# Patient Record
Sex: Male | Born: 1965 | Hispanic: Yes | Marital: Married | Smoking: Never smoker
Health system: Southern US, Community
[De-identification: ages and names within clinical notes are randomized; demographics above are authoritative.]

## PROBLEM LIST (undated history)

## (undated) DIAGNOSIS — Z9289 Personal history of other medical treatment: Secondary | ICD-10-CM

## (undated) DIAGNOSIS — M1990 Unspecified osteoarthritis, unspecified site: Secondary | ICD-10-CM

## (undated) DIAGNOSIS — Z87442 Personal history of urinary calculi: Secondary | ICD-10-CM

## (undated) DIAGNOSIS — E119 Type 2 diabetes mellitus without complications: Secondary | ICD-10-CM

## (undated) DIAGNOSIS — F419 Anxiety disorder, unspecified: Secondary | ICD-10-CM

## (undated) DIAGNOSIS — D649 Anemia, unspecified: Secondary | ICD-10-CM

## (undated) DIAGNOSIS — Z7689 Persons encountering health services in other specified circumstances: Secondary | ICD-10-CM

## (undated) DIAGNOSIS — E785 Hyperlipidemia, unspecified: Secondary | ICD-10-CM

## (undated) HISTORY — DX: Hyperlipidemia, unspecified: E78.5

## (undated) HISTORY — DX: Anemia, unspecified: D64.9

## (undated) HISTORY — PX: COLONOSCOPY: SHX174

## (undated) HISTORY — DX: Type 2 diabetes mellitus without complications: E11.9

---
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
## 2006-10-13 HISTORY — PX: COLONOSCOPY: SHX174

## 2009-02-01 LAB — HM COLONOSCOPY: HM Colonoscopy: NORMAL

## 2010-10-13 HISTORY — PX: BACK SURGERY: SHX140

## 2014-10-13 HISTORY — PX: BACK SURGERY: SHX140

## 2015-01-02 ENCOUNTER — Ambulatory Visit: Payer: Medicare Other | Attending: Orthopaedic Surgery | Admitting: Physical Therapy

## 2015-01-02 DIAGNOSIS — M542 Cervicalgia: Secondary | ICD-10-CM | POA: Insufficient documentation

## 2015-01-02 DIAGNOSIS — R531 Weakness: Secondary | ICD-10-CM | POA: Insufficient documentation

## 2015-01-02 NOTE — Patient Instructions (Signed)
AROM: Lateral Neck Flexion   Slowly tilt head toward one shoulder, then the other. Hold each position __20__ seconds. Repeat _2-3___ times per set. Do __1__ sets per session. Do _2-3___ sessions per day.  http://orth.exer.us/296   Copyright  VHI. All rights reserved.   Levator Stretch   Grasp seat or sit on hand on side to be stretched. Turn head toward other side and look down. Use hand on head to gently stretch neck in that position. Hold _20___ seconds. Repeat on other side. Repeat __2-3__ times. Do _2-3___ sessions per day.  http://gt2.exer.us/30   Copyright  VHI. All rights reserved.   Laureen Abrahams, PT, DPT 01/02/2015 2:56 PM  [place], [place] [address]  [phone] (office) [phone] (fax)
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
## 2015-01-04 ENCOUNTER — Ambulatory Visit: Payer: Medicare Other | Admitting: Physical Therapy

## 2015-01-04 DIAGNOSIS — R531 Weakness: Secondary | ICD-10-CM

## 2015-01-04 DIAGNOSIS — M542 Cervicalgia: Secondary | ICD-10-CM

## 2015-01-04 NOTE — Therapy (Signed)
[place], [place], [address] Phone: [phone]   Fax:  [phone]  Physical Therapy Evaluation  Patient Details  Name: Richard Noble MRN: [id number] Date of Birth: 1966-08-27 Referring Provider:  Marybelle Killings, MD  Encounter Date: 01/04/2015      PT End of Session - 01/04/15 0845    Visit Number 2   Number of Visits 4   Date for PT Re-Evaluation 02/01/15   PT Start Time 0800   PT Stop Time 0901   PT Time Calculation (min) 61 min   Activity Tolerance Patient tolerated treatment well   Behavior During Therapy [facility] for tasks assessed/performed      No past medical history on file.  No past surgical history on file.  There were no vitals filed for this visit.  Visit Diagnosis:  Pain in neck  Generalized weakness      Subjective Assessment - 01/04/15 0803    Symptoms Less pain this AM; about a 3/10.     Patient Stated Goals improve muscles spasms, sleep through night   Currently in Pain? Yes   Pain Score 3    Pain Location Neck   Pain Orientation Left;Posterior                       [facility] Adult PT Treatment/Exercise - 01/04/15 0804    Neck Exercises: Machines for Strengthening   UBE (Upper Arm Bike) Level 2.5 x 8 min alt 2 min forward/2 min backward   Neck Exercises: Seated   Shoulder Rolls Backwards;15 reps   Other Seated Exercise scap retraction x 15   Neck Exercises: Supine   Neck Retraction 15 reps;3 secs   Upper Extremity Flexion with Stabilization Flexion;10 reps   UE Flexion with Stabilization Limitations yellow theraband   Modalities   Modalities Traction;Electrical Stimulation;Moist Heat   Moist Heat Therapy   Number Minutes Moist Heat 15 Minutes   Moist Heat Location Other (comment)  neck   Electrical Stimulation   Electrical Stimulation Location L neck   Electrical Stimulation Action IFC to tolerance   Electrical Stimulation Goals Pain   Traction   Type of Traction Cervical   Min (lbs) 18   Max (lbs) 18   Hold Time static   Time 15   Neck Exercises: Stretches   Upper Trapezius Stretch 2 reps;20 seconds   Levator Stretch 2 reps;20 seconds                PT Education - 01/04/15 0845    Education provided Yes   Education Details HEP   Person(s) Educated Patient   Methods Explanation;Handout;Demonstration   Comprehension Verbalized understanding;Need further instruction;Returned demonstration             PT Long Term Goals - 01/04/15 0846    PT LONG TERM GOAL #1   Title independent with HEP (01/30/15)   Status On-going   PT LONG TERM GOAL #2   Title verbalize understanding of posture/body mechanics to reduce risk of reinjury (01/30/15)   Status On-going   PT LONG TERM GOAL #3   Title improve cervical ROM by at least  degrees with limited motions for improved function (01/30/15)   Status On-going   PT LONG TERM GOAL #4   Title tolerate sitting > 30 min without increase in pain (01/30/15)   Status On-going               Plan -  01/04/15 0846    Clinical Impression Statement P tolerated increase in exercises and traction well today.  No progress yet towards goals, only 2nd visit.     PT Next Visit Plan assess response to traction, review HEP and progress as indicated   Consulted and Agree with Plan of Care Patient         Problem List There are no active problems to display for this patient.  Laureen Abrahams, PT, DPT 01/04/2015 9:02 AM  [address] [place], [place], [address] Phone: [phone]   Fax:  [phone]

## 2015-01-04 NOTE — Patient Instructions (Signed)
Roll   Inhale and bring shoulders up, back, then exhale and relax shoulders down. Repeat _15__ times. Do _2-3__ times per day.  Copyright  VHI. All rights reserved.    Scapular Retraction (Standing)   With arms at sides, pinch shoulder blades together. Repeat _15___ times per set. Do _1___ sets per session. Do _2-3___ sessions per day.  http://orth.exer.us/944   Copyright  VHI. All rights reserved.   Flexibility: Neck Retraction   Lie on back.  Pull head straight back, keeping eyes and jaw level. Repeat _15___ times per set. Do __1__ sets per session. Do _2-3___ sessions per day.  http://orth.exer.us/344   Copyright  VHI. All rights reserved.   Laureen Abrahams, PT, DPT 01/04/2015 8:30 AM  [place], [place] [address]  [phone] (office) [phone] (fax)

## 2015-01-08 ENCOUNTER — Ambulatory Visit: Payer: Self-pay

## 2015-01-09 ENCOUNTER — Encounter: Payer: Self-pay | Admitting: Physical Therapy

## 2015-01-09 ENCOUNTER — Ambulatory Visit: Payer: Medicare Other | Admitting: Physical Therapy

## 2015-01-09 DIAGNOSIS — R531 Weakness: Secondary | ICD-10-CM

## 2015-01-09 DIAGNOSIS — M542 Cervicalgia: Secondary | ICD-10-CM

## 2015-01-09 NOTE — Therapy (Signed)
[place], [place], [address] Phone: [phone]   Fax:  [phone]  Physical Therapy Treatment  Patient Details  Name: Richard Noble MRN: [id number] Date of Birth: 1966/04/19 Referring Provider:  Marybelle Killings, MD  Encounter Date: 01/09/2015      PT End of Session - 01/09/15 1558    Visit Number 3   Number of Visits 4   Date for PT Re-Evaluation 02/01/15   PT Start Time 1324   PT Stop Time 1552   PT Time Calculation (min) 67 min   Activity Tolerance Patient tolerated treatment well   Behavior During Therapy [facility] for tasks assessed/performed      History reviewed. No pertinent past medical history.  History reviewed. No pertinent past surgical history.  There were no vitals filed for this visit.  Visit Diagnosis:  Pain in neck  Generalized weakness      Subjective Assessment - 01/09/15 1450    Symptoms I'm ok. I take the Tramadol.   Currently in Pain? Yes   Pain Score 4                        [facility] Adult PT Treatment/Exercise - 01/09/15 0001    Exercises   Exercises Neck;Shoulder   Neck Exercises: Machines for Strengthening   UBE (Upper Arm Bike) 6 minutes 69fwd/3bk   Neck Exercises: Standing   Neck Retraction 5 reps   Other Standing Exercises 5# overhead press with touch  2x15   Shoulder Exercises: Seated   Row 15 reps  2 sets   Row Weight (lbs) 25   Other Seated Exercises lat pull 25# 2x15   Shoulder Exercises: Standing   Extension 10 reps  2 sets   Extension Weight (lbs) 10   Shoulder Exercises: Stretch   Corner Stretch 3 reps;10 seconds   Other Shoulder Stretches postural reset at wall   Modalities   Modalities Traction;Electrical Stimulation;Moist Heat   Moist Heat Therapy   Number Minutes Moist Heat 15 Minutes   Moist Heat Location Other (comment)  cervical   Electrical Stimulation   Electrical Stimulation Location cervical   Electrical  Stimulation Parameters IFC   Electrical Stimulation Goals Pain   Traction   Type of Traction Cervical   Min (lbs) 18   Max (lbs) 18   Hold Time static   Time 15                PT Education - 01/09/15 1519    Education provided Yes   Education Details posture, lifting, body mechanics   Person(s) Educated Patient   Methods Explanation;Demonstration   Comprehension Verbalized understanding             PT Long Term Goals - 01/09/15 1501    PT LONG TERM GOAL #1   Title independent with HEP (01/30/15)   Time 4   Period Weeks   Status Achieved   PT LONG TERM GOAL #2   Title verbalize understanding of posture/body mechanics to reduce risk of reinjury (01/30/15)   Time 4   Period Weeks   Status Achieved   PT LONG TERM GOAL #3   Title improve cervical ROM by at least  degrees with limited motions for improved function (01/30/15)   Time 4   Period Weeks   Status On-going   PT LONG TERM GOAL #4   Title tolerate sitting > 30 min without increase in pain (  01/30/15)   Time 4   Period Weeks   Status On-going               Problem List There are no active problems to display for this patient.   Jaemarie Hochberg PTA 01/09/2015, 4:01 PM  [place] [address] [place], [place], [address] Phone: [phone]   Fax:  [phone]

## 2015-01-10 ENCOUNTER — Ambulatory Visit: Payer: Medicare Other | Admitting: Physical Therapy

## 2015-01-17 ENCOUNTER — Ambulatory Visit: Payer: Medicare Other | Attending: Orthopaedic Surgery | Admitting: Physical Therapy

## 2015-01-17 ENCOUNTER — Encounter: Payer: Self-pay | Admitting: Physical Therapy

## 2015-01-17 DIAGNOSIS — R531 Weakness: Secondary | ICD-10-CM | POA: Insufficient documentation

## 2015-01-17 DIAGNOSIS — M542 Cervicalgia: Secondary | ICD-10-CM | POA: Diagnosis present

## 2015-01-18 NOTE — Therapy (Signed)
[place], [place], [address] Phone: [phone]   Fax:  [phone]  Physical Therapy Treatment  Patient Details  Name: Richard Noble MRN: [id number] Date of Birth: 09-Mar-1966 Referring Provider:  Marybelle Killings, MD  Encounter Date: 01/17/2015    History reviewed. No pertinent past medical history.  History reviewed. No pertinent past surgical history.  There were no vitals filed for this visit.  Visit Diagnosis:  Neck pain      Subjective Assessment - 01/17/15 1014    Subjective I still feel pressure in the neck and thoracic area.  Maybe some relief with the traction   Diagnostic tests will have MRI in the next week   Currently in Pain? Yes   Pain Score 4    Pain Location Neck   Pain Orientation Left   Pain Descriptors / Indicators Tightness;Pressure   Pain Frequency Intermittent   Aggravating Factors  worse at night trying to sleep   Pain Relieving Factors pain meds   Effect of Pain on Daily Activities sleeping is really disturbed                                    PT Long Term Goals - 01/09/15 1501    PT LONG TERM GOAL #1   Title independent with HEP (01/30/15)   Time 4   Period Weeks   Status Achieved   PT LONG TERM GOAL #2   Title verbalize understanding of posture/body mechanics to reduce risk of reinjury (01/30/15)   Time 4   Period Weeks   Status Achieved   PT LONG TERM GOAL #3   Title improve cervical ROM by at least  degrees with limited motions for improved function (01/30/15)   Time 4   Period Weeks   Status On-going   PT LONG TERM GOAL #4   Title tolerate sitting > 30 min without increase in pain (01/30/15)   Time 4   Period Weeks   Status On-going               Problem List There are no active problems to display for this patient.   Sumner Boast, PT 01/18/2015, 2:06 PM  [address] [address] [place], [place], [address] Phone: [phone]   Fax:  [phone]

## 2015-01-23 ENCOUNTER — Other Ambulatory Visit: Payer: Self-pay | Admitting: Orthopaedic Surgery

## 2015-01-23 DIAGNOSIS — M542 Cervicalgia: Secondary | ICD-10-CM

## 2015-01-24 ENCOUNTER — Ambulatory Visit: Payer: Medicare Other | Admitting: Physical Therapy

## 2015-01-24 DIAGNOSIS — R531 Weakness: Secondary | ICD-10-CM

## 2015-01-24 DIAGNOSIS — M542 Cervicalgia: Secondary | ICD-10-CM

## 2015-01-24 NOTE — Therapy (Signed)
[place], [place], [address] Phone: [phone]   Fax:  [phone]  Physical Therapy Treatment  Patient Details  Name: Richard Noble MRN: [id number] Date of Birth: 01/15/1966 Referring Provider:  Marybelle Killings, MD  Encounter Date: 01/24/2015      PT End of Session - 01/24/15 1225    Visit Number 4   Number of Visits 5   Date for PT Re-Evaluation 02/01/15   PT Start Time 1055   PT Stop Time 1142   PT Time Calculation (min) 47 min   Activity Tolerance Patient tolerated treatment well   Behavior During Therapy [facility] for tasks assessed/performed      No past medical history on file.  No past surgical history on file.  There were no vitals filed for this visit.  Visit Diagnosis:  Neck pain  Pain in neck  Generalized weakness      Subjective Assessment - 01/24/15 1050    Subjective Ultrasound/estim combo was wonderful.  Provided "temporary relief."   Patient Stated Goals improve muscles spasms, sleep through night   Currently in Pain? Yes   Pain Score 4    Pain Location Neck   Pain Orientation Left   Pain Descriptors / Indicators Tightness;Pressure   Pain Type Chronic pain   Pain Onset More than a month ago   Pain Frequency Intermittent                       [facility] Adult PT Treatment/Exercise - 01/24/15 1056    Neck Exercises: Machines for Strengthening   UBE (Upper Arm Bike) 6 minutes 19fwd/3bk level 3.0   Cybex Row 25# 3x10   Other Machines for Strengthening lat pull downs 25# 3x10   Shoulder Exercises: Standing   Protraction Strengthening;Both;20 reps;Theraband   Theraband Level (Shoulder Protraction) Level 2 (Red)   Extension Strengthening;Both;20 reps;Theraband   Theraband Level (Shoulder Extension) Level 2 (Red)   Retraction Strengthening;Left;20 reps;Theraband   Theraband Level (Shoulder Retraction) Level 2 (Red)   Modalities   Modalities Ultrasound   Electrical Stimulation   Electrical Stimulation Location cervical   Electrical Stimulation Action estim/US combo   Electrical Stimulation Parameters to tolerance   Electrical Stimulation Goals Pain   Ultrasound   Ultrasound Location L UT   Ultrasound Parameters 100% DC, 1 mHz, 1.2 w/cm2 x 9 min with estim combo   Ultrasound Goals Pain   Traction   Time attempted cervical traction however unable to get traction unit to maintain pull                PT Education - 01/24/15 1229    Education provided Yes   Education Details goals of care and indications for continuing PT v/s d/c-pt with limited progress so far and educated on indications for continued PT   Person(s) Educated Patient   Methods Explanation   Comprehension Verbalized understanding             PT Long Term Goals - 01/09/15 1501    PT LONG TERM GOAL #1   Title independent with HEP (01/30/15)   Time 4   Period Weeks   Status Achieved   PT LONG TERM GOAL #2   Title verbalize understanding of posture/body mechanics to reduce risk of reinjury (01/30/15)   Time 4   Period Weeks   Status Achieved   PT LONG TERM GOAL #3   Title improve cervical ROM by at  least  degrees with limited motions for improved function (01/30/15)   Time 4   Period Weeks   Status On-going   PT LONG TERM GOAL #4   Title tolerate sitting > 30 min without increase in pain (01/30/15)   Time 4   Period Weeks   Status On-going               Plan - 01/24/15 1226    Clinical Impression Statement Pt reports significant relief in pain following estim/US combo however relief is only temporary.  Pt with MRI next week and will determine whether to continue PT v/c d/c depending on results.  Pt also reports no significant change in pain following 4 PT sessions therefore may consider d/c next visit if pt has made no improvement.   PT Next Visit Plan assess response to traction, review HEP and progress as indicated; d/c PT next visit if no  improvement        Problem List There are no active problems to display for this patient.  Laureen Abrahams, PT, DPT 01/24/2015 12:32 PM  [place] [address] [place] [address] [place], [place], [address] Phone: [phone]   Fax:  [phone]

## 2015-01-30 ENCOUNTER — Ambulatory Visit
Admission: RE | Admit: 2015-01-30 | Discharge: 2015-01-30 | Disposition: A | Discharge status: Home / Self Care | Payer: Medicare Other | Source: Ambulatory Visit | Attending: Orthopaedic Surgery | Admitting: Orthopaedic Surgery

## 2015-01-30 DIAGNOSIS — M542 Cervicalgia: Secondary | ICD-10-CM

## 2015-02-01 LAB — HEPATIC FUNCTION PANEL
ALT: 23 U/L (ref 10–40)
AST: 19 U/L (ref 14–40)
Alkaline Phosphatase: 30 U/L (ref 25–125)
Bilirubin, Total: 1.4 mg/dL

## 2015-02-01 LAB — BASIC METABOLIC PANEL
BUN: 19 mg/dL (ref 4–21)
Creatinine: 0.7 mg/dL (ref <=1.3)
Glucose: 84 mg/dL
Potassium: 4 mmol/L (ref 3.4–5.3)
Sodium: 139 mmol/L (ref 137–147)

## 2015-02-01 LAB — CBC AND DIFFERENTIAL
HCT: 41 % (ref 41–53)
Hemoglobin: 13.5 g/dL (ref 13.5–17.5)
Platelets: 249 10*3/uL (ref 150–399)
WBC: 5.5 10^3/mL

## 2015-02-01 LAB — HEMOGLOBIN A1C: Hemoglobin A1C: 6

## 2015-02-01 LAB — LIPID PANEL
Cholesterol: 138 mg/dL (ref 0–200)
HDL: 28 mg/dL — AB (ref 35–70)
LDL Cholesterol: 93 mg/dL
Triglycerides: 85 mg/dL (ref 40–160)

## 2015-02-02 LAB — BASIC METABOLIC PANEL
BUN: 19 mg/dL (ref 4–21)
Creatinine: 0.7 mg/dL (ref 0.6–1.3)
Glucose: 84 mg/dL
Potassium: 4 mmol/L (ref 3.4–5.3)
Sodium: 139 mmol/L (ref 137–147)

## 2015-02-02 LAB — LIPID PANEL
Cholesterol: 138 mg/dL (ref 0–200)
HDL: 28 mg/dL — AB (ref 35–70)
LDL Cholesterol: 93 mg/dL
Triglycerides: 85 mg/dL (ref 40–160)

## 2015-02-02 LAB — CBC AND DIFFERENTIAL
HCT: 41 % (ref 41–53)
Hemoglobin: 13.5 g/dL (ref 13.5–17.5)
Platelets: 249 10*3/uL (ref 150–399)
WBC: 5.5 10^3/mL

## 2015-02-02 LAB — HEPATIC FUNCTION PANEL
ALT: 23 U/L (ref 10–40)
AST: 19 U/L (ref 14–40)
Alkaline Phosphatase: 30 U/L (ref 25–125)
Bilirubin, Total: 1.4 mg/dL

## 2015-02-02 LAB — HEMOGLOBIN A1C: Hgb A1c MFr Bld: 6 % (ref 4.0–6.0)

## 2015-02-22 ENCOUNTER — Other Ambulatory Visit (HOSPITAL_COMMUNITY): Payer: Self-pay | Admitting: Orthopaedic Surgery

## 2015-03-14 ENCOUNTER — Encounter (HOSPITAL_COMMUNITY): Admission: RE | Payer: Self-pay | Source: Ambulatory Visit

## 2015-03-14 ENCOUNTER — Inpatient Hospital Stay (HOSPITAL_COMMUNITY): Admission: RE | Admit: 2015-03-14 | Payer: Medicare Other | Source: Ambulatory Visit | Admitting: Orthopaedic Surgery

## 2015-03-14 SURGERY — ANTERIOR CERVICAL DECOMPRESSION/DISCECTOMY FUSION 2 LEVELS
Anesthesia: General

## 2015-07-20 ENCOUNTER — Ambulatory Visit: Payer: Medicare Other | Attending: Nurse Practitioner | Admitting: Rehabilitative and Restorative Service Providers"

## 2015-07-20 DIAGNOSIS — R293 Abnormal posture: Secondary | ICD-10-CM | POA: Diagnosis present

## 2015-07-20 DIAGNOSIS — R531 Weakness: Secondary | ICD-10-CM | POA: Diagnosis present

## 2015-07-20 DIAGNOSIS — M542 Cervicalgia: Secondary | ICD-10-CM

## 2015-07-20 NOTE — Patient Instructions (Signed)
Thoracic Self-Mobilization (Supine)    With rolled towel placed lengthwise at lower ribs level, lie back on towel with arms outstretched. Hold __2 minutes. Relax. Repeat _1___ times per set. Do ___2_ sessions per day.  http://orth.exer.us/1000   Copyright  VHI. All rights reserved.  Pendulum Circular    Bend forward at waist, leaning on table for support. Rock body in a circular pattern to move arm clockwise __10__ times then counterclockwise __10__ times. Do __2__ sessions per day.  Copyright  VHI. All rights reserved.    Active Neck Rotation    With head in a comfortable position and chin gently tucked in, rotate head to the right. Hold __5__ seconds. Repeat to the left. Repeat __10__ times. Do _2___ sessions per day.  http://gt2.exer.us/10   Copyright  VHI. All rights reserved.  Healthy Back - Shoulder Roll     In front of a mirror, do not drop your head. Stand straight with arms relaxed at sides. Roll shoulders backward continuously. Do __10__ times.   Copyright  VHI. All rights reserved.
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
## 2015-07-24 ENCOUNTER — Ambulatory Visit: Payer: Medicare Other | Admitting: Physical Therapy

## 2015-07-25 ENCOUNTER — Encounter: Payer: Self-pay | Admitting: Physical Therapy

## 2015-07-25 ENCOUNTER — Ambulatory Visit: Payer: Medicare Other | Admitting: Physical Therapy

## 2015-07-25 DIAGNOSIS — R531 Weakness: Secondary | ICD-10-CM

## 2015-07-25 DIAGNOSIS — M542 Cervicalgia: Secondary | ICD-10-CM | POA: Diagnosis not present

## 2015-07-25 DIAGNOSIS — R293 Abnormal posture: Secondary | ICD-10-CM

## 2015-07-25 NOTE — Therapy (Signed)
[facility] [address] [place], [place], [address] Phone: [phone]   Fax:  [phone]  Physical Therapy Treatment  Patient Details  Name: Richard Noble MRN: [id number] Date of Birth: [birth year] Referring Provider:  Mardi Mainland,*  Encounter Date: 07/25/2015      PT End of Session - 07/25/15 0817    Visit Number 2   Number of Visits 16   Date for PT Re-Evaluation 09/17/15   Authorization Type G code every 10th visit   PT Start Time 0812   PT Stop Time 0852   PT Time Calculation (min) 40 min   Activity Tolerance Patient limited by pain   Behavior During Therapy Restless  patient changes positions during eval due to pain      History reviewed. No pertinent past medical history.  History reviewed. No pertinent past surgical history.  There were no vitals filed for this visit.  Visit Diagnosis:  Neck pain  Posture abnormality  Pain in neck  Generalized weakness      Subjective Assessment - 07/25/15 0813    Subjective Reports his neck feels worse today, out of pain medication. Doing stretches up to 3x's a day, however can't tell if helping or not due to not having his usual pain medication.   Currently in Pain? Yes   Pain Score 9    Pain Location Neck   Pain Orientation Left   Pain Descriptors / Indicators Aching;Stabbing   Pain Type Chronic pain   Pain Onset More than a month ago   Pain Frequency Constant   Aggravating Factors  activity, household activities.   Pain Relieving Factors pain medicine- Tramadol;heat,stretching     Treatment:  Manual therapy: to bil cervical paraspinals, left upper trap/rhomboid/subscapular area - soft tissue mobs - gentle distraction - trigger point release - positional release Pt able to tolerate progressively increased pressure to release trigger points along left scapula.   Moist hot pack concurrent with estim below to left  shoulder/cervical/scapular area.  IFC estim to left scapular area,x 10 min, intensity to tolerance.         PT Short Term Goals - 07/20/15 1458    PT SHORT TERM GOAL #1   Title The patient will be indep with HEP for scapular mobilization, postural stretching, and gentle stabilization.   Baseline Target date 08/19/2015   Time 4   Period Weeks   PT SHORT TERM GOAL #2   Title The patient will report pain baseline < or equal to 6/10 without medication mgmt.   Baseline Target date 08/19/2015   Time 4   Period Weeks   PT SHORT TERM GOAL #3   Title The patient will improve cervical A/ROM rotation to > or equal to 60 deg R and 75 deg L   Baseline Target date 08/19/2015   Time 4   Period Weeks   PT SHORT TERM GOAL #4   Title The patient will improve bilateral cervical sidebending to > or equal to 25 degrees.   Baseline Target date 08/19/2015   Time 4   Period Weeks           PT Long Term Goals - 07/20/15 1502    PT LONG TERM GOAL #1   Title The patient will verbalize understanding of sleeping positions for improved spinal support/comfort.   Baseline Target date 09/17/2015   Time 8   Period Weeks   PT LONG TERM GOAL #2   Title The patient will verbalize  community program for post d/c progression of strengthening.   Baseline Target date 09/17/2015   Time 8   Period Weeks   PT LONG TERM GOAL #3   Title The patient will improve cervical flexion to > or equal to 25 degrees.   Baseline Target date 09/17/2015   Time 8   Period Weeks   PT LONG TERM GOAL #4   Title The patient will improve L UE strength to 5/5.   Baseline Target date 09/17/2015   Time 8   Period Weeks   PT LONG TERM GOAL #5   Title The patient will report reduced pain to < or equal to 4/10 at rest.   Baseline Target date 09/17/2015   Time 8   Period Weeks           Plan - 07/25/15 4680    Clinical Impression Statement Focused on pain relief today and decreased muscle tightness today. Reviewed sleeping  postions for proper alignment, pt already utilizing great techniques. Pt making steady progress toward goals.   Pt will benefit from skilled therapeutic intervention in order to improve on the following deficits Decreased strength;Decreased mobility;Postural dysfunction;Impaired flexibility;Decreased activity tolerance;Decreased range of motion;Increased muscle spasms   Rehab Potential Good   Clinical Impairments Affecting Rehab Potential prior PT with only short term improvements/ no carryover in past (pre-laser surgery).   PT Frequency 2x / week   PT Duration 8 weeks   PT Treatment/Interventions ADLs/Self Care Home Management;Neuromuscular re-education;Electrical Stimulation;Moist Heat;Therapeutic activities;Therapeutic exercise;Manual techniques;Functional mobility training;Patient/family education;Ultrasound;Cryotherapy;Taping   PT Next Visit Plan progress parascapular mobility, postural stabilization (begin gentle due to pain), pain mgmt, advance HEP with gentle strengthening exercises/mobility exercises   Consulted and Agree with Plan of Care Patient        Problem List There are no active problems to display for this patient.   [name] 07/25/2015, 9:28 AM  [name], PTA, [facility] [name], [place], [place] [address] [phone] 07/25/2015, 9:29 AM

## 2015-07-27 ENCOUNTER — Ambulatory Visit: Payer: Medicare Other | Admitting: Physical Therapy

## 2015-07-27 ENCOUNTER — Encounter: Payer: Self-pay | Admitting: Physical Therapy

## 2015-07-27 DIAGNOSIS — R531 Weakness: Secondary | ICD-10-CM

## 2015-07-27 DIAGNOSIS — M542 Cervicalgia: Secondary | ICD-10-CM | POA: Diagnosis not present

## 2015-07-27 DIAGNOSIS — R293 Abnormal posture: Secondary | ICD-10-CM

## 2015-07-27 NOTE — Therapy (Signed)
[facility] [address] [place], [place], [address] Phone: [phone]   Fax:  [phone]  Physical Therapy Treatment  Patient Details  Name: Richard Noble MRN: [id number] Date of Birth: 1966-04-24 No Data Recorded  Encounter Date: 07/27/2015    07/27/15 0807  PT Visits / Re-Eval  Visit Number 3  Number of Visits 16  Date for PT Re-Evaluation 09/17/15  Authorization  Authorization Type G code every 10th visit  PT Time Calculation  PT Start Time 0802  PT Stop Time 0845  PT Time Calculation (min) 43 min  PT - End of Session  Activity Tolerance Patient limited by pain  Behavior During Therapy [facility] for tasks assessed/performed    History reviewed. No pertinent past medical history.  History reviewed. No pertinent past surgical history.  There were no vitals filed for this visit.  Visit Diagnosis:   Neck pain    Posture abnormality    Pain in neck     Generalized weakness         Subjective Assessment - 07/27/15 0806    Subjective Better today, back on his tramadol. See's surgeon this pm to also have neck looked at. No falls.   Currently in Pain? Yes   Pain Score 3    Pain Location Neck   Pain Orientation Left   Pain Descriptors / Indicators Aching;Sore;Stabbing   Pain Type Chronic pain   Pain Onset More than a month ago   Pain Frequency Constant   Aggravating Factors  increased activity, house hold activities   Pain Relieving Factors tramadol, heat, stretching     Treatment: Red pball at wall - rolling up/down with flexion stretch x 10 reps - circles both ways x 10 reps - pushups with emphasis on scapular stability x 10 reps   Red theraband - shoulder horizontal abduction  X 10 reps - alternating diagonals x 10 reps each way  IFC electrical stimulation concurrent with moist hot pack to left scapula area  X 15 minutes, intensity to tolerance at end of session for decreased pain and  decreased muscle tightness.        PT Short Term Goals - 07/20/15 1458    PT SHORT TERM GOAL #1   Title The patient will be indep with HEP for scapular mobilization, postural stretching, and gentle stabilization.   Baseline Target date 08/19/2015   Time 4   Period Weeks   PT SHORT TERM GOAL #2   Title The patient will report pain baseline < or equal to 6/10 without medication mgmt.   Baseline Target date 08/19/2015   Time 4   Period Weeks   PT SHORT TERM GOAL #3   Title The patient will improve cervical A/ROM rotation to > or equal to 60 deg R and 75 deg L   Baseline Target date 08/19/2015   Time 4   Period Weeks   PT SHORT TERM GOAL #4   Title The patient will improve bilateral cervical sidebending to > or equal to 25 degrees.   Baseline Target date 08/19/2015   Time 4   Period Weeks           PT Long Term Goals - 07/20/15 1502    PT LONG TERM GOAL #1   Title The patient will verbalize understanding of sleeping positions for improved spinal support/comfort.   Baseline Target date 09/17/2015   Time 8   Period Weeks   PT LONG TERM GOAL #2   Title  The patient will verbalize community program for post d/c progression of strengthening.   Baseline Target date 09/17/2015   Time 8   Period Weeks   PT LONG TERM GOAL #3   Title The patient will improve cervical flexion to > or equal to 25 degrees.   Baseline Target date 09/17/2015   Time 8   Period Weeks   PT LONG TERM GOAL #4   Title The patient will improve L UE strength to 5/5.   Baseline Target date 09/17/2015   Time 8   Period Weeks   PT LONG TERM GOAL #5   Title The patient will report reduced pain to < or equal to 4/10 at rest.   Baseline Target date 09/17/2015   Time 8   Period Weeks        07/27/15 0808  Plan  Clinical Impression Statement Pt with less pain today, added strengthening and postural execises with no issues reported. Pt still with increased muscle tightness and guarding noted with exercises so ended  session with estim/moist heat. Pt making steady progress toaward goals.  Pt will benefit from skilled therapeutic intervention in order to improve on the following deficits Decreased strength;Decreased mobility;Postural dysfunction;Impaired flexibility;Decreased activity tolerance;Decreased range of motion;Increased muscle spasms  Rehab Potential Good  Clinical Impairments Affecting Rehab Potential prior PT with only short term improvements/ no carryover in past (pre-laser surgery).  PT Frequency 2x / week  PT Duration 8 weeks  PT Treatment/Interventions ADLs/Self Care Home Management;Neuromuscular re-education;Electrical Stimulation;Moist Heat;Therapeutic activities;Therapeutic exercise;Manual techniques;Functional mobility training;Patient/family education;Ultrasound;Cryotherapy;Taping  PT Next Visit Plan progress parascapular mobility, postural stabilization (begin gentle due to pain), pain mgmt, advance HEP with gentle strengthening exercises/mobility exercises  Consulted and Agree with Plan of Care Patient     Problem List There are no active problems to display for this patient.   [name] 07/27/2015, 8:08 AM  [name], PTA, [facility] [address], [place], [place] [address] [phone] 07/27/2015, 8:09 AM  Name: Richard Noble MRN: [id number] Date of Birth: 1966/04/15

## 2015-07-30 ENCOUNTER — Ambulatory Visit: Payer: Medicare Other | Admitting: Physical Therapy

## 2015-07-30 DIAGNOSIS — M542 Cervicalgia: Secondary | ICD-10-CM

## 2015-07-30 DIAGNOSIS — R293 Abnormal posture: Secondary | ICD-10-CM

## 2015-07-30 DIAGNOSIS — R531 Weakness: Secondary | ICD-10-CM

## 2015-07-30 NOTE — Therapy (Signed)
[facility] [address] [place], [place], [address] Phone: [phone]   Fax:  [phone]  Physical Therapy Treatment  Patient Details  Name: Richard Noble MRN: [id number] Date of Birth: May 08, 1966 No Data Recorded  Encounter Date: 07/30/2015      PT End of Session - 07/30/15 0808    Visit Number 4   Number of Visits 16   Date for PT Re-Evaluation 09/17/15   Authorization Type G code every 10th visit   PT Start Time 0804   PT Stop Time 0845   PT Time Calculation (min) 41 min   Activity Tolerance Patient limited by pain   Behavior During Therapy [facility] for tasks assessed/performed      No past medical history on file.  No past surgical history on file.  There were no vitals filed for this visit.  Visit Diagnosis:  Neck pain  Posture abnormality  Pain in neck  Generalized weakness      Subjective Assessment - 07/30/15 0807    Subjective No new complaints. No falls. Saw Dr Lorin [facility] Friday who has requessted he not come back to him since having the laser surgery, for either the neck or back pain. He is now looking for a new Md to follow him for his neck and back pain. Has contacted the MD in [place] who has agreed to manage his care until he finds a local doctor.                    Currently in Pain? Yes   Pain Score 2    Pain Location Neck   Pain Descriptors / Indicators Aching;Sore   Pain Type Chronic pain   Pain Onset More than a month ago   Pain Frequency Constant   Aggravating Factors  Increased activity, house hold activities   Pain Relieving Factors tramadol, heat, stretching     Treatment: Exercises: UBE level 2.0 x 2 minutes each fwd/bwd  Red pball at wall: cues on posture and ex form Rolling up/down with flexion stretch at top x 10 reps Circles x 10 both ways with emphasis on scapular stability Pushups x 10 reps  With red theraband: cues on posture and ex form - horizontal abduction x 10  reps with 5 sec holds - alternating diagonal's x 10 each way  Prone on mat: 10 reps each bil sides simultaneously with cues on form and neutral cervical position. - superman - aquaman - birdman  IFC estim concurrent with moist hot pack to left scapula area x 15 minutes, intensity to tolerance, for pain relief and decreased muscle tightness after exercises.         PT Short Term Goals - 07/20/15 1458    PT SHORT TERM GOAL #1   Title The patient will be indep with HEP for scapular mobilization, postural stretching, and gentle stabilization.   Baseline Target date 08/19/2015   Time 4   Period Weeks   PT SHORT TERM GOAL #2   Title The patient will report pain baseline < or equal to 6/10 without medication mgmt.   Baseline Target date 08/19/2015   Time 4   Period Weeks   PT SHORT TERM GOAL #3   Title The patient will improve cervical A/ROM rotation to > or equal to 60 deg R and 75 deg L   Baseline Target date 08/19/2015   Time 4   Period Weeks   PT SHORT TERM GOAL #4   Title The  patient will improve bilateral cervical sidebending to > or equal to 25 degrees.   Baseline Target date 08/19/2015   Time 4   Period Weeks           PT Long Term Goals - 07/20/15 1502    PT LONG TERM GOAL #1   Title The patient will verbalize understanding of sleeping positions for improved spinal support/comfort.   Baseline Target date 09/17/2015   Time 8   Period Weeks   PT LONG TERM GOAL #2   Title The patient will verbalize community program for post d/c progression of strengthening.   Baseline Target date 09/17/2015   Time 8   Period Weeks   PT LONG TERM GOAL #3   Title The patient will improve cervical flexion to > or equal to 25 degrees.   Baseline Target date 09/17/2015   Time 8   Period Weeks   PT LONG TERM GOAL #4   Title The patient will improve L UE strength to 5/5.   Baseline Target date 09/17/2015   Time 8   Period Weeks   PT LONG TERM GOAL #5   Title The patient will report  reduced pain to < or equal to 4/10 at rest.   Baseline Target date 09/17/2015   Time 8   Period Weeks           Plan - 07/30/15 7948    Clinical Impression Statement Continues to tolerate gentle strengtheing exercises without increased pain, only reports increased tightness afterwards. Pt now has home TENS unit, plans to bring it in next session for instructions on use. Progressing well towards goals.   Pt will benefit from skilled therapeutic intervention in order to improve on the following deficits Decreased strength;Decreased mobility;Postural dysfunction;Impaired flexibility;Decreased activity tolerance;Decreased range of motion;Increased muscle spasms   Rehab Potential Good   Clinical Impairments Affecting Rehab Potential prior PT with only short term improvements/ no carryover in past (pre-laser surgery).   PT Frequency 2x / week   PT Duration 8 weeks   PT Treatment/Interventions ADLs/Self Care Home Management;Neuromuscular re-education;Electrical Stimulation;Moist Heat;Therapeutic activities;Therapeutic exercise;Manual techniques;Functional mobility training;Patient/family education;Ultrasound;Cryotherapy;Taping   PT Next Visit Plan progress parascapular mobility, postural stabilization (begin gentle due to pain), pain mgmt, advance HEP with gentle strengthening exercises/mobility exercises as needed   Consulted and Agree with Plan of Care Patient        Problem List There are no active problems to display for this patient.   [name] 07/30/2015, 12:59 PM  [name], PTA, [facility] [address], [place], [place] [address] [phone] 07/30/2015, 12:59 PM   Name: Richard Noble MRN: [id number] Date of Birth: 04/17/1966

## 2015-08-01 ENCOUNTER — Ambulatory Visit: Payer: Medicare Other | Admitting: Physical Therapy

## 2015-08-01 ENCOUNTER — Encounter: Payer: Self-pay | Admitting: Physical Therapy

## 2015-08-01 DIAGNOSIS — R531 Weakness: Secondary | ICD-10-CM

## 2015-08-01 DIAGNOSIS — M542 Cervicalgia: Secondary | ICD-10-CM

## 2015-08-01 DIAGNOSIS — R293 Abnormal posture: Secondary | ICD-10-CM

## 2015-08-01 NOTE — Therapy (Signed)
[facility] [address] [place], [place], [address] Phone: [phone]   Fax:  [phone]  Physical Therapy Treatment  Patient Details  Name: Richard Noble MRN: [id number] Date of Birth: [birth year] No Data Recorded  Encounter Date: 08/01/2015      PT End of Session - 08/01/15 8841    Visit Number 5   Number of Visits 16   Date for PT Re-Evaluation 09/17/15   Authorization Type G code every 10th visit   PT Start Time 0805   PT Stop Time 0853   PT Time Calculation (min) 48 min   Activity Tolerance Patient limited by pain   Behavior During Therapy [facility] for tasks assessed/performed      History reviewed. No pertinent past medical history.  History reviewed. No pertinent past surgical history.  There were no vitals filed for this visit.  Visit Diagnosis:  Neck pain  Posture abnormality  Pain in neck  Generalized weakness      Subjective Assessment - 08/01/15 0810    Subjective No new complaints. Was really sore night after last session. Home TENS unit broke so he has returned it to [facility] and is waiting on the replacement unit. See's his primary md tomorrow as he is having increased pain with spreading out the Tramadol to only 2 a day vs the 3 a day he is used to taking to get relief.                          Pain Score 6    Pain Location Neck   Pain Orientation Left   Pain Descriptors / Indicators Aching;Sharp;Sore   Pain Type Chronic pain   Pain Onset More than a month ago   Pain Frequency Constant   Aggravating Factors  increased activity, house hold activities   Pain Relieving Factors tramadol, heat, stretching     Treatment: Exercises: UBE level 2.0 x 2 minutes each fwd/bwd  Red pball at wall: cues on posture and ex form Rolling up/down with flexion stretch at top x 12 reps Circles x 12 both ways with emphasis on scapular stability Pushups x 12 reps  Back to ball at wall:  - using 2#  hand weights Alternating UE raises x 10 each side "V" x 10 reps "W" x 10 reps  - With red theraband: cues on posture and ex form horizontal abduction x 10 reps with 5 sec holds alternating diagonal's x 10 each way  Prone on mat: 10 reps each bil sides simultaneously with cues on form and neutral cervical position. - superman - aquaman - birdman  IFC estim concurrent with moist hot pack to left scapula area x 15 minutes, intensity to tolerance, for pain relief and decreased muscle tightness after exercises        PT Short Term Goals - 07/20/15 1458    PT SHORT TERM GOAL #1   Title The patient will be indep with HEP for scapular mobilization, postural stretching, and gentle stabilization.   Baseline Target date 08/19/2015   Time 4   Period Weeks   PT SHORT TERM GOAL #2   Title The patient will report pain baseline < or equal to 6/10 without medication mgmt.   Baseline Target date 08/19/2015   Time 4   Period Weeks   PT SHORT TERM GOAL #3   Title The patient will improve cervical A/ROM rotation to > or equal to 60 deg R and  75 deg L   Baseline Target date 08/19/2015   Time 4   Period Weeks   PT SHORT TERM GOAL #4   Title The patient will improve bilateral cervical sidebending to > or equal to 25 degrees.   Baseline Target date 08/19/2015   Time 4   Period Weeks           PT Long Term Goals - 07/20/15 1502    PT LONG TERM GOAL #1   Title The patient will verbalize understanding of sleeping positions for improved spinal support/comfort.   Baseline Target date 09/17/2015   Time 8   Period Weeks   PT LONG TERM GOAL #2   Title The patient will verbalize community program for post d/c progression of strengthening.   Baseline Target date 09/17/2015   Time 8   Period Weeks   PT LONG TERM GOAL #3   Title The patient will improve cervical flexion to > or equal to 25 degrees.   Baseline Target date 09/17/2015   Time 8   Period Weeks   PT LONG TERM GOAL #4   Title The  patient will improve L UE strength to 5/5.   Baseline Target date 09/17/2015   Time 8   Period Weeks   PT LONG TERM GOAL #5   Title The patient will report reduced pain to < or equal to 4/10 at rest.   Baseline Target date 09/17/2015   Time 8   Period Weeks           Plan - 08/01/15 8563    Clinical Impression Statement Continue to focus on gentle strengthening and pain reduction. Pt progressing slowly towards goals.   Pt will benefit from skilled therapeutic intervention in order to improve on the following deficits Decreased strength;Decreased mobility;Postural dysfunction;Impaired flexibility;Decreased activity tolerance;Decreased range of motion;Increased muscle spasms   Rehab Potential Good   Clinical Impairments Affecting Rehab Potential prior PT with only short term improvements/ no carryover in past (pre-laser surgery).   PT Frequency 2x / week   PT Duration 8 weeks   PT Treatment/Interventions ADLs/Self Care Home Management;Neuromuscular re-education;Electrical Stimulation;Moist Heat;Therapeutic activities;Therapeutic exercise;Manual techniques;Functional mobility training;Patient/family education;Ultrasound;Cryotherapy;Taping   PT Next Visit Plan progress parascapular mobility, postural stabilization (begin gentle due to pain), pain mgmt, advance HEP with gentle strengthening exercises/mobility exercises as needed   Consulted and Agree with Plan of Care Patient        Problem List There are no active problems to display for this patient.   [name] 08/01/2015, 2:44 PM  [name], PTA, [facility] [address], [place], [place] [address] [phone] 08/01/2015, 2:44 PM   Name: Richard Noble MRN: [id number] Date of Birth: 03-11-1966

## 2015-08-06 ENCOUNTER — Other Ambulatory Visit: Payer: Self-pay | Admitting: Orthopedic Surgery

## 2015-08-06 ENCOUNTER — Encounter: Payer: Self-pay | Admitting: Physical Therapy

## 2015-08-06 ENCOUNTER — Ambulatory Visit: Payer: Medicare Other | Admitting: Physical Therapy

## 2015-08-06 ENCOUNTER — Other Ambulatory Visit: Payer: Self-pay | Admitting: *Deleted

## 2015-08-06 DIAGNOSIS — R531 Weakness: Secondary | ICD-10-CM

## 2015-08-06 DIAGNOSIS — M542 Cervicalgia: Secondary | ICD-10-CM

## 2015-08-06 DIAGNOSIS — R293 Abnormal posture: Secondary | ICD-10-CM

## 2015-08-06 NOTE — Therapy (Signed)
[facility] [address] [place], [place], [address] Phone: [phone]   Fax:  [phone]  Physical Therapy Treatment  Patient Details  Name: Richard Noble MRN: [id number] Date of Birth: 05-Feb-1966 No Data Recorded  Encounter Date: 08/06/2015      PT End of Session - 08/06/15 8341    Visit Number 6   Number of Visits 16   Date for PT Re-Evaluation 09/17/15   Authorization Type G code every 10th visit   PT Start Time 0804   PT Stop Time 0855   PT Time Calculation (min) 51 min   Activity Tolerance Patient limited by pain   Behavior During Therapy [facility] for tasks assessed/performed      History reviewed. No pertinent past medical history.  History reviewed. No pertinent past surgical history.  There were no vitals filed for this visit.  Visit Diagnosis:  Neck pain  Posture abnormality  Pain in neck  Generalized weakness      Subjective Assessment - 08/06/15 0808    Subjective No new complaints. Feeling better overall. The replacement TENS unit was also broken, so he has ordered a completely different one. Saw his primary MD who did not increase his Tramadol dosage, instead she has him on a 7 day steroid (unsure of dosage).                       Currently in Pain? Yes   Pain Score 6    Pain Location Neck   Pain Orientation Left   Pain Descriptors / Indicators Aching;Sore   Pain Radiating Towards left side   Pain Onset More than a month ago   Pain Frequency Constant   Aggravating Factors  increased activity, house hold activities   Pain Relieving Factors tramadol, heat, stretching      Treatment: Exercises: UBE level 2.5 x 2 minutes each fwd/bwd  Red pball at wall: cues on posture and ex form Rolling up/down with flexion stretch at top x 12 reps Circles x 12 both ways with emphasis on scapular stability Pushups x 12 reps  Back to ball at wall:  using 2# hand weights - Alternating UE raises  x 10 each side - "V" x 12 reps - "W" x 12 reps  With red theraband: cues on posture and ex form - horizontal abduction x 12 reps  - alternating diagonal's x 10 each way  Prone on mat: 12 reps each bil sides simultaneously with cues on form and neutral cervical position. - superman - aquaman - birdman  IFC estim concurrent with moist hot pack to left scapula area x 15 minutes, intensity to tolerance, for pain relief and decreased muscle tightness after exercises        PT Short Term Goals - 07/20/15 1458    PT SHORT TERM GOAL #1   Title The patient will be indep with HEP for scapular mobilization, postural stretching, and gentle stabilization.   Baseline Target date 08/19/2015   Time 4   Period Weeks   PT SHORT TERM GOAL #2   Title The patient will report pain baseline < or equal to 6/10 without medication mgmt.   Baseline Target date 08/19/2015   Time 4   Period Weeks   PT SHORT TERM GOAL #3   Title The patient will improve cervical A/ROM rotation to > or equal to 60 deg R and 75 deg L   Baseline Target date 08/19/2015   Time 4  Period Weeks   PT SHORT TERM GOAL #4   Title The patient will improve bilateral cervical sidebending to > or equal to 25 degrees.   Baseline Target date 08/19/2015   Time 4   Period Weeks           PT Long Term Goals - 07/20/15 1502    PT LONG TERM GOAL #1   Title The patient will verbalize understanding of sleeping positions for improved spinal support/comfort.   Baseline Target date 09/17/2015   Time 8   Period Weeks   PT LONG TERM GOAL #2   Title The patient will verbalize community program for post d/c progression of strengthening.   Baseline Target date 09/17/2015   Time 8   Period Weeks   PT LONG TERM GOAL #3   Title The patient will improve cervical flexion to > or equal to 25 degrees.   Baseline Target date 09/17/2015   Time 8   Period Weeks   PT LONG TERM GOAL #4   Title The patient will improve L UE strength to 5/5.    Baseline Target date 09/17/2015   Time 8   Period Weeks   PT LONG TERM GOAL #5   Title The patient will report reduced pain to < or equal to 4/10 at rest.   Baseline Target date 09/17/2015   Time 8   Period Weeks           Plan - 08/06/15 5449    Clinical Impression Statement Advance reps today with exercises without any issues reported. Still with tightness and limited scapular mobility/stability. Pt making steady progress toward goals.   Pt will benefit from skilled therapeutic intervention in order to improve on the following deficits Decreased strength;Decreased mobility;Postural dysfunction;Impaired flexibility;Decreased activity tolerance;Decreased range of motion;Increased muscle spasms   Rehab Potential Good   Clinical Impairments Affecting Rehab Potential prior PT with only short term improvements/ no carryover in past (pre-laser surgery).   PT Frequency 2x / week   PT Duration 8 weeks   PT Treatment/Interventions ADLs/Self Care Home Management;Neuromuscular re-education;Electrical Stimulation;Moist Heat;Therapeutic activities;Therapeutic exercise;Manual techniques;Functional mobility training;Patient/family education;Ultrasound;Cryotherapy;Taping   PT Next Visit Plan continue to work on strengthening, pain managment and advance HEP as needed.   Consulted and Agree with Plan of Care Patient        Problem List There are no active problems to display for this patient.   [name] 08/06/2015, 2:40 PM   [name], PTA, [facility] [address] [place], [place] [address] [phone] 08/06/2015, 2:40 PM  Name: Richard Noble MRN: [id number] Date of Birth: 06/13/1966

## 2015-08-07 ENCOUNTER — Ambulatory Visit: Payer: Medicare Other | Admitting: Rehabilitative and Restorative Service Providers"

## 2015-08-07 ENCOUNTER — Other Ambulatory Visit: Payer: Self-pay | Admitting: Orthopedic Surgery

## 2015-08-07 ENCOUNTER — Ambulatory Visit
Admission: RE | Admit: 2015-08-07 | Discharge: 2015-08-07 | Disposition: A | Discharge status: Home / Self Care | Payer: Medicare Other | Source: Ambulatory Visit | Attending: Orthopedic Surgery | Admitting: Orthopedic Surgery

## 2015-08-07 DIAGNOSIS — M542 Cervicalgia: Secondary | ICD-10-CM | POA: Insufficient documentation

## 2015-08-09 ENCOUNTER — Encounter: Payer: Self-pay | Admitting: Physical Therapy

## 2015-08-09 ENCOUNTER — Ambulatory Visit: Payer: Medicare Other | Admitting: Physical Therapy

## 2015-08-09 DIAGNOSIS — R531 Weakness: Secondary | ICD-10-CM

## 2015-08-09 DIAGNOSIS — M542 Cervicalgia: Secondary | ICD-10-CM

## 2015-08-09 DIAGNOSIS — R293 Abnormal posture: Secondary | ICD-10-CM

## 2015-08-09 NOTE — Therapy (Signed)
[facility] [address] [place], [place], [address] Phone: [phone]   Fax:  [phone]  Physical Therapy Treatment  Patient Details  Name: Richard Noble MRN: [id number] Date of Birth: Oct 19, 1965 No Data Recorded  Encounter Date: 08/09/2015      PT End of Session - 08/09/15 0925    Visit Number 7   Number of Visits 17   Date for PT Re-Evaluation 09/17/15   Authorization Type G code every 10th visit   PT Start Time 0805   PT Stop Time 0900   PT Time Calculation (min) 55 min   Activity Tolerance Patient tolerated treatment well   Behavior During Therapy [facility] for tasks assessed/performed      History reviewed. No pertinent past medical history.  History reviewed. No pertinent past surgical history.  There were no vitals filed for this visit.  Visit Diagnosis:  Neck pain  Posture abnormality  Pain in neck  Generalized weakness      Subjective Assessment - 08/09/15 0912    Subjective Pt. stated his pain to be 2/10 upon arrival. He said his pain decreased throughout the treatment. He said he switched to a different dr. to oversee his meds and the doctor increased his [facility] prescription.  "I have not filled the new prescription as of yet. Brought in his new TENS unit for therapy to look at. "I feel great today."   Pertinent History Patient has not been working out at the gym. He was told by therapist that he could return to gym to do light work to increase strength and endurance.   Limitations Sitting   Patient Stated Goals I want to get back in the gym and find a new job I can do with my impairments.    Currently in Pain? Yes   Pain Score 2    Pain Location Scapula   Pain Orientation Left   Pain Descriptors / Indicators Aching;Sore   Pain Type Chronic pain   Pain Radiating Towards left side   Pain Onset More than a month ago   Pain Frequency Constant   Aggravating Factors  sitting   Pain Relieving  Factors tramadol, tens unit, light activity and exercise.            PT Education - 08/09/15 0920    Education provided Yes   Education Details cardio and light resistance workouts at gym, How to use new tens unit.   Person(s) Educated Patient   Methods Explanation   Comprehension Verbalized understanding;Returned demonstration     Ther ex.: Verbal cues for scapular retraction and correct posture.  UBE 18mn. Each direction.  Pball on wall. Rolling Up and down: 235m. Rolling in circles. 4m57m Back to ball front shoulder raises, 3 # dumbell, 1 set 20 reps, then V raises same amount  On mat: Forearm planks 3 sets, 30sec., 40sec., 60sec. Mat pushups: 3 sets, 12reps, 8reps, 8reps. Lawnmower shoulder retractions with 5# dumbell, 2 sets of 12 reps each arm  Standing at wall: Ball overhead toss to [id number] with squat, with green med ball, 2 sets of 15 reps.  Therapeutic modalities: For pain reduction Interferential E-stim with pt.'s TENS unit with hot pack in supine position for fifteen minutes. Pt.'s skin WNL before and after e-stim. Treatment. Intensity increased to pt.'s preference.        PT Short Term Goals - 08/09/15 0936    PT SHORT TERM GOAL #1   Title The patient will be indep  with HEP for scapular mobilization, postural stretching, and gentle stabilization.   Baseline Target date 08/19/2015   Time 4   Period Weeks   Status Partially Met   PT SHORT TERM GOAL #2   Title The patient will report pain baseline < or equal to 6/10 without medication mgmt.   Baseline Target date 08/19/2015   Time 4   Status On-going   PT SHORT TERM GOAL #3   Title The patient will improve cervical A/ROM rotation to > or equal to 60 deg R and 75 deg L   Baseline Target date 08/19/2015   Time 4   Period Weeks   PT SHORT TERM GOAL #4   Title The patient will improve bilateral cervical sidebending to > or equal to 25 degrees.   Baseline Target date 08/19/2015   Time 4   Period Weeks    Status On-going           PT Long Term Goals - 07/20/15 1502    PT LONG TERM GOAL #1   Title The patient will verbalize understanding of sleeping positions for improved spinal support/comfort.   Baseline Target date 09/17/2015   Time 8   Period Weeks   PT LONG TERM GOAL #2   Title The patient will verbalize community program for post d/c progression of strengthening.   Baseline Target date 09/17/2015   Time 8   Period Weeks   PT LONG TERM GOAL #3   Title The patient will improve cervical flexion to > or equal to 25 degrees.   Baseline Target date 09/17/2015   Time 8   Period Weeks   PT LONG TERM GOAL #4   Title The patient will improve L UE strength to 5/5.   Baseline Target date 09/17/2015   Time 8   Period Weeks   PT LONG TERM GOAL #5   Title The patient will report reduced pain to < or equal to 4/10 at rest.   Baseline Target date 09/17/2015   Time 8   Period Weeks           Plan - 08/09/15 0933    Clinical Impression Statement Advanced to new higher level exercises without increased pain. Pt. is progressing to all goals. Pt. exhibits good understanding of proper posture, core and scapular stabilization.   Pt will benefit from skilled therapeutic intervention in order to improve on the following deficits Decreased strength;Decreased mobility;Postural dysfunction;Impaired flexibility;Decreased activity tolerance;Decreased range of motion;Increased muscle spasms   Rehab Potential Good   PT Frequency 2x / week   PT Duration 8 weeks   PT Treatment/Interventions ADLs/Self Care Home Management;Neuromuscular re-education;Electrical Stimulation;Moist Heat;Therapeutic activities;Therapeutic exercise;Manual techniques;Functional mobility training;Patient/family education;Ultrasound;Cryotherapy;Taping   PT Next Visit Plan Continue with POC. Continue to advance exercises to increase strength of shoulder girdle.   Consulted and Agree with Plan of Care Patient        Problem  List There are no active problems to display for this patient.   Laney Potash 08/09/2015, 10:37 AM  Laney Potash, SPTA  Name: Richard Noble MRN: [id number] Date of Birth: March 20, 1966  This note has been reviewed and edited by supervision CI.  [name], PTA, [facility] [address] [place], [place] [address] [phone] 08/10/2015, 12:08 PM

## 2015-08-15 ENCOUNTER — Ambulatory Visit: Payer: Medicare Other | Attending: Nurse Practitioner | Admitting: Rehabilitative and Restorative Service Providers"

## 2015-08-15 DIAGNOSIS — R531 Weakness: Secondary | ICD-10-CM | POA: Diagnosis present

## 2015-08-15 DIAGNOSIS — R293 Abnormal posture: Secondary | ICD-10-CM | POA: Diagnosis present

## 2015-08-15 DIAGNOSIS — M542 Cervicalgia: Secondary | ICD-10-CM | POA: Diagnosis present

## 2015-08-15 NOTE — Patient Instructions (Signed)
Neurovascular: Median Nerve Glide With Cervical Bias    Stand with right arm out to side, palm flat against wall, thumb up, elbow straight. Slowly move opposite side ear toward shoulder as far as possible without pain.  Hold 10-20 seconds. Repeat _3___ times per set. Do ___1_ sets per session.  Copyright  VHI. All rights reserved.  Ice Massage and Cold Packs Home Program  Ice and cold packs are used so that we can return the muscle to it's natural resting state without causing more pain, which can lead to more spasm, etc.  Icing and cold packs are also used to reduce swelling, which can lead to pain and stiffness.  COLD PACKS Cold packs should be placed circumferentially around the swollen area (ie., the wrist, etc.).  A paper towel can be placed over the area before the cold pack is applied.  A towel may be wrapped around the outside of the cold pack to keep the cold in.  The swollen area should be elevated with the cold pack, if possible.  ICE MASSAGE Fill a 4 ounce paper cup three-quarters full and put it in a freezer until it is frozen.  When ready to use, tear off about 1 inch of the cup so that some of the ice is showing while the bottom of the cup can be used to hold onto. Massage the entire muscle area as instructed by your therapist.  You may use circular or up and down strokes, but do not hold the ice in one spot.  Four phases to the ice massage and cold pack application: 1. Cold: which you feel when you first apply the ice. 2. Ache: after a few minutes 3. Burning: after approximately five minutes, it will feel like your skin is burning.  At this point, remove the ice for a minute or so. 4. Numbness: THIS IS THE CRUCIAL PHASE!!! Return the ice or cold pack and massage until all the burning disappears.  This signals the end of cryotherapy.  The entire procedure should take ten to fifteen minutes while using the cold pack.  Do Not perform the ice massage for more than seven minutes  on a small area or more than ten minutes on a large area.  Cryotherapy should be performed after exercises, when edema occurs, or when an area is painful.

## 2015-08-15 NOTE — Therapy (Signed)
[facility] [address] [place], [place], [address] Phone: [phone]   Fax:  [phone]  Physical Therapy Treatment  Patient Details  Name: TAKUYA LARICCIA MRN: [id number] Date of Birth: 1966/06/05 No Data Recorded  Encounter Date: 08/15/2015      PT End of Session - 08/15/15 1234    Visit Number 8   Number of Visits 17   Date for PT Re-Evaluation 09/17/15   Authorization Type G code every 10th visit   PT Start Time 0855   PT Stop Time 0935   PT Time Calculation (min) 40 min   Activity Tolerance Patient tolerated treatment well   Behavior During Therapy [facility] for tasks assessed/performed      No past medical history on file.  No past surgical history on file.  There were no vitals filed for this visit.  Visit Diagnosis:  Neck pain      Subjective Assessment - 08/15/15 1221    Subjective The patient continues with 2/10 symptoms at rest reporting some days he only uses 1/2 pill of tramadol 3x/day vs 1 full pill.  He reports he feels a significant improvement with PT, however does not feels he is ready for d/c because he wants to be able to return to gym routine.   Patient Stated Goals I want to get back in the gym and find a new job I can do with my impairments.    Currently in Pain? Yes   Pain Score 2    Pain Location Scapula   Pain Orientation Left   Pain Descriptors / Indicators Aching;Sore   Pain Type Chronic pain   Pain Onset More than a month ago   Pain Frequency Constant   Aggravating Factors  sitting   Pain Relieving Factors tramadol, light activity, stretching          [facility] Adult PT Treatment/Exercise - 08/15/15 1223    Exercises   Exercises Neck;Shoulder   Neck Exercises: Machines for Strengthening   UBE (Upper Arm Bike) 2 min forward, 2 min backwards * needs cues for scapular depression with moving anteriorly   Neck Exercises: Prone   Plank with alternating LE hip extension x 5 reps  each    Other Prone Exercise mat push ups x 10 reps without evidence of scapular winging   Shoulder Exercises: Prone   Horizontal ABduction 1 AROM;Strengthening;Right;Left;10 reps  tried weights and improved technique without resistance   Other Prone Exercises quadriped UE/LE reaching activities with shoulder co-contraction x 10 reps each side emphasizing neck retraction against gravity in this position.   Shoulder Exercises: Standing   Protraction AROM;Strengthening;Theraband;10 reps;Right;Left   Theraband Level (Shoulder Protraction) Level 3 (Green)  with R scapular winging noted   Horizontal ABduction Strengthening;12 reps;Theraband  with cues scap depression + retraction   Theraband Level (Shoulder Horizontal ABduction) Level 3 (Green)   Other Standing Exercises neural gliding L and R sides with cervical sidebending.   Shoulder Exercises: Stretch   Other Shoulder Stretches physioball stretching for anterior chest musculature with patient c/o tightness in L anterior/pectoralis region   Manual Therapy   Manual Therapy Soft tissue mobilization   Manual therapy comments L trigger point release upper trapezius   Soft tissue mobilization soft tissue mobilization in parascapular region, subscapularis, scalenes and upper trap           PT Short Term Goals - 08/15/15 0900    PT SHORT TERM GOAL #1   Title The patient will be  indep with HEP for scapular mobilization, postural stretching, and gentle stabilization.   Baseline Target date 08/19/2015   Time 4   Period Weeks   Status On-going   PT SHORT TERM GOAL #2   Title The patient will report pain baseline < or equal to 6/10 without medication mgmt.   Baseline Target date 08/19/2015   Time 4   Status On-going   PT SHORT TERM GOAL #3   Title The patient will improve cervical A/ROM rotation to > or equal to 60 deg R and 75 deg L   Baseline Target date 08/19/2015   Time 4   Period Weeks   PT SHORT TERM GOAL #4   Title The patient will  improve bilateral cervical sidebending to > or equal to 25 degrees.   Baseline Target date 08/19/2015   Time 4   Period Weeks   Status On-going           PT Long Term Goals - 07/20/15 1502    PT LONG TERM GOAL #1   Title The patient will verbalize understanding of sleeping positions for improved spinal support/comfort.   Baseline Target date 09/17/2015   Time 8   Period Weeks   PT LONG TERM GOAL #2   Title The patient will verbalize community program for post d/c progression of strengthening.   Baseline Target date 09/17/2015   Time 8   Period Weeks   PT LONG TERM GOAL #3   Title The patient will improve cervical flexion to > or equal to 25 degrees.   Baseline Target date 09/17/2015   Time 8   Period Weeks   PT LONG TERM GOAL #4   Title The patient will improve L UE strength to 5/5.   Baseline Target date 09/17/2015   Time 8   Period Weeks   PT LONG TERM GOAL #5   Title The patient will report reduced pain to < or equal to 4/10 at rest.   Baseline Target date 09/17/2015   Time 8   Period Weeks               Plan - 08/15/15 1235    Clinical Impression Statement The patient is continuing to progress tolerance to activity and decrease pain level.  He continues to feel tightness in left pectoralis region and trigger point noted in L upper trapezius.  PT to check STGs and begin transitioning to community strengthening program for discharge.   PT Next Visit Plan check STGs, begin teaching in gym exercises to progress activities and start moving from clinical to community wellness focus to paitent tolerance.   Consulted and Agree with Plan of Care Patient        Problem List There are no active problems to display for this patient.   Olin, Albin 08/15/2015, 12:37 PM  [facility] [address] [place], [place], [address] Phone: [phone]   Fax:  [phone]  Name: ELLARD NAN MRN:  [id number] Date of Birth: 05/14/1966

## 2015-08-17 ENCOUNTER — Ambulatory Visit: Payer: Medicare Other | Admitting: Physical Therapy

## 2015-08-17 ENCOUNTER — Encounter: Payer: Self-pay | Admitting: Physical Therapy

## 2015-08-17 DIAGNOSIS — R531 Weakness: Secondary | ICD-10-CM

## 2015-08-17 DIAGNOSIS — M542 Cervicalgia: Secondary | ICD-10-CM | POA: Diagnosis not present

## 2015-08-17 DIAGNOSIS — R293 Abnormal posture: Secondary | ICD-10-CM

## 2015-08-17 NOTE — Therapy (Signed)
[facility] [address] [place], [place], [address] Phone: [phone]   Fax:  [phone]  Physical Therapy Treatment  Patient Details  Name: Richard Noble MRN: [id number] Date of Birth: 29-Jul-1966 No Data Recorded  Encounter Date: 08/17/2015      PT End of Session - 08/17/15 0944    Visit Number 9   Number of Visits 17   Date for PT Re-Evaluation 09/17/15   Authorization Type G code every 10th visit   PT Start Time 0845   PT Stop Time 0930   PT Time Calculation (min) 45 min   Activity Tolerance Patient tolerated treatment well   Behavior During Therapy [facility] for tasks assessed/performed      History reviewed. No pertinent past medical history.  History reviewed. No pertinent past surgical history.  There were no vitals filed for this visit.  Visit Diagnosis:  Neck pain  Posture abnormality  Pain in neck  Generalized weakness      Subjective Assessment - 08/17/15 0846    Subjective Pt. rates pain 4/10 with pain meds at rest. Pt. stated that his Dr. was might do surgery if PT is not enough for sufficient pain reduction. Pain did increase to 6/10 with activity.   Limitations Sitting   Patient Stated Goals I want to get back in the gym and find a new job I can do with my impairments.    Currently in Pain? Yes   Pain Score 6    Pain Location Scapula   Pain Orientation Left   Pain Descriptors / Indicators Aching;Sore   Pain Type Chronic pain   Pain Radiating Towards left side   Pain Frequency Constant   Aggravating Factors  sitting    Pain Relieving Factors tramadol, light activity, stretching   Effect of Pain on Daily Activities --     Therapeutic Exercise: To increase cervical ROM, and increase strength and stability of the shoulder girdle for decreased pain with ADL's. Pt. Supervision with verbal and tactile cues for scapular stabilization (down and back on R) during exercises.   3 pound ball  upward toss to 75f. Target on wall with squat. 2 sets X12reps. Verbal and tactile Cues for shoulder stabilization and hand placement. Plank on hands on mat. 3 sets, 30 sec, 45 sec, 60sec. Unilateral scapular retraction kneeling on mat. 1 set each UE. X12 reps.  Standing green Theraband horizontal adduction and abduction. 1 set each X 20reps. Standing green Theraband rows. 1 set X20 reps. Mat pushups with hands on edge of mat with feet on floor. 1 set X12 reps. Pt. Advised not to push through final difficult reps to avoid therapy set back. Blue Physioball wall pushups to facilitate scapular stabilization. 1 set X12 reps.   Stretches on blue physioball. 2, 45 sec stretch on each side, pt on back with arms stretched out for stretch of horizontal adductors to promote scapular retraction. Pt. Mod. I assist. Independent passive stretch to UE ext. Rotators and cervical muscles. One hand on stair rail with body and neck leaning away from stairs  to promote downward motion of the scapula. R shoulder presents higher than left. Pt. Required verbal cue for hand placement and technique.          [facility] - 08/17/15 0001    AROM   Cervical - Right Side Bend 30   Cervical - Left Side Bend 28   Cervical - Right Rotation 63   Cervical - Left Rotation  86           PT Education - 08/17/15 0940    Education provided Yes   Education Details Pt. educated on exercise intensity (not to push through an increase in >2 of pain and to not push through difficult final reps that could increase pain, causing a set back in treatment). Pt. has not yet initiated gym plan.   Person(s) Educated Patient   Methods Explanation   Comprehension Verbalized understanding         PT Short Term Goals - 08/17/15 0945    PT SHORT TERM GOAL #1   Title The patient will be indep with HEP for scapular mobilization, postural stretching, and gentle stabilization.   Baseline Achieved 08/17/15   Time 4   Period Weeks    Status Achieved   PT SHORT TERM GOAL #2   Title The patient will report pain baseline < or equal to 6/10 without medication mgmt.   Baseline PT has defered this goal due to pt. receiving new RX of Tramadol.    Time 4   Status Deferred   PT SHORT TERM GOAL #3   Title The patient will improve cervical A/ROM rotation to > or equal to 60 deg R and 75 deg L   Baseline Target date 08/19/2015   Time 4   Period Weeks   Status Not Met   PT SHORT TERM GOAL #4   Title The patient will improve bilateral cervical sidebending to > or equal to 25 degrees.   Baseline Achieved 08/17/15   Time 4   Period Weeks   Status Achieved            PT Long Term Goals - 08/17/15 0945    PT LONG TERM GOAL #1   Title The patient will verbalize understanding of sleeping positions for improved spinal support/comfort.   Baseline Target date 09/17/2015   Time 8   Period Weeks   PT LONG TERM GOAL #2   Title The patient will verbalize community program for post d/c progression of strengthening.   Baseline Target date 09/17/2015   Time 8   Period Weeks   PT LONG TERM GOAL #3   Title The patient will improve cervical flexion to > or equal to 25 degrees.   Baseline Target date 09/17/2015   Time 8   Period Weeks   PT LONG TERM GOAL #4   Title The patient will improve L UE strength to 5/5.   Baseline Target date 09/17/2015   Time 8   Period Weeks   PT LONG TERM GOAL #5   Title The patient will report reduced pain to < or equal to 4/10 at rest.   Baseline Target date 09/17/2015   Time 8   Period Weeks               Plan - 08/17/15 0944    Clinical Impression Statement Pt. has achieved STG's 1 and 4. STG 2 has been deferred (STG's). Pt. understands scapular stabilization during therapeutic exercise. He is eager to start a gym program. He states that PT sessions are helping to reduce his pain in the following days. Pt. is still dependent on Tramadol for pain management .   Pt will benefit from skilled  therapeutic intervention in order to improve on the following deficits Decreased strength;Decreased mobility;Postural dysfunction;Impaired flexibility;Decreased activity tolerance;Decreased range of motion;Increased muscle spasms   PT Frequency 2x / week   PT Duration 8 weeks   PT Treatment/Interventions  ADLs/Self Care Home Management;Neuromuscular re-education;Electrical Stimulation;Moist Heat;Therapeutic activities;Therapeutic exercise;Manual techniques;Functional mobility training;Patient/family education;Ultrasound;Cryotherapy;Taping   PT Next Visit Plan Attempt stability taping for scapular stability during exercises. Continue with shoulder stabilization exercises that promote proper posture and pain relief. Also continue with stretches that promote shoulder depression and retraction. Continue educating on exercises for him to incorporate into gym plan.   Consulted and Agree with Plan of Care Patient        Problem List There are no active problems to display for this patient.   Laney Potash 08/17/2015, 12:06 PM  [facility] Potash, [place] Name: CLAUDIUS MICH MRN: [id number] Date of Birth: Nov 12, 1965  This entire session was performed under direct supervision and direction of a licensed Chiropractor . I have personally read, edited and approve of the note as written.  [name], PTA, [facility] [address], [place], [place] [address] [phone] 08/17/2015, 1:01 PM

## 2015-08-21 ENCOUNTER — Encounter: Payer: Self-pay | Admitting: Physical Therapy

## 2015-08-21 ENCOUNTER — Ambulatory Visit: Payer: Medicare Other | Admitting: Physical Therapy

## 2015-08-21 DIAGNOSIS — R531 Weakness: Secondary | ICD-10-CM

## 2015-08-21 DIAGNOSIS — M542 Cervicalgia: Secondary | ICD-10-CM

## 2015-08-21 DIAGNOSIS — R293 Abnormal posture: Secondary | ICD-10-CM

## 2015-08-21 NOTE — Therapy (Addendum)
[facility] [address] [place], [place], [address] Phone: [phone]   Fax:  [phone]  Physical Therapy Treatment  Patient Details  Name: Richard Noble MRN: [id number] Date of Birth: [birth year] No Data Recorded  Encounter Date: 08/21/2015      PT End of Session - 08/21/15 0944    Visit Number 10   Number of Visits 17   Date for PT Re-Evaluation 09/17/15   Authorization Type G code every 10th visit   PT Start Time 0848   PT Stop Time 0930   PT Time Calculation (min) 42 min   Activity Tolerance Patient tolerated treatment well   Behavior During Therapy [facility] for tasks assessed/performed      History reviewed. No pertinent past medical history.  History reviewed. No pertinent past surgical history.  There were no vitals filed for this visit.  Visit Diagnosis:  Neck pain  Posture abnormality  Pain in neck  Generalized weakness      Subjective Assessment - 08/21/15 0941    Subjective Pt. rates upper back/posterior neck pain as a 5/10 pain at rest initially.  Pt. reported a reduction of pain during therapeutic exercise.        Therapeutic Exercise: To increase cervical ROM, and increase strength and stability of the shoulder girdle for decreased pain with ADL's. Pt. Supervision with verbal and tactile cues for scapular stabilization (down and back on R) during exercises.  Prone on mat: -Supermans x 10 reps  -Birdmans x 10 reps  Pt. Was supervision for technique with all of the following therex requiring occasional verbal and tactile cues on proper form: -Sitting row backs 3 x 10 reps with green TB -Upright row 2 x 10 reps with green TB -Lat pull down on cable machine 45# 5 x 10  -Row backs on cable machine 45# 4 x 10 reps -Standing reverse flys with red TB in a lunge position; pt. Required verbal cues for technique -Standing isometric reverse flys with red TB in lunge position 2 x 10 reps -Side  bending sustained posterior fly in half kneeling x 10 reps both sides.    Stretching (to increase cervical and scapulothoracic flexibility): -Bilateral wall stretch for pactoralis major 3 x 20 sec   Oswestry Neck Disability Index: 29/50         PT Short Term Goals - 08/17/15 0945    PT SHORT TERM GOAL #1   Title The patient will be indep with HEP for scapular mobilization, postural stretching, and gentle stabilization.   Baseline Achieved 08/17/15   Time 4   Period Weeks   Status Achieved   PT SHORT TERM GOAL #2   Title The patient will report pain baseline < or equal to 6/10 without medication mgmt.   Baseline PT has defered this goal due to pt. receiving new RX of Tramadol.    Time 4   Status Deferred   PT SHORT TERM GOAL #3   Title The patient will improve cervical A/ROM rotation to > or equal to 60 deg R and 75 deg L   Baseline Target date 08/19/2015   Time 4   Period Weeks   Status Not Met   PT SHORT TERM GOAL #4   Title The patient will improve bilateral cervical sidebending to > or equal to 25 degrees.   Baseline Achieved 08/17/15   Time 4   Period Weeks   Status Achieved  PT Long Term Goals - 08/17/15 0945    PT LONG TERM GOAL #1   Title The patient will verbalize understanding of sleeping positions for improved spinal support/comfort.   Baseline Target date 09/17/2015   Time 8   Period Weeks   PT LONG TERM GOAL #2   Title The patient will verbalize community program for post d/c progression of strengthening.   Baseline Target date 09/17/2015   Time 8   Period Weeks   PT LONG TERM GOAL #3   Title The patient will improve cervical flexion to > or equal to 25 degrees.   Baseline Target date 09/17/2015   Time 8   Period Weeks   PT LONG TERM GOAL #4   Title The patient will improve L UE strength to 5/5.   Baseline Target date 09/17/2015   Time 8   Period Weeks   PT LONG TERM GOAL #5   Title The patient will report reduced pain to < or equal to  4/10 at rest.   Baseline Target date 09/17/2015   Time 8   Period Weeks            Plan - Sep 13, 2015 0945    Clinical Impression Statement Pt. tolerated high repetition resisted theraband and resisted cable activity well reporting a decrease in pain end of session.  Pt. restricted by PTA to high rep/low weight (< 45#) cable machine activities, with focus on form to increase activity tolerance and posture.  Pt. reports attending gym regularly.     Pt will benefit from skilled therapeutic intervention in order to improve on the following deficits Decreased strength;Decreased mobility;Postural dysfunction;Impaired flexibility;Decreased activity tolerance;Decreased range of motion;Increased muscle spasms   PT Frequency 2x / week   PT Duration 8 weeks   PT Treatment/Interventions ADLs/Self Care Home Management;Neuromuscular re-education;Electrical Stimulation;Moist Heat;Therapeutic activities;Therapeutic exercise;Manual techniques;Functional mobility training;Patient/family education;Ultrasound;Cryotherapy;Taping   PT Next Visit Plan Continue with shoulder stabilization exercises that promote proper posture and pain relief.  Continue posture education sitting/standing.       Consulted and Agree with Plan of Care Patient       Sep 13, 2015 0817  PT G-Codes  Functional Assessment Tool Used pain 5/10; neck disabliity index 29/50 (58%)  Functional Limitation Mobility: Walking and moving around  Mobility: Walking and Moving Around Current Status [phone]) CJ  Mobility: Walking and Moving Around Goal Status ([id number]) CJ    Physical Therapy Progress Note  Dates of Reporting Period: 07/20/2015 to 2015/09/13   Objective Reports of Subjective Statement: Patient continues wit 58% limitation on neck disability index  Objective Measurements: patient improved cervical sidebending, pain decreased from 8/10 to 5/10  Goal Update: see above goal status  Plan: continue to progress to community program and work  towards [employer].  Reason Skilled Services are Required: continue progressing to gym routine for long term pain management.   WEAVER,CHRISTINA, PT    Problem List There are no active problems to display for this patient.   Richard Noble 2015-09-13, 1:14 PM  Richard Noble, [place]  Name: JERAMINE DELIS MRN: [id number] Date of Birth: Apr 24, 1966  This note has been reviewed and edited by supervising CI.  [name], PTA, [facility] [address], [place], [place] [address] [phone] 08/23/2015, 8:16 AM

## 2015-08-21 NOTE — Patient Instructions (Addendum)
Pt. Instructed on importance of HEP for stretching bilateral chest for improved posture.  Pt. Instructed on rule of 90's for desk height, and occupational sitting posture.

## 2015-08-23 ENCOUNTER — Ambulatory Visit: Payer: Medicare Other | Admitting: Physical Therapy

## 2015-08-23 DIAGNOSIS — R531 Weakness: Secondary | ICD-10-CM

## 2015-08-23 DIAGNOSIS — M542 Cervicalgia: Secondary | ICD-10-CM

## 2015-08-23 DIAGNOSIS — R293 Abnormal posture: Secondary | ICD-10-CM

## 2015-08-23 DIAGNOSIS — R200 Anesthesia of skin: Secondary | ICD-10-CM | POA: Insufficient documentation

## 2015-08-23 NOTE — Therapy (Signed)
[facility] [address] [place], [place], [address] Phone: [phone]   Fax:  [phone]  Physical Therapy Treatment  Patient Details  Name: Richard Noble MRN: [id number] Date of Birth: 04-14-1966 No Data Recorded  Encounter Date: 08/23/2015      PT End of Session - 08/23/15 0944    Visit Number 11   Number of Visits 17   Date for PT Re-Evaluation 09/17/15   Authorization Type G code every 10th visit   PT Start Time 0845   PT Stop Time 0930   PT Time Calculation (min) 45 min   Activity Tolerance Patient tolerated treatment well   Behavior During Therapy [facility] for tasks assessed/performed      No past medical history on file.  No past surgical history on file.  There were no vitals filed for this visit.  Visit Diagnosis:  Neck pain  Posture abnormality  Pain in neck  Generalized weakness      Subjective Assessment - 08/23/15 0936    Subjective Pt reports aching pain for the past 3 weeks in bilateral shoulders. "I clinch my hands tight and I think that is making my muscles sore". Pt. reported 3/10 pain before treatment and 2/10 after. Pt reported that he is only walking at the gym right now.   Limitations Sitting   Patient Stated Goals I want to get back in the gym and find a new job I can do with my impairments.    Currently in Pain? Yes   Pain Score 3    Pain Location Scapula   Pain Orientation Left   Pain Descriptors / Indicators Aching;Sore   Pain Type Chronic pain   Pain Onset More than a month ago   Pain Frequency Constant   Aggravating Factors  sitting   Pain Relieving Factors tramadol, light activity, stretching   Multiple Pain Sites Yes   Pain Score 3   Pain Location Arm   Pain Orientation Right;Left   Pain Descriptors / Indicators Aching   Pain Type Chronic pain   Pain Onset 1 to 4 weeks ago   Pain Frequency Intermittent   Aggravating Factors  habit of clinching hands  subconsciously   Pain Relieving Factors relaxing and stretching.     Therapeutic exercise: To strengthen and stabilize core muscles and shoulder girdle to promote good resting posture and for pain management. Pt supervision with verbal and tactile cues for proper form. Scapular winging was minimal during session. 1. Planks with 2 sets, 30 sec and 45 sec. 2. Lat pull downs with 25 lbs with focus on scapular stabilization. 2 sets X12 reps. 3. Bilateral rows on cable machine in squat position. 1 set x12 reps. 4. Gait belt stretch to lats in lunge position arm reaching over head. 30 sec each arm. 5. Gait belt stretch to medial rotators in lunge position with arm reaching back behind. 30 sec each arm. 6. Bilateral shoulder presses on red physio ball with 5lb dumbells. 2 sets X12 reps.   NMR: To retrain scapular stabilizers to increase muscle integrity and stability of shoulders and neck. Verbal and tactile cues for core and shoulder stabilization. 1. Quick ball toss on wall with 3 lb yellow ball. Passes chest height, and head height for 3 min. 2. Push ups on red physio ball with prone upper body hand off of mat with forward and lateral rolling as well. 2 sets, 45mn each.  3. Plank lateral UE walks on edge of mat 24  in up. 1 set X4 laps.        PT Education - 08/23/15 0941    Education provided Yes   Education Details Pt. educated on exercises to do at the gym. Pt advised on strengthening every other day and doing core and cardio exercises every other day to reduce risk of injury.   Person(s) Educated Patient   Methods Explanation   Comprehension Verbalized understanding;Returned demonstration          PT Short Term Goals - 08/23/15 0950    PT SHORT TERM GOAL #1   Title The patient will be indep with HEP for scapular mobilization, postural stretching, and gentle stabilization.   Baseline Achieved 08/17/15   Time 4   Period Weeks   Status Achieved   PT SHORT TERM GOAL #2   Title The  patient will report pain baseline < or equal to 6/10 without medication mgmt.   Baseline PT has defered this goal due to pt. receiving new RX of Tramadol.    Time 4   Status Deferred   PT SHORT TERM GOAL #3   Title The patient will improve cervical A/ROM rotation to > or equal to 60 deg R and 75 deg L   Baseline Target date 08/19/2015   Time 4   Period Weeks   Status Not Met   PT SHORT TERM GOAL #4   Title The patient will improve bilateral cervical sidebending to > or equal to 25 degrees.   Baseline Achieved 08/17/15   Time 4   Period Weeks   Status Achieved           PT Long Term Goals - 08/23/15 0950    PT LONG TERM GOAL #1   Title The patient will verbalize understanding of sleeping positions for improved spinal support/comfort.   Baseline Target date 09/17/2015   Time 8   Period Weeks   PT LONG TERM GOAL #2   Title The patient will verbalize community program for post d/c progression of strengthening.   Baseline Target date 09/17/2015   Time 8   Period Weeks   PT LONG TERM GOAL #3   Title The patient will improve cervical flexion to > or equal to 25 degrees.   Baseline Target date 09/17/2015   Time 8   Period Weeks   PT LONG TERM GOAL #4   Title The patient will improve L UE strength to 5/5.   Baseline Target date 09/17/2015   Time 8   Period Weeks   PT LONG TERM GOAL #5   Title The patient will report reduced pain to < or equal to 4/10 at rest.   Baseline Target date 09/17/2015   Time 8   Period Weeks           Plan - 08/23/15 0944    Clinical Impression Statement Pt instructed on various exercises with a focus on scapular stabilization with distal mobility. Pt is progressing towards goals through strengthening and stabilization of core and shoulder girdle muscles. Pt advised to start working on shoulder exercises on his own in gym.    Pt will benefit from skilled therapeutic intervention in order to improve on the following deficits Decreased strength;Decreased  mobility;Postural dysfunction;Impaired flexibility;Decreased activity tolerance;Decreased range of motion;Increased muscle spasms   Rehab Potential Good   PT Frequency 2x / week   PT Duration 8 weeks   PT Treatment/Interventions ADLs/Self Care Home Management;Neuromuscular re-education;Electrical Stimulation;Moist Heat;Therapeutic activities;Therapeutic exercise;Manual techniques;Functional mobility training;Patient/family education;Ultrasound;Cryotherapy;Taping   PT  Next Visit Plan Update HEP for gym program.   Consulted and Agree with Plan of Care Patient        Problem List There are no active problems to display for this patient.   Laney Potash 08/23/2015, 9:52 AM  Laney Potash, SPTA Name: XAVI TOMASIK MRN: [id number]   This note has been reviewed and edited by supervising CI.  [name], PTA, [facility] [address] [place], [place] [address] [phone] 08/24/2015, 10:14 AM

## 2015-08-28 ENCOUNTER — Ambulatory Visit: Payer: Medicare Other | Admitting: Physical Therapy

## 2015-08-28 ENCOUNTER — Encounter: Payer: Self-pay | Admitting: Physical Therapy

## 2015-08-28 DIAGNOSIS — M542 Cervicalgia: Secondary | ICD-10-CM

## 2015-08-28 DIAGNOSIS — R293 Abnormal posture: Secondary | ICD-10-CM

## 2015-08-28 DIAGNOSIS — R531 Weakness: Secondary | ICD-10-CM

## 2015-08-28 NOTE — Therapy (Signed)
[facility] [address] [place], [place], [address] Phone: [phone]   Fax:  [phone]  Physical Therapy Treatment  Patient Details  Name: Richard Noble MRN: [id number] Date of Birth: 07/12/1966 No Data Recorded  Encounter Date: 08/28/2015      PT End of Session - 08/28/15 0902    Visit Number 12   Number of Visits 17   Date for PT Re-Evaluation 09/17/15   Authorization Type G code every 10th visit   PT Start Time 0800   PT Stop Time 0845   PT Time Calculation (min) 45 min   Activity Tolerance Patient tolerated treatment well   Behavior During Therapy [facility] for tasks assessed/performed      History reviewed. No pertinent past medical history.  History reviewed. No pertinent past surgical history.  There were no vitals filed for this visit.  Visit Diagnosis:  Neck pain  Posture abnormality  Pain in neck  Generalized weakness      Subjective Assessment - 08/28/15 0814    Subjective Pt reports 1/10 pain with pain meds. He reports that exercising at the gym is helping him feel much better. "I went to the gym yesterday. I am sore but it is good sore!"   Limitations Sitting   Patient Stated Goals I want to get back in the gym and find a new job I can do with my impairments.    Currently in Pain? Yes   Pain Score 1    Pain Location Scapula   Pain Orientation Left   Pain Descriptors / Indicators Aching;Sore   Pain Type Chronic pain   Pain Onset More than a month ago   Aggravating Factors  sitting   Pain Relieving Factors tramadol, exercise, and stretching   Pain Onset 1 to 4 weeks ago     Therapeutic exercise: To increase strength and stabilization of core muscles in order to enable proper body mechanics of the extremities. Pt supervision with verbal and tactile cues for core and shoulder stabilization and proper form.  1. Planks on hands: 3 planks, 60 sec each. First plank without BOSU ball and  remaining 2 with BOSU ball. 2. Walking lunges with green physioball over head. Cues for knee over ankle. 2 sets, X15 reps. 3. Push ups on rubber bubbles. 2 sets, X10 reps. 4. Calf raises with 8 lb dumbells: 2 sets, X12 reps. 5. Shoulder press with corkscrew seated on blue physioball 8lb dumbell, 2 sets, X12 reps. 6. Parallel bar body lift shoulder stabilization with straight arms. 2, 30 sec holds.        PT Short Term Goals - 08/28/15 0906    PT SHORT TERM GOAL #1   Title The patient will be indep with HEP for scapular mobilization, postural stretching, and gentle stabilization.   Baseline Achieved 08/17/15   Time 4   Period Weeks   Status Achieved   PT SHORT TERM GOAL #2   Title The patient will report pain baseline < or equal to 6/10 without medication mgmt.   Baseline PT has defered this goal due to pt. receiving new RX of Tramadol.    Time 4   Status Deferred   PT SHORT TERM GOAL #3   Title The patient will improve cervical A/ROM rotation to > or equal to 60 deg R and 75 deg L   Baseline Target date 08/19/2015   Time 4   Period Weeks   Status Not Met   PT SHORT TERM  GOAL #4   Title The patient will improve bilateral cervical sidebending to > or equal to 25 degrees.   Baseline Achieved 08/17/15   Time 4   Period Weeks   Status Achieved           PT Long Term Goals - 08/28/15 0906    PT LONG TERM GOAL #1   Title The patient will verbalize understanding of sleeping positions for improved spinal support/comfort.   Baseline Target date 09/17/2015   Time 8   Period Weeks   PT LONG TERM GOAL #2   Title The patient will verbalize community program for post d/c progression of strengthening.   Baseline Target date 09/17/2015   Time 8   Period Weeks   PT LONG TERM GOAL #3   Title The patient will improve cervical flexion to > or equal to 25 degrees.   Baseline Target date 09/17/2015   Time 8   Period Weeks   PT LONG TERM GOAL #4   Title The patient will improve L UE  strength to 5/5.   Baseline Target date 09/17/2015   Time 8   Period Weeks   PT LONG TERM GOAL #5   Title The patient will report reduced pain to < or equal to 4/10 at rest.   Baseline Target date 09/17/2015   Time 8   Period Weeks               Plan - 08/28/15 7510    Clinical Impression Statement Pt is progressing towards long term goals. Pt's pain continues to decrease with exercise. Pt reports visiting gym most days of the week. Pt's shoulder girdle presents stable during dynamic upper and lower body exercises.   Pt will benefit from skilled therapeutic intervention in order to improve on the following deficits Decreased strength;Decreased mobility;Postural dysfunction;Impaired flexibility;Decreased activity tolerance;Decreased range of motion;Increased muscle spasms   Rehab Potential Good   PT Frequency 2x / week   PT Duration 8 weeks   PT Treatment/Interventions ADLs/Self Care Home Management;Neuromuscular re-education;Electrical Stimulation;Moist Heat;Therapeutic activities;Therapeutic exercise;Manual techniques;Functional mobility training;Patient/family education;Ultrasound;Cryotherapy;Taping   PT Next Visit Plan Assess LTG's next session and plan for discharge.   Consulted and Agree with Plan of Care Patient        Problem List There are no active problems to display for this patient.   Laney Potash 08/28/2015, 9:07 AM  Laney Potash, SPTA  Name: Richard Noble MRN: [id number] Date of Birth: [birth year]  This note has been reviewed and edited by supervising CI.  [name], PTA, [facility] [address] [place], [place] [address] [phone] 08/29/2015, 9:59 AM

## 2015-08-31 ENCOUNTER — Ambulatory Visit: Payer: Medicare Other | Admitting: Physical Therapy

## 2015-08-31 ENCOUNTER — Encounter: Payer: Self-pay | Admitting: Physical Therapy

## 2015-08-31 DIAGNOSIS — M542 Cervicalgia: Secondary | ICD-10-CM | POA: Diagnosis not present

## 2015-08-31 DIAGNOSIS — R293 Abnormal posture: Secondary | ICD-10-CM

## 2015-08-31 DIAGNOSIS — R531 Weakness: Secondary | ICD-10-CM

## 2015-08-31 NOTE — Therapy (Signed)
[facility] [address] [place], [place], [address] Phone: [phone]   Fax:  [phone]  Physical Therapy Treatment  Patient Details  Name: Richard Noble MRN: [id number] Date of Birth: September 09, 1966 No Data Recorded  Encounter Date: 08/31/2015      PT End of Session - 08/31/15 0936    Visit Number 13   Number of Visits 17   Date for PT Re-Evaluation 09/17/15   Authorization Type G code every 10th visit   PT Start Time 0845   PT Stop Time 0930   PT Time Calculation (min) 45 min   Activity Tolerance Patient tolerated treatment well   Behavior During Therapy [facility] for tasks assessed/performed      History reviewed. No pertinent past medical history.  History reviewed. No pertinent past surgical history.  There were no vitals filed for this visit.  Visit Diagnosis:  Neck pain  Posture abnormality  Pain in neck  Generalized weakness      Subjective Assessment - 08/31/15 0903    Subjective Pt reports 3/10 pain today. No change in status. Pt reports going to the gym 5 days per week.   Limitations Sitting   Patient Stated Goals I want to get back in the gym and find a new job I can do with my impairments.    Currently in Pain? Yes   Pain Score 3    Pain Location Scapula   Pain Orientation Left   Pain Descriptors / Indicators Aching;Sore   Pain Type Chronic pain   Pain Onset More than a month ago   Pain Frequency Constant   Multiple Pain Sites No   Pain Onset 1 to 4 weeks ago     Therapeutic Exercise: To increase strength of core, shoulder girdle, and extremities. Pt supervision with exercises. Verbal cues for proper form.  1. Push-ups on foam bubbles: 2 sets X10 reps. 2. Walking lunges with arms over head holding green physio ball: 2 sets X10 reps each leg. 3. Lat pull down on cable machine: 2 sets X15 reps, 40#'s. 4. Yellow med ball throw to 8 ft target with squat: 2 sets, 1st set X12 reps, 2nd set  X18 reps. 5. Deadlifting crate of med balls. Total of 25#'s: 2 sets, 1st set X10 reps, 2nd set X5 reps. 6. Shoulder stabilization hold with parallel bars for UE support.        [facility] PT Assessment - 08/31/15 0001    AROM   Overall AROM  Within functional limits for tasks performed   Cervical Flexion 27   Strength   Overall Strength Comments Pt grossly 5/5 in L UE.          PT Education - 08/31/15 0935    Education provided Yes   Education Details Pt educated on lifting technique for function at home and to use as exercise at gym. Pt also educated on  hamstring stretching technique.   Person(s) Educated Patient   Methods Explanation;Demonstration   Comprehension Verbalized understanding;Returned demonstration          PT Short Term Goals - 08/31/15 0941    PT SHORT TERM GOAL #1   Title The patient will be indep with HEP for scapular mobilization, postural stretching, and gentle stabilization.   Baseline Achieved 08/17/15   Time 4   Period Weeks   Status Achieved   PT SHORT TERM GOAL #2   Title The patient will report pain baseline < or equal to 6/10 without medication  mgmt.   Baseline PT has defered this goal due to pt. receiving new RX of Tramadol.    Time 4   Status Deferred   PT SHORT TERM GOAL #3   Title The patient will improve cervical A/ROM rotation to > or equal to 60 deg R and 75 deg L   Baseline Target date 08/19/2015   Time 4   Period Weeks   Status Not Met   PT SHORT TERM GOAL #4   Title The patient will improve bilateral cervical sidebending to > or equal to 25 degrees.   Baseline Achieved 08/17/15   Time 4   Period Weeks   Status Achieved           PT Long Term Goals - 08/31/15 0941    PT LONG TERM GOAL #1   Title The patient will verbalize understanding of sleeping positions for improved spinal support/comfort.   Baseline achieved: 08/31/15   Time 8   Period Weeks   Status Achieved   PT LONG TERM GOAL #2   Title The patient will verbalize  community program for post d/c progression of strengthening.   Baseline achieved: 08/31/15   Time 8   Period Weeks   Status Achieved   PT LONG TERM GOAL #3   Title The patient will improve cervical flexion to > or equal to 25 degrees.   Baseline achieved: 08/31/15   Time 8   Period Weeks   Status Achieved   PT LONG TERM GOAL #4   Title The pt will improve L UE strength to 5/5.   Baseline achieved: 08/31/15   Time 8   Period Weeks   Status Achieved   PT LONG TERM GOAL #5   Title The patient will report reduced pain to < or equal to 4/10 at rest.   Baseline achieved: 08/31/15   Time 8   Period Weeks   Status Achieved               Plan - 08/31/15 6389    Clinical Impression Statement Pt has met all LTG's. Pt has progressed in therapeutic exercise and demonstrates increased core and shoulder girdle strength. Pt able to hold plank on hands for 90 sec. Pt appears capable of self-management of impairments.   Pt will benefit from skilled therapeutic intervention in order to improve on the following deficits Decreased strength;Decreased mobility;Postural dysfunction;Impaired flexibility;Decreased activity tolerance;Decreased range of motion;Increased muscle spasms   Rehab Potential Good   PT Frequency 2x / week   PT Duration 8 weeks   PT Treatment/Interventions ADLs/Self Care Home Management;Neuromuscular re-education;Electrical Stimulation;Moist Heat;Therapeutic activities;Therapeutic exercise;Manual techniques;Functional mobility training;Patient/family education;Ultrasound;Cryotherapy;Taping   PT Next Visit Plan D/C today: 08/31/15   Consulted and Agree with Plan of Care Patient        Problem List There are no active problems to display for this patient.   Laney Potash 08/31/2015, 3:56 PM  Laney Potash, [place]  Name: Richard Noble MRN: [id number] Date of Birth: 04/17/1966  This note has been reviewed and edited by supervising CI.  [name], PTA,  [facility] [address], [place], [place] [address] [phone] 09/02/2015, 9:55 PM

## 2015-09-03 ENCOUNTER — Ambulatory Visit: Payer: Medicare Other | Admitting: Physical Therapy

## 2015-09-05 ENCOUNTER — Ambulatory Visit: Payer: Medicare Other | Admitting: Physical Therapy

## 2015-09-05 DIAGNOSIS — G894 Chronic pain syndrome: Secondary | ICD-10-CM | POA: Insufficient documentation

## 2015-09-05 DIAGNOSIS — M4802 Spinal stenosis, cervical region: Secondary | ICD-10-CM | POA: Insufficient documentation

## 2015-09-05 DIAGNOSIS — M5030 Other cervical disc degeneration, unspecified cervical region: Secondary | ICD-10-CM | POA: Insufficient documentation

## 2015-09-10 ENCOUNTER — Encounter: Payer: Medicare Other | Admitting: Rehabilitative and Restorative Service Providers"

## 2015-09-10 NOTE — Therapy (Signed)
[facility] [address] [place], [place], [address] Phone: [phone]   Fax:  [phone]  Patient Details  Name: Richard Noble MRN: [id number] Date of Birth: Jul 22, 1966 Referring Provider:  No ref. provider found  Encounter Date: last encounter 08/31/2015  PHYSICAL THERAPY DISCHARGE SUMMARY  Visits from Start of Care: 13  Current functional level related to goals / functional outcomes:     PT Short Term Goals - 08/31/15 0941    PT SHORT TERM GOAL #1   Title The patient will be indep with HEP for scapular mobilization, postural stretching, and gentle stabilization.   Baseline Achieved 08/17/15   Time 4   Period Weeks   Status Achieved   PT SHORT TERM GOAL #2   Title The patient will report pain baseline < or equal to 6/10 without medication mgmt.   Baseline PT has defered this goal due to pt. receiving new RX of Tramadol.    Time 4   Status Deferred   PT SHORT TERM GOAL #3   Title The patient will improve cervical A/ROM rotation to > or equal to 60 deg R and 75 deg L   Baseline Target date 08/19/2015   Time 4   Period Weeks   Status Not Met   PT SHORT TERM GOAL #4   Title The patient will improve bilateral cervical sidebending to > or equal to 25 degrees.   Baseline Achieved 08/17/15   Time 4   Period Weeks   Status Achieved         PT Long Term Goals - 08/31/15 0941    PT LONG TERM GOAL #1   Title The patient will verbalize understanding of sleeping positions for improved spinal support/comfort.   Baseline achieved: 08/31/15   Time 8   Period Weeks   Status Achieved   PT LONG TERM GOAL #2   Title The patient will verbalize community program for post d/c progression of strengthening.   Baseline achieved: 08/31/15   Time 8   Period Weeks   Status Achieved   PT LONG TERM GOAL #3   Title The patient will improve cervical flexion to > or equal to 25 degrees.   Baseline achieved: 08/31/15   Time 8    Period Weeks   Status Achieved   PT LONG TERM GOAL #4   Title The pt will improve L UE strength to 5/5.   Baseline achieved: 08/31/15   Time 8   Period Weeks   Status Achieved   PT LONG TERM GOAL #5   Title The patient will report reduced pain to < or equal to 4/10 at rest.   Baseline achieved: 08/31/15   Time 8   Period Weeks   Status Achieved        Remaining deficits: Pain managed by medication and exercise   Education / Equipment: HEP, gym routine, routine to community wellness.  Plan: Patient agrees to discharge.  Patient goals were met. Patient is being discharged due to meeting the stated rehab goals.  ?????        Thank you for the referral of this patient. Rudell Cobb, MPT   Kenlie Seki 09/10/2015, 7:53 AM  [facility] [address] [place], [place], [address] Phone: [phone]   Fax:  [phone]

## 2015-09-19 ENCOUNTER — Ambulatory Visit (INDEPENDENT_AMBULATORY_CARE_PROVIDER_SITE_OTHER): Payer: Medicare Other | Admitting: Podiatry

## 2015-09-19 ENCOUNTER — Encounter: Payer: Self-pay | Admitting: Podiatry

## 2015-09-19 VITALS — BP 112/65 | HR 63 | Resp 12

## 2015-09-19 DIAGNOSIS — B351 Tinea unguium: Secondary | ICD-10-CM | POA: Diagnosis not present

## 2015-09-19 NOTE — Progress Notes (Signed)
Subjective:     Patient ID: Richard Noble, male   DOB: October 20, 1965, 49 y.o.   MRN: [id number]  HPI this patient presents to the office with chief complaint of a painful big toenail, left foot. He states that he's had fungus in the toenail on the left big toe  which she has treated with topical medication. He states the nail has started to grow into the corner on the inside border the big toe.the left foot. He states he has pain walking wearing his shoes. He states he is usually able to remove the offending nail, but he has been unable to at this time. He presents to the office for an evaluation and treatment of this condition   Review of Systems     Objective:   Physical Exam GENERAL APPEARANCE: Alert, conversant. Appropriately groomed. No acute distress.  VASCULAR: Pedal pulses palpable at  [facility] and PT bilateral.  Capillary refill time is immediate to all digits,  Normal temperature gradient.  Digital hair growth is present bilateral  NEUROLOGIC: sensation is normal to 5.07 monofilament at 5/5 sites bilateral.  Light touch is intact bilateral, Muscle strength normal.  MUSCULOSKELETAL: acceptable muscle strength, tone and stability bilateral.  Intrinsic muscluature intact bilateral.  Rectus appearance of foot and digits noted bilateral.   DERMATOLOGIC: skin color, texture, and turgor are within normal limits.  No preulcerative lesions or ulcers  are seen, no interdigital maceration noted.  No open lesions present.  . No drainage noted.NAILS  Thickincurvated nail along the medial left border left great toe.      Assessment:     Onychomycosis Left hallux     Plan:     IE  To schedule him for nail surgery in January since he is going out of town to [place].  Gardiner Barefoot DPM

## 2015-09-26 ENCOUNTER — Encounter: Payer: Self-pay | Admitting: Family Medicine

## 2015-09-27 DIAGNOSIS — F419 Anxiety disorder, unspecified: Secondary | ICD-10-CM | POA: Insufficient documentation

## 2015-09-27 DIAGNOSIS — M961 Postlaminectomy syndrome, not elsewhere classified: Secondary | ICD-10-CM | POA: Insufficient documentation

## 2015-09-28 ENCOUNTER — Ambulatory Visit (INDEPENDENT_AMBULATORY_CARE_PROVIDER_SITE_OTHER): Payer: Medicare Other | Admitting: Family Medicine

## 2015-09-28 ENCOUNTER — Encounter: Payer: Self-pay | Admitting: Family Medicine

## 2015-09-28 VITALS — BP 119/62 | HR 69 | Temp 97.8°F | Wt 140.0 lb

## 2015-09-28 DIAGNOSIS — M5030 Other cervical disc degeneration, unspecified cervical region: Secondary | ICD-10-CM

## 2015-09-28 DIAGNOSIS — E119 Type 2 diabetes mellitus without complications: Secondary | ICD-10-CM

## 2015-09-28 DIAGNOSIS — M5136 Other intervertebral disc degeneration, lumbar region: Secondary | ICD-10-CM | POA: Insufficient documentation

## 2015-09-28 DIAGNOSIS — G4700 Insomnia, unspecified: Secondary | ICD-10-CM | POA: Diagnosis not present

## 2015-09-28 DIAGNOSIS — Z23 Encounter for immunization: Secondary | ICD-10-CM

## 2015-09-28 NOTE — Assessment & Plan Note (Signed)
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 Overview updated to reflect previous imaging. Currently managed by [facility] (Dr. Marlaine Hind) and [facility] (Dr. Wess Botts).  -Tramadol prescribed by Dr. Wess Botts -Patient informed that I would be unable to refill benzodiazepines/muscle relaxers on first visit.  -Return visit planned.
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
## 2015-09-28 NOTE — Assessment & Plan Note (Signed)
History of Lumbar DDD s/p L5/S1 fusion in 2012. Overview updated to reflect previous imaging. Currently managed by [facility] (Dr. Marlaine Hind) and [facility] (Dr. Wess Botts).  -Tramadol prescribed by Dr. Wess Botts -Patient informed that I would be unable to refill benzodiazepines/muscle relaxers on first visit.  -Return visit planned.

## 2015-09-28 NOTE — Patient Instructions (Signed)
It was nice to meet you today.   Please make a follow up appointment to meet your new PCP and to get refills of your medications.   Dr. Ree Kida will review your records and call you if she has questions.

## 2015-09-28 NOTE — Assessment & Plan Note (Signed)
Insomnia due to MSK pain -patient notified that I would be unable to refill this medication on his initial visit -follow up visit planned

## 2015-09-28 NOTE — Assessment & Plan Note (Signed)
Controlled based on A1C of 6.0% on 02/02/15. -continue lifestyle modifications -check A1C at follow up visits

## 2015-10-16 DIAGNOSIS — M961 Postlaminectomy syndrome, not elsewhere classified: Secondary | ICD-10-CM | POA: Diagnosis not present

## 2015-10-16 DIAGNOSIS — M791 Myalgia: Secondary | ICD-10-CM | POA: Diagnosis not present

## 2015-10-22 ENCOUNTER — Ambulatory Visit: Payer: Medicare Other | Admitting: Family Medicine

## 2015-10-25 ENCOUNTER — Telehealth: Payer: Self-pay | Admitting: Family Medicine

## 2015-10-25 NOTE — Telephone Encounter (Signed)
Needs refill on ambien and diazapan.  Had an appt on 1-9. Now has appt on jan 31. [facility] [address]

## 2015-10-26 ENCOUNTER — Ambulatory Visit (INDEPENDENT_AMBULATORY_CARE_PROVIDER_SITE_OTHER): Payer: Medicare Other | Admitting: Podiatry

## 2015-10-26 ENCOUNTER — Ambulatory Visit: Payer: Medicare Other | Admitting: Podiatry

## 2015-10-26 ENCOUNTER — Encounter: Payer: Self-pay | Admitting: Podiatry

## 2015-10-26 DIAGNOSIS — L600 Ingrowing nail: Secondary | ICD-10-CM | POA: Diagnosis not present

## 2015-10-26 NOTE — Patient Instructions (Signed)

## 2015-10-28 NOTE — Telephone Encounter (Addendum)
Unfortunately I have not met this patient yet and will be unable to prescribe Ambien or benzodiazepines. Will be happy to see patient on January 31 to discuss medication prescriptions. Please inform him that if he is in extreme pain to go to urgent care or emergency department. Thank you.   Smitty Cords, MD [place], PGY-1

## 2015-10-28 NOTE — Progress Notes (Signed)
Subjective:     Patient ID: Richard Noble, male   DOB: 11/24/1965, 50 y.o.   MRN: [id number]  HPI patient presents with a painful ingrown toenail left big toe stating that she's tried to trim it herself and soak it   Review of Systems     Objective:   Physical Exam Neurovascular status intact muscle strength adequate with ingrown toenail deformity left hallux lateral border that's painful when pressed    Assessment:     Ingrown toenail deformity left hallux with pain    Plan:     H&P condition reviewed and recommended long-term treatment. I explained risk and at this time I infiltrated the left big toe 60 mg I can Marcaine mixture remove the border exposed matrix and applied phenol 3 applications 30 seconds followed by alcohol lavage and sterile dressing. Gave instructions on soaks and reappoint

## 2015-10-31 ENCOUNTER — Ambulatory Visit: Payer: Medicare Other | Admitting: Podiatry

## 2015-11-07 ENCOUNTER — Encounter: Payer: Self-pay | Admitting: Family Medicine
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
## 2015-11-12 NOTE — Progress Notes (Signed)
Noted.  ~Jeannette Richardson, BSN, RN-BC  

## 2015-11-13 ENCOUNTER — Ambulatory Visit: Payer: Medicare Other | Admitting: Family Medicine

## 2015-11-29 LAB — BASIC METABOLIC PANEL
BUN: 14 mg/dL (ref 4–21)
Creatinine: 0.8 mg/dL (ref <=1.3)
Glucose: 109 mg/dL
Potassium: 4.5 mmol/L (ref 3.4–5.3)
Sodium: 139 mmol/L (ref 137–147)

## 2015-11-29 LAB — LIPID PANEL
Cholesterol: 205 mg/dL — AB (ref 0–200)
HDL: 30 mg/dL — AB (ref 35–70)
LDL Cholesterol: 55 mg/dL
Triglycerides: 273 mg/dL — AB (ref 40–160)

## 2015-11-29 LAB — HEPATIC FUNCTION PANEL
ALT: 47 U/L — AB (ref 10–40)
AST: 24 U/L (ref 14–40)
Alkaline Phosphatase: 38 U/L (ref 25–125)
Bilirubin, Total: 1 mg/dL

## 2015-11-29 LAB — HEMOGLOBIN A1C: Hemoglobin A1C: 6.1

## 2016-03-18 DIAGNOSIS — F1120 Opioid dependence, uncomplicated: Secondary | ICD-10-CM | POA: Insufficient documentation

## 2016-03-27 LAB — TESTOSTERONE: Testosterone: 351

## 2016-03-27 LAB — BASIC METABOLIC PANEL
BUN: 19 mg/dL (ref 4–21)
Creatinine: 0.8 mg/dL (ref <=1.3)
Glucose: 98 mg/dL
Potassium: 4.2 mmol/L (ref 3.4–5.3)
Sodium: 139 mmol/L (ref 137–147)

## 2016-03-27 LAB — HEPATIC FUNCTION PANEL
ALT: 23 U/L (ref 10–40)
AST: 19 U/L (ref 14–40)
Alkaline Phosphatase: 38 U/L (ref 25–125)
Bilirubin, Total: 1 mg/dL

## 2016-03-27 LAB — LIPID PANEL
Cholesterol: 163 mg/dL (ref 0–200)
HDL: 32 mg/dL — AB (ref 35–70)
LDL Cholesterol: 111 mg/dL
Triglycerides: 100 mg/dL (ref 40–160)

## 2016-08-13 ENCOUNTER — Other Ambulatory Visit: Payer: Self-pay | Admitting: Neurological Surgery

## 2016-08-27 ENCOUNTER — Encounter (HOSPITAL_COMMUNITY)
Admission: RE | Admit: 2016-08-27 | Discharge: 2016-08-27 | Disposition: A | Discharge status: Home / Self Care | Payer: Medicare Other | Source: Ambulatory Visit | Attending: Neurological Surgery | Admitting: Neurological Surgery

## 2016-08-27 ENCOUNTER — Encounter (HOSPITAL_COMMUNITY): Payer: Self-pay

## 2016-08-27 DIAGNOSIS — Z01812 Encounter for preprocedural laboratory examination: Secondary | ICD-10-CM | POA: Insufficient documentation

## 2016-08-27 DIAGNOSIS — M4802 Spinal stenosis, cervical region: Secondary | ICD-10-CM | POA: Diagnosis not present

## 2016-08-27 HISTORY — DX: Personal history of urinary calculi: Z87.442

## 2016-08-27 HISTORY — DX: Persons encountering health services in other specified circumstances: Z76.89

## 2016-08-27 HISTORY — DX: Unspecified osteoarthritis, unspecified site: M19.90

## 2016-08-27 HISTORY — DX: Anxiety disorder, unspecified: F41.9

## 2016-08-27 HISTORY — DX: Personal history of other medical treatment: Z92.89

## 2016-08-27 LAB — BASIC METABOLIC PANEL
Anion gap: 7 (ref 5–15)
BUN: 17 mg/dL (ref 6–20)
CO2: 28 mmol/L (ref 22–32)
Calcium: 9.6 mg/dL (ref 8.9–10.3)
Chloride: 104 mmol/L (ref 101–111)
Creatinine, Ser: 0.87 mg/dL (ref 0.61–1.24)
GFR calc Af Amer: >60 mL/min (ref >60)
GFR calc non Af Amer: >60 mL/min (ref >60)
Glucose, Bld: 136 mg/dL — ABNORMAL HIGH (ref 65–99)
Potassium: 4.1 mmol/L (ref 3.5–5.1)
Sodium: 139 mmol/L (ref 135–145)

## 2016-08-27 LAB — CBC
HCT: 40 % (ref 39.0–52.0)
Hemoglobin: 13.8 g/dL (ref 13.0–17.0)
MCH: 28.8 pg (ref 26.0–34.0)
MCHC: 34.5 g/dL (ref 30.0–36.0)
MCV: 83.5 fL (ref 78.0–100.0)
Platelets: 212 10*3/uL (ref 150–400)
RBC: 4.79 MIL/uL (ref 4.22–5.81)
RDW: 12.5 % (ref 11.5–15.5)
WBC: 6.5 10*3/uL (ref 4.0–10.5)

## 2016-08-27 LAB — SURGICAL PCR SCREEN
MRSA, PCR: NEGATIVE
Staphylococcus aureus: NEGATIVE

## 2016-08-27 NOTE — Pre-Procedure Instructions (Signed)
Richard Noble  08/27/2016      [id number] [place], [place] [address] Phone: [phone] Fax: [phone]    Your procedure is scheduled on 09/01/2016  Report to [facility] Admitting at 08:30 A.M.  Call this number if you have problems the morning of surgery:  [phone]   Remember:  Do not eat food or drink liquids after midnight.  On Sunday NIGHT  Take these medicines the morning of surgery with A SIP OF WATER : if needed can take Valium, can take Tramadol and or tylenol- also if needed    Do not wear jewelry   Do not wear lotions, powders, or perfumes, or deoderant.               Men may shave face and neck.   Do not bring valuables to the hospital.   [facility] is not responsible for any belongings or valuables.  Contacts, dentures or bridgework may not be worn into surgery.  Leave your suitcase in the car.  After surgery it may be brought to your room.  For patients admitted to the hospital, discharge time will be determined by your treatment team.  Patients discharged the day of surgery will not be allowed to drive home.   Name and phone number of your driver:   Spouse   Special instructions:  Special Instructions: [facility] - Preparing for Surgery  Before surgery, you can play an important role.  Because skin is not sterile, your skin needs to be as free of germs as possible.  You can reduce the number of germs on you skin by washing with CHG (chlorahexidine gluconate) soap before surgery.  CHG is an antiseptic cleaner which kills germs and bonds with the skin to continue killing germs even after washing.  Please DO NOT use if you have an allergy to CHG or antibacterial soaps.  If your skin becomes reddened/irritated stop using the CHG and inform your nurse when you arrive at Short Stay.  Do not shave (including legs and underarms) for at least 48 hours prior to the first CHG  shower.  You may shave your face.  Please follow these instructions carefully:   1.  Shower with CHG Soap the night before surgery and the  morning of Surgery.  2.  If you choose to wash your hair, wash your hair first as usual with your  normal shampoo.  3.  After you shampoo, rinse your hair and body thoroughly to remove the  Shampoo.  4.  Use CHG as you would any other liquid soap.  You can apply chg directly to the skin and wash gently with scrungie or a clean washcloth.  5.  Apply the CHG Soap to your body ONLY FROM THE NECK DOWN.    Do not use on open wounds or open sores.  Avoid contact with your eyes, ears, mouth and genitals (private parts).  Wash genitals (private parts)   with your normal soap.  6.  Wash thoroughly, paying special attention to the area where your surgery will be performed.  7.  Thoroughly rinse your body with warm water from the neck down.  8.  DO NOT shower/wash with your normal soap after using and rinsing off   the CHG Soap.  9.  Pat yourself dry with a clean towel.            10 .  Wear clean  pajamas.            11.  Place clean sheets on your bed the night of your first shower and do not sleep with pets.  Day of Surgery  Do not apply any lotions/deodorants the morning of surgery.  Please wear clean clothes to the hospital/surgery center.  Please read over the following fact sheets that you were given. Pain Booklet, Coughing and Deep Breathing, MRSA Information and Surgical Site Infection Prevention

## 2016-08-27 NOTE — Progress Notes (Signed)
Pt. Reports having a stress 20 + yrs. Ago, & then reports that he was followed for a while with cardiologist but told that he was cleared & didn't need any further attention regarding his heart.  Pt. Lived in [place] state at the time.  Pt. Reports that his chest is unchanged, no breathing concerns. Followed by Dr. Owens Shark with [facility] here in [place]. Diabetes is diet controlled, on Metformin at first but stopped per MD guidance in 2010.

## 2016-08-28 LAB — HEMOGLOBIN A1C
Hgb A1c MFr Bld: 5.9 % — ABNORMAL HIGH (ref 4.8–5.6)
Mean Plasma Glucose: 123 mg/dL

## 2016-08-31 MED ORDER — CHLORHEXIDINE GLUCONATE CLOTH 2 % EX PADS: 6.0000 | MEDICATED_PAD | Freq: Once | CUTANEOUS | Status: DC

## 2016-08-31 MED ORDER — CEFAZOLIN SODIUM-DEXTROSE 2-4 GM/100ML-% IV SOLN
2.0000 g | INTRAVENOUS | Status: AC
  Administered 2016-09-01: 2 g via INTRAVENOUS
  Filled 2016-08-31: qty 100

## 2016-09-01 ENCOUNTER — Ambulatory Visit (HOSPITAL_COMMUNITY): Payer: Medicare Other | Admitting: Anesthesiology

## 2016-09-01 ENCOUNTER — Encounter (HOSPITAL_COMMUNITY)
Admission: RE | Disposition: A | Discharge status: Home / Self Care | Payer: Self-pay | Source: Ambulatory Visit | Attending: Neurological Surgery

## 2016-09-01 ENCOUNTER — Ambulatory Visit (HOSPITAL_COMMUNITY)
Admission: RE | Admit: 2016-09-01 | Discharge: 2016-09-02 | Disposition: A | Discharge status: Home / Self Care | Payer: Medicare Other | Source: Ambulatory Visit | Attending: Neurological Surgery | Admitting: Neurological Surgery

## 2016-09-01 ENCOUNTER — Ambulatory Visit (HOSPITAL_COMMUNITY): Payer: Medicare Other

## 2016-09-01 ENCOUNTER — Encounter (HOSPITAL_COMMUNITY): Payer: Self-pay | Admitting: Anesthesiology

## 2016-09-01 DIAGNOSIS — E119 Type 2 diabetes mellitus without complications: Secondary | ICD-10-CM | POA: Diagnosis not present

## 2016-09-01 DIAGNOSIS — F419 Anxiety disorder, unspecified: Secondary | ICD-10-CM | POA: Diagnosis not present

## 2016-09-01 DIAGNOSIS — Z885 Allergy status to narcotic agent status: Secondary | ICD-10-CM | POA: Insufficient documentation

## 2016-09-01 DIAGNOSIS — Z419 Encounter for procedure for purposes other than remedying health state, unspecified: Secondary | ICD-10-CM

## 2016-09-01 DIAGNOSIS — M4722 Other spondylosis with radiculopathy, cervical region: Secondary | ICD-10-CM | POA: Diagnosis not present

## 2016-09-01 DIAGNOSIS — M4802 Spinal stenosis, cervical region: Secondary | ICD-10-CM | POA: Diagnosis not present

## 2016-09-01 DIAGNOSIS — M1990 Unspecified osteoarthritis, unspecified site: Secondary | ICD-10-CM | POA: Diagnosis not present

## 2016-09-01 DIAGNOSIS — Z87442 Personal history of urinary calculi: Secondary | ICD-10-CM | POA: Insufficient documentation

## 2016-09-01 HISTORY — PX: ANTERIOR CERVICAL DECOMP/DISCECTOMY FUSION: SHX1161

## 2016-09-01 LAB — GLUCOSE, CAPILLARY
Glucose-Capillary: 117 mg/dL — ABNORMAL HIGH (ref 65–99)
Glucose-Capillary: 131 mg/dL — ABNORMAL HIGH (ref 65–99)
Glucose-Capillary: 199 mg/dL — ABNORMAL HIGH (ref 65–99)

## 2016-09-01 SURGERY — ANTERIOR CERVICAL DECOMPRESSION/DISCECTOMY FUSION 2 LEVELS
Anesthesia: General | Site: Neck

## 2016-09-01 MED ORDER — PROPOFOL 10 MG/ML IV BOLUS
INTRAVENOUS | Status: AC
  Filled 2016-09-01: qty 20

## 2016-09-01 MED ORDER — BUPIVACAINE HCL (PF) 0.5 % IJ SOLN
INTRAMUSCULAR | Status: AC
  Filled 2016-09-01: qty 30

## 2016-09-01 MED ORDER — ACETAMINOPHEN 500 MG PO TABS
1000.0000 mg | ORAL_TABLET | Freq: Four times a day (QID) | ORAL | Status: DC
  Administered 2016-09-01 – 2016-09-02 (×3): 1000 mg via ORAL
  Filled 2016-09-01 (×3): qty 2

## 2016-09-01 MED ORDER — ZOLPIDEM TARTRATE 5 MG PO TABS
10.0000 mg | ORAL_TABLET | Freq: Every day | ORAL | Status: DC
  Filled 2016-09-01: qty 2

## 2016-09-01 MED ORDER — LACTATED RINGERS IV SOLN
INTRAVENOUS | Status: DC
  Administered 2016-09-01: 10:00:00 via INTRAVENOUS

## 2016-09-01 MED ORDER — MIDAZOLAM HCL 2 MG/2ML IJ SOLN
INTRAMUSCULAR | Status: AC
  Filled 2016-09-01: qty 2

## 2016-09-01 MED ORDER — SUGAMMADEX SODIUM 200 MG/2ML IV SOLN
INTRAVENOUS | Status: DC | PRN
  Administered 2016-09-01: 200 mg via INTRAVENOUS

## 2016-09-01 MED ORDER — LIDOCAINE-EPINEPHRINE (PF) 2 %-1:200000 IJ SOLN
INTRAMUSCULAR | Status: DC | PRN
  Administered 2016-09-01: 5 mL

## 2016-09-01 MED ORDER — ROCURONIUM BROMIDE 100 MG/10ML IV SOLN
INTRAVENOUS | Status: DC | PRN
  Administered 2016-09-01: 10 mg via INTRAVENOUS
  Administered 2016-09-01: 50 mg via INTRAVENOUS

## 2016-09-01 MED ORDER — PHENYLEPHRINE HCL 10 MG/ML IJ SOLN
INTRAVENOUS | Status: DC | PRN
  Administered 2016-09-01: 20 ug/min via INTRAVENOUS

## 2016-09-01 MED ORDER — THROMBIN 5000 UNITS EX SOLR
CUTANEOUS | Status: AC
  Filled 2016-09-01: qty 15000

## 2016-09-01 MED ORDER — OXYCODONE HCL 5 MG PO TABS
ORAL_TABLET | ORAL | Status: AC
  Filled 2016-09-01: qty 2

## 2016-09-01 MED ORDER — LIDOCAINE HCL (CARDIAC) 20 MG/ML IV SOLN
INTRAVENOUS | Status: DC | PRN
  Administered 2016-09-01: 60 mg via INTRAVENOUS

## 2016-09-01 MED ORDER — SODIUM CHLORIDE 0.9% FLUSH: 3.0000 mL | Freq: Two times a day (BID) | INTRAVENOUS | Status: DC

## 2016-09-01 MED ORDER — SENNA 8.6 MG PO TABS
1.0000 | ORAL_TABLET | Freq: Two times a day (BID) | ORAL | Status: DC
  Administered 2016-09-01 – 2016-09-02 (×2): 8.6 mg via ORAL
  Filled 2016-09-01 (×2): qty 1

## 2016-09-01 MED ORDER — METHOCARBAMOL 750 MG PO TABS
750.0000 mg | ORAL_TABLET | Freq: Four times a day (QID) | ORAL | Status: DC
  Administered 2016-09-01 – 2016-09-02 (×3): 750 mg via ORAL
  Filled 2016-09-01 (×4): qty 1

## 2016-09-01 MED ORDER — PROPOFOL 10 MG/ML IV BOLUS
INTRAVENOUS | Status: DC | PRN
  Administered 2016-09-01: 150 mg via INTRAVENOUS

## 2016-09-01 MED ORDER — FENTANYL CITRATE (PF) 100 MCG/2ML IJ SOLN
INTRAMUSCULAR | Status: DC | PRN
  Administered 2016-09-01 (×4): 50 ug via INTRAVENOUS

## 2016-09-01 MED ORDER — CELECOXIB 200 MG PO CAPS
200.0000 mg | ORAL_CAPSULE | Freq: Two times a day (BID) | ORAL | Status: DC
  Administered 2016-09-02: 200 mg via ORAL
  Filled 2016-09-01 (×2): qty 1

## 2016-09-01 MED ORDER — ONDANSETRON HCL 4 MG/2ML IJ SOLN
INTRAMUSCULAR | Status: DC | PRN
  Administered 2016-09-01: 4 mg via INTRAVENOUS

## 2016-09-01 MED ORDER — FENTANYL CITRATE (PF) 100 MCG/2ML IJ SOLN
INTRAMUSCULAR | Status: AC
  Filled 2016-09-01: qty 4

## 2016-09-01 MED ORDER — OXYCODONE HCL 5 MG PO TABS
5.0000 mg | ORAL_TABLET | ORAL | Status: DC | PRN
  Administered 2016-09-01 – 2016-09-02 (×5): 10 mg via ORAL
  Filled 2016-09-01 (×5): qty 2

## 2016-09-01 MED ORDER — BUPIVACAINE HCL (PF) 0.5 % IJ SOLN
INTRAMUSCULAR | Status: DC | PRN
  Administered 2016-09-01: 7 mL
  Administered 2016-09-01: 5 mL

## 2016-09-01 MED ORDER — PROMETHAZINE HCL 25 MG/ML IJ SOLN: 6.2500 mg | INTRAMUSCULAR | Status: DC | PRN

## 2016-09-01 MED ORDER — DIAZEPAM 2 MG PO TABS
2.0000 mg | ORAL_TABLET | Freq: Four times a day (QID) | ORAL | Status: DC | PRN
  Administered 2016-09-01 – 2016-09-02 (×2): 2 mg via ORAL
  Filled 2016-09-01 (×2): qty 1

## 2016-09-01 MED ORDER — SODIUM CHLORIDE 0.9 % IV SOLN: INTRAVENOUS | Status: DC

## 2016-09-01 MED ORDER — FENTANYL CITRATE (PF) 100 MCG/2ML IJ SOLN
INTRAMUSCULAR | Status: AC
  Filled 2016-09-01: qty 2

## 2016-09-01 MED ORDER — THROMBIN 5000 UNITS EX SOLR
CUTANEOUS | Status: AC
  Filled 2016-09-01: qty 5000

## 2016-09-01 MED ORDER — ACETAMINOPHEN 10 MG/ML IV SOLN: 1000.0000 mg | Freq: Four times a day (QID) | INTRAVENOUS | Status: DC

## 2016-09-01 MED ORDER — FLEET ENEMA 7-19 GM/118ML RE ENEM: 1.0000 | ENEMA | Freq: Once | RECTAL | Status: DC | PRN

## 2016-09-01 MED ORDER — ACETAMINOPHEN 10 MG/ML IV SOLN
INTRAVENOUS | Status: AC
  Administered 2016-09-01: 1000 mg
  Filled 2016-09-01: qty 100

## 2016-09-01 MED ORDER — DEXAMETHASONE SODIUM PHOSPHATE 10 MG/ML IJ SOLN
INTRAMUSCULAR | Status: DC | PRN
  Administered 2016-09-01: 10 mg via INTRAVENOUS

## 2016-09-01 MED ORDER — FENTANYL CITRATE (PF) 100 MCG/2ML IJ SOLN
INTRAMUSCULAR | Status: AC
  Administered 2016-09-01: 50 ug
  Filled 2016-09-01: qty 2

## 2016-09-01 MED ORDER — ZOLPIDEM TARTRATE 5 MG PO TABS
5.0000 mg | ORAL_TABLET | Freq: Every evening | ORAL | Status: DC | PRN
  Administered 2016-09-01: 5 mg via ORAL
  Filled 2016-09-01: qty 1

## 2016-09-01 MED ORDER — SODIUM CHLORIDE 0.9 % IR SOLN
Status: DC | PRN
  Administered 2016-09-01: 13:00:00

## 2016-09-01 MED ORDER — LIDOCAINE-EPINEPHRINE (PF) 2 %-1:200000 IJ SOLN
INTRAMUSCULAR | Status: AC
  Filled 2016-09-01: qty 20

## 2016-09-01 MED ORDER — SODIUM CHLORIDE 0.9 % IV SOLN: 250.0000 mL | INTRAVENOUS | Status: DC

## 2016-09-01 MED ORDER — DICLOFENAC EPOLAMINE 1.3 % TD PTCH
1.0000 | MEDICATED_PATCH | Freq: Two times a day (BID) | TRANSDERMAL | Status: DC
  Administered 2016-09-01: 1 via TRANSDERMAL
  Filled 2016-09-01 (×2): qty 1

## 2016-09-01 MED ORDER — DIAZEPAM 5 MG/ML IJ SOLN
INTRAMUSCULAR | Status: AC
  Filled 2016-09-01: qty 2

## 2016-09-01 MED ORDER — OXYCODONE HCL ER 20 MG PO T12A
20.0000 mg | EXTENDED_RELEASE_TABLET | Freq: Two times a day (BID) | ORAL | Status: DC
  Administered 2016-09-01 – 2016-09-02 (×2): 20 mg via ORAL
  Filled 2016-09-01 (×2): qty 1

## 2016-09-01 MED ORDER — ONDANSETRON HCL 4 MG/2ML IJ SOLN
4.0000 mg | INTRAMUSCULAR | Status: DC | PRN
Start: 2016-09-01 — End: 2016-09-02

## 2016-09-01 MED ORDER — DOCUSATE SODIUM 100 MG PO CAPS
100.0000 mg | ORAL_CAPSULE | Freq: Two times a day (BID) | ORAL | Status: DC
  Administered 2016-09-01 – 2016-09-02 (×2): 100 mg via ORAL
  Filled 2016-09-01 (×2): qty 1

## 2016-09-01 MED ORDER — HEMOSTATIC AGENTS (NO CHARGE) OPTIME
TOPICAL | Status: DC | PRN
  Administered 2016-09-01: 1 via TOPICAL

## 2016-09-01 MED ORDER — PANTOPRAZOLE SODIUM 40 MG IV SOLR
40.0000 mg | Freq: Every day | INTRAVENOUS | Status: DC
  Administered 2016-09-01: 40 mg via INTRAVENOUS
  Filled 2016-09-01: qty 40

## 2016-09-01 MED ORDER — GABAPENTIN 300 MG PO CAPS
300.0000 mg | ORAL_CAPSULE | Freq: Three times a day (TID) | ORAL | Status: DC
  Administered 2016-09-01 – 2016-09-02 (×3): 300 mg via ORAL
  Filled 2016-09-01 (×3): qty 1

## 2016-09-01 MED ORDER — TURMERIC POWD: Freq: Two times a day (BID) | Status: DC

## 2016-09-01 MED ORDER — SODIUM CHLORIDE 0.9% FLUSH: 3.0000 mL | INTRAVENOUS | Status: DC | PRN

## 2016-09-01 MED ORDER — THROMBIN 5000 UNITS EX SOLR
OROMUCOSAL | Status: DC | PRN
  Administered 2016-09-01 (×2): via TOPICAL

## 2016-09-01 MED ORDER — MENTHOL 3 MG MT LOZG: 1.0000 | LOZENGE | OROMUCOSAL | Status: DC | PRN

## 2016-09-01 MED ORDER — BISACODYL 10 MG RE SUPP: 10.0000 mg | Freq: Every day | RECTAL | Status: DC | PRN

## 2016-09-01 MED ORDER — DIAZEPAM 5 MG/ML IJ SOLN
2.0000 mg | Freq: Once | INTRAMUSCULAR | Status: AC
  Administered 2016-09-01: 2 mg via INTRAVENOUS

## 2016-09-01 MED ORDER — PHENOL 1.4 % MT LIQD: 1.0000 | OROMUCOSAL | Status: DC | PRN

## 2016-09-01 MED ORDER — DEXAMETHASONE SODIUM PHOSPHATE 4 MG/ML IJ SOLN
2.0000 mg | Freq: Three times a day (TID) | INTRAMUSCULAR | Status: AC
  Administered 2016-09-01 (×2): 2 mg via INTRAVENOUS
  Filled 2016-09-01 (×2): qty 1

## 2016-09-01 MED ORDER — LACTATED RINGERS IV SOLN
INTRAVENOUS | Status: DC | PRN
  Administered 2016-09-01 (×2): via INTRAVENOUS

## 2016-09-01 MED ORDER — 0.9 % SODIUM CHLORIDE (POUR BTL) OPTIME
TOPICAL | Status: DC | PRN
  Administered 2016-09-01: 1000 mL

## 2016-09-01 MED ORDER — THROMBIN 5000 UNITS EX SOLR
CUTANEOUS | Status: DC | PRN
  Administered 2016-09-01 (×2): 5000 [IU] via TOPICAL

## 2016-09-01 MED ORDER — CEFAZOLIN IN D5W 1 GM/50ML IV SOLN
1.0000 g | Freq: Three times a day (TID) | INTRAVENOUS | Status: AC
  Administered 2016-09-01 – 2016-09-02 (×2): 1 g via INTRAVENOUS
  Filled 2016-09-01 (×2): qty 50

## 2016-09-01 MED ORDER — FENTANYL CITRATE (PF) 100 MCG/2ML IJ SOLN
25.0000 ug | INTRAMUSCULAR | Status: DC | PRN
  Administered 2016-09-01 (×3): 50 ug via INTRAVENOUS

## 2016-09-01 MED ORDER — FENTANYL CITRATE (PF) 100 MCG/2ML IJ SOLN
25.0000 ug | INTRAMUSCULAR | Status: DC | PRN
  Administered 2016-09-01 (×2): 50 ug via INTRAVENOUS

## 2016-09-01 MED ORDER — MIDAZOLAM HCL 5 MG/5ML IJ SOLN
INTRAMUSCULAR | Status: DC | PRN
  Administered 2016-09-01: 2 mg via INTRAVENOUS

## 2016-09-01 SURGICAL SUPPLY — 76 items
APPLIER CLIP 9.375 MED OPEN (MISCELLANEOUS) ×3
BAG DECANTER FOR FLEXI CONT (MISCELLANEOUS) ×3 IMPLANT
BIT DRILL NEURO 2X3.1 SFT TUCH (MISCELLANEOUS) ×1 IMPLANT
BIT DRILL POWER (BIT) ×1 IMPLANT
BLADE SURG 11 STRL SS (BLADE) ×3 IMPLANT
BLADE ULTRA TIP 2M (BLADE) IMPLANT
BUR MATCHSTICK NEURO 3.0 LAGG (BURR) ×3 IMPLANT
CAGE COROENT SM ACR 8X19X16-10 (Cage) ×3 IMPLANT
CAGE COROENT SM ACR 9X19X16-10 (Cage) ×3 IMPLANT
CANISTER SUCT 3000ML PPV (MISCELLANEOUS) ×3 IMPLANT
CARTRIDGE OIL MAESTRO DRILL (MISCELLANEOUS) ×1 IMPLANT
CHLORAPREP W/TINT 26ML (MISCELLANEOUS) ×3 IMPLANT
CLIP APPLIE 9.375 MED OPEN (MISCELLANEOUS) ×1 IMPLANT
DECANTER SPIKE VIAL GLASS SM (MISCELLANEOUS) ×6 IMPLANT
DERMABOND ADVANCED (GAUZE/BANDAGES/DRESSINGS) ×2
DERMABOND ADVANCED .7 DNX12 (GAUZE/BANDAGES/DRESSINGS) ×1 IMPLANT
DIFFUSER DRILL AIR PNEUMATIC (MISCELLANEOUS) ×3 IMPLANT
DRAPE C-ARM 42X72 X-RAY (DRAPES) ×6 IMPLANT
DRAPE HALF SHEET 40X57 (DRAPES) ×3 IMPLANT
DRAPE LAPAROTOMY 100X72 PEDS (DRAPES) ×3 IMPLANT
DRAPE MICROSCOPE LEICA (MISCELLANEOUS) ×3 IMPLANT
DRAPE POUCH INSTRU U-SHP 10X18 (DRAPES) ×3 IMPLANT
DRAPE SHEET LG 3/4 BI-LAMINATE (DRAPES) ×3 IMPLANT
DRILL BIT POWER (BIT) ×2
DRILL NEURO 2X3.1 SOFT TOUCH (MISCELLANEOUS) ×3
DRSG OPSITE POSTOP 4X6 (GAUZE/BANDAGES/DRESSINGS) ×3 IMPLANT
ELECT COATED BLADE 2.86 ST (ELECTRODE) ×3 IMPLANT
ELECT REM PT RETURN 9FT ADLT (ELECTROSURGICAL) ×3
ELECTRODE REM PT RTRN 9FT ADLT (ELECTROSURGICAL) ×1 IMPLANT
EVACUATOR 1/8 PVC DRAIN (DRAIN) ×3 IMPLANT
GAUZE SPONGE 4X4 12PLY STRL (GAUZE/BANDAGES/DRESSINGS) IMPLANT
GAUZE SPONGE 4X4 16PLY XRAY LF (GAUZE/BANDAGES/DRESSINGS) IMPLANT
GLOVE BIOGEL PI IND STRL 6.5 (GLOVE) ×1 IMPLANT
GLOVE BIOGEL PI IND STRL 7.0 (GLOVE) ×1 IMPLANT
GLOVE BIOGEL PI IND STRL 7.5 (GLOVE) ×1 IMPLANT
GLOVE BIOGEL PI INDICATOR 6.5 (GLOVE) ×2
GLOVE BIOGEL PI INDICATOR 7.0 (GLOVE) ×2
GLOVE BIOGEL PI INDICATOR 7.5 (GLOVE) ×2
GLOVE ECLIPSE 9.0 STRL (GLOVE) ×3 IMPLANT
GLOVE SS BIOGEL STRL SZ 7.5 (GLOVE) ×4 IMPLANT
GLOVE SUPERSENSE BIOGEL SZ 7.5 (GLOVE) ×8
GLOVE SURG SS PI 6.5 STRL IVOR (GLOVE) ×9 IMPLANT
GOWN STRL REUS W/ TWL LRG LVL3 (GOWN DISPOSABLE) ×2 IMPLANT
GOWN STRL REUS W/ TWL XL LVL3 (GOWN DISPOSABLE) ×1 IMPLANT
GOWN STRL REUS W/TWL LRG LVL3 (GOWN DISPOSABLE) ×4
GOWN STRL REUS W/TWL XL LVL3 (GOWN DISPOSABLE) ×2
HEMOSTAT POWDER KIT SURGIFOAM (HEMOSTASIS) ×6 IMPLANT
KIT BASIN OR (CUSTOM PROCEDURE TRAY) ×3 IMPLANT
KIT ROOM TURNOVER OR (KITS) ×3 IMPLANT
NEEDLE HYPO 21X1.5 SAFETY (NEEDLE) ×6 IMPLANT
NEEDLE SPNL 22GX3.5 QUINCKE BK (NEEDLE) ×3 IMPLANT
NS IRRIG 1000ML POUR BTL (IV SOLUTION) ×3 IMPLANT
OIL CARTRIDGE MAESTRO DRILL (MISCELLANEOUS) ×3
PACK LAMINECTOMY NEURO (CUSTOM PROCEDURE TRAY) ×3 IMPLANT
PAD ARMBOARD 7.5X6 YLW CONV (MISCELLANEOUS) ×6 IMPLANT
PATTIES SURGICAL .5X1.5 (GAUZE/BANDAGES/DRESSINGS) ×3 IMPLANT
PLATE ARCHON 2-LEVEL 44MM (Plate) ×3 IMPLANT
PUTTY BONE ATTRAX 1CC CYLINDER (Putty) ×6 IMPLANT
RUBBERBAND STERILE (MISCELLANEOUS) ×6 IMPLANT
SCREW ARCHON ST FIX 4.0X15MM (Screw) ×6 IMPLANT
SCREW ARCHON ST VAR 4.0X15MM (Screw) ×8 IMPLANT
SCREW BN 15X4XST VA NS SPN (Screw) ×4 IMPLANT
SPONGE INTESTINAL PEANUT (DISPOSABLE) ×3 IMPLANT
SPONGE SURGIFOAM ABS GEL SZ50 (HEMOSTASIS) ×3 IMPLANT
STAPLER VISISTAT 35W (STAPLE) ×3 IMPLANT
STOCKINETTE 6  STRL (DRAPES) ×2
STOCKINETTE 6 STRL (DRAPES) ×1 IMPLANT
SUT STRATAFIX MNCRL+ 3-0 PS-2 (SUTURE) ×1
SUT STRATAFIX MONOCRYL 3-0 (SUTURE) ×2
SUT VIC AB 3-0 SH 8-18 (SUTURE) ×3 IMPLANT
SUTURE STRATFX MNCRL+ 3-0 PS-2 (SUTURE) ×1 IMPLANT
TOWEL OR 17X24 6PK STRL BLUE (TOWEL DISPOSABLE) ×6 IMPLANT
TOWEL OR 17X26 10 PK STRL BLUE (TOWEL DISPOSABLE) ×3 IMPLANT
TUBE CONNECTING 12'X1/4 (SUCTIONS)
TUBE CONNECTING 12X1/4 (SUCTIONS) IMPLANT
WATER STERILE IRR 1000ML POUR (IV SOLUTION) ×3 IMPLANT

## 2016-09-01 NOTE — Anesthesia Preprocedure Evaluation (Addendum)
Anesthesia Evaluation  Patient identified by MRN, date of birth, ID band Patient awake    Reviewed: Allergy & Precautions, NPO status , Patient's Chart, lab work & pertinent test results  Airway Mallampati: III  TM Distance: >3 FB Neck ROM: Limited    Dental  (+) Teeth Intact, Dental Advisory Given   Pulmonary neg pulmonary ROS,    Pulmonary exam normal breath sounds clear to auscultation       Cardiovascular Exercise Tolerance: Good negative cardio ROS Normal cardiovascular exam Rhythm:Regular Rate:Normal     Neuro/Psych PSYCHIATRIC DISORDERS Anxiety Cervical stenosis L>R     GI/Hepatic negative GI ROS, Neg liver ROS,   Endo/Other  diabetes (diet controlled), Well Controlled, Type 2  Renal/GU negative Renal ROS     Musculoskeletal  (+) Arthritis , Osteoarthritis,    Abdominal   Peds  Hematology negative hematology ROS (+)   Anesthesia Other Findings Day of surgery medications reviewed with the patient.  Reproductive/Obstetrics                            Anesthesia Physical Anesthesia Plan  ASA: II  Anesthesia Plan: General   Post-op Pain Management:    Induction: Intravenous  Airway Management Planned: Oral ETT and Video Laryngoscope Planned  Additional Equipment:   Intra-op Plan:   Post-operative Plan: Extubation in OR  Informed Consent: I have reviewed the patients History and Physical, chart, labs and discussed the procedure including the risks, benefits and alternatives for the proposed anesthesia with the patient or authorized representative who has indicated his/her understanding and acceptance.   Dental advisory given  Plan Discussed with: CRNA  Anesthesia Plan Comments: (Risks/benefits of general anesthesia discussed with patient including risk of damage to teeth, lips, gum, and tongue, nausea/vomiting, allergic reactions to medications, and the possibility of  heart attack, stroke and death.  All patient questions answered.  Patient wishes to proceed.)       Anesthesia Quick Evaluation

## 2016-09-01 NOTE — Op Note (Signed)
09/01/2016  2:27 PM  PATIENT:  Richard Noble  50 y.o. male  PRE-OPERATIVE DIAGNOSIS:  Cervical spondylosis with radiculopathy  POST-OPERATIVE DIAGNOSIS:  Same  PROCEDURE:  C5-7 anterior discectomy with fusion and plate fixation  SURGEON:  Aldean Ast, MD  ASSISTANTS: Earnie Larsson, MD  ANESTHESIA:   General  DRAINS: Medium hemovac   SPECIMEN:  None  INDICATION FOR PROCEDURE: 50 year old male with cervical radiculopathy and two level foraminal stenosis.  Patient understood the risks, benefits, and alternatives and potential outcomes and wished to proceed.  PROCEDURE DETAILS: Patient was brought to the operating room placed under general endotracheal anesthesia. Patient was placed in the supine position on the operating room table. The neck was prepped with betadine and chloraprep and draped in a sterile fashion.   A transverse incision was made on the right side of the neck. Dissection was carried down thru the subcutaneous tissue and the platysma was elevated, opened vertically, and undermined with Metzenbaum scissors. Dissection was then carried out thru an avascular plane leaving the sternocleidomastoid, carotid artery, and jugular vein laterally and the trachea and esophagus medially. The ventral aspect of the vertebral column was identified and a localizing x-ray was taken. The C5-6 level was identified. The longus colli muscles were then elevated and the retractor was placed. The annulus was incised and the disc space entered. Discectomy was performed with micro-curettes and pituitary and kerrison rongeurs. I then used the high-speed drill to drill the endplates down to the level of the posterior longitudinal ligament. The operating microscope was draped and brought into the field provided additional magnification, illumination and visualization. Utilizing microsurgical technique, discectomy was continued posteriorly thru the disc space. Posterior longitudinal ligament was  opened with a nerve hook, and then removed along with disc herniation and osteophytes, decompressing the spinal canal and thecal sac. We then continued to remove osteophytic overgrowth and disc material decompressing the neural foramina and exiting nerve roots bilaterally. So by both visualization and palpation we felt we had an adequate decompression of the neural elements. We then measured the height of the intravertebral disc space and selected a Peek interbody cage packed with beta tricalcium phosphate. It was then gently positioned in the intravertebral disc space and countersunk.   The superior distraction pin was then removed and bone bleeding was stopped with bone wax. The distraction pin was inserted at the inferior level. The annulus was incised and the disc space entered. Discectomy was performed with micro-curettes and pituitary and kerrison rongeurs. I then used the high-speed drill to drill the endplates down to the level of the posterior longitudinal ligament. The operating microscope was returned to the field.  The discectomy was continued posteriorly thru the disc space. Posterior longitudinal ligament was opened with a nerve hook, and then removed along with disc herniation and osteophytes, decompressing the spinal canal and thecal sac. We then continued to remove osteophytic overgrowth and disc material decompressing the neural foramina and exiting nerve roots bilaterally. So by both visualization and palpation we felt we had an adequate decompression of the neural elements. We then measured the height of the intravertebral disc space and selected a second Peek interbody cage which was packed with beta tricalcium phosphate. It was then gently positioned in the intravertebral disc space and countersunk.   I then inserted a titanium plate and placed four variable angle screws into the two superior vertebral bodies and two fixed angle screws at the inferior level and locked them into position.  The wound was irrigated with bacitracin solution, checked for hemostasis which was established and confirmed. Once meticulous hemostasis was achieved a medium hemovac drain was placed. The platysma was closed with interrupted 3-0 undyed Vicryl suture, the subcuticular layer was closed with running undyed Vicryl suture. The skin edges were approximated with dermabond. The drapes were removed. A sterile dressing was applied. The patient was then awakened from general anesthesia and transferred to the recovery room in stable condition. At the end of the procedure all sponge, needle and instrument counts were correct.   PATIENT DISPOSITION:  PACU - hemodynamically stable.   Delay start of Pharmacological VTE agent (>24hrs) due to surgical blood loss or risk of bleeding:  yes

## 2016-09-01 NOTE — Anesthesia Procedure Notes (Signed)
Procedure Name: Intubation Date/Time: 09/01/2016 11:51 AM Performed by: Merrilyn Puma B Pre-anesthesia Checklist: Patient identified, Emergency Drugs available, Suction available, Patient being monitored and Timeout performed Patient Re-evaluated:Patient Re-evaluated prior to inductionOxygen Delivery Method: Circle system utilized Preoxygenation: Pre-oxygenation with 100% oxygen Intubation Type: IV induction Ventilation: Mask ventilation without difficulty Grade View: Grade I Tube type: Oral Tube size: 7.5 mm Number of attempts: 1 Airway Equipment and Method: Stylet and Video-laryngoscopy Placement Confirmation: ETT inserted through vocal cords under direct vision,  positive ETCO2,  CO2 detector and breath sounds checked- equal and bilateral Secured at: 22 cm Tube secured with: Tape Dental Injury: Teeth and Oropharynx as per pre-operative assessment  Comments: Head and neck maintained neutral position during intubation with glidescope.

## 2016-09-01 NOTE — Progress Notes (Signed)
PHARMACIST - PHYSICIAN ORDER COMMUNICATION  CONCERNING: P&T Medication Policy on Herbal Medications  DESCRIPTION:  This patient's order for:  tumeric  has been noted.  This product(s) is classified as an "herbal" or natural product. Due to a lack of definitive safety studies or FDA approval, nonstandard manufacturing practices, plus the potential risk of unknown drug-drug interactions while on inpatient medications, the Pharmacy and Therapeutics Committee does not permit the use of "herbal" or natural products of this type within [facility].   ACTION TAKEN: The pharmacy department is unable to verify this order at this time. Please reevaluate patient's clinical condition at discharge and address if the herbal or natural product(s) should be resumed at that time.

## 2016-09-01 NOTE — Transfer of Care (Signed)
Immediate Anesthesia Transfer of Care Note  Patient: Richard Noble  Procedure(s) Performed: Procedure(s): Cervical five-six, Cervical six-seven ANTERIOR CERVICAL DECOMPRESSION/DISCECTOMY FUSION (N/A)  Patient Location: PACU  Anesthesia Type:General  Level of Consciousness: awake, alert  and oriented  Airway & Oxygen Therapy: Patient Spontanous Breathing and Patient connected to nasal cannula oxygen  Post-op Assessment: Report given to RN, Post -op Vital signs reviewed and stable and Patient moving all extremities X 4  Post vital signs: Reviewed and stable  Last Vitals:  Vitals:   09/01/16 0856  BP: 130/76  Pulse: 66  Resp: 20  Temp: 36.5 C    Last Pain:  Vitals:   09/01/16 0856  TempSrc: Oral         Complications: No apparent anesthesia complications

## 2016-09-01 NOTE — H&P (Signed)
CC:  No chief complaint on file.   HPI: Richard Noble is a 50 y.o. male with cervical radiculopathy.  He presents for elective C5-6 and C6-7 anterior cervical discectomy and fusion.  No changes since clinic.  PMH: Past Medical History:  Diagnosis Date  . Anxiety    uses valium for anxiety related to pain.   . Arthritis    stenosis, cervical area, arthritis  - spine   . Diabetes mellitus without complication ([place])    treated with diet only  . History of exercise stress test     in [place] 20 yrs. ago had stress test & he was followed by cardiologist for a couple yrs. , but hasn't been referred since he has lived here.   Marland Kitchen History of kidney stones    found incidentally - no problems, just stable   . Sleep concern    study done in [place], told that it was normal, sleep issue related to pain.      PSH: Past Surgical History:  Procedure Laterality Date  . APPENDECTOMY  1984  . BACK SURGERY  2012    L5-S1 fusion  . BACK SURGERY  2016   C6-7 Decompression. [facility])    [place]: Social History  Substance Use Topics  . Smoking status: Never Smoker  . Smokeless tobacco: Never Used  . Alcohol use 0.0 oz/week     Comment: occas.    MEDS: Prior to Admission medications   Medication Sig Start Date End Date Taking? Authorizing Provider  diazepam (VALIUM) 2 MG tablet Take 2 mg by mouth every 6 (six) hours as needed for anxiety.   Yes Historical Provider, MD  diclofenac (FLECTOR) 1.3 % PTCH Place 1 patch onto the skin 2 (two) times daily.   Yes Historical Provider, MD  traMADol (ULTRAM) 50 MG tablet Take by mouth 3 (three) times daily.   Yes Historical Provider, MD  TURMERIC PO Take by mouth.   Yes Historical Provider, MD  Turmeric POWD by Does not apply route 2 (two) times daily. MIXES INTO A PASTE AND USES 1/4 OF TEASPOON TWICE DAILY   Yes Historical Provider, MD  zolpidem (AMBIEN) 10 MG tablet Take 10 mg by mouth at bedtime.    Yes Historical Provider, MD     ALLERGY: Allergies  Allergen Reactions  . Trazodone And Nefazodone Other (See Comments)    Passes out    ROS: ROS  NEUROLOGIC EXAM: Awake, alert, oriented Full strength throughout  IMAGING: No new imaging  IMPRESSION: - 50 y.o. male with cervical spondylosis with radiculopathy.  Neuro intact.  PLAN: - C5-6, 6-7 ACDF - We have had a long discussion about the risks and benefits of the operation as well as the alternatives and he wishes to proceed.

## 2016-09-01 NOTE — Anesthesia Postprocedure Evaluation (Signed)
Anesthesia Post Note  Patient: Richard Noble  Procedure(s) Performed: Procedure(s) (LRB): Cervical five-six, Cervical six-seven ANTERIOR CERVICAL DECOMPRESSION/DISCECTOMY FUSION (N/A)  Patient location during evaluation: PACU Anesthesia Type: General Level of consciousness: awake and alert Pain management: pain level controlled Vital Signs Assessment: post-procedure vital signs reviewed and stable Respiratory status: spontaneous breathing, nonlabored ventilation, respiratory function stable and patient connected to nasal cannula oxygen Cardiovascular status: blood pressure returned to baseline and stable Postop Assessment: no signs of nausea or vomiting Anesthetic complications: no    Last Vitals:  Vitals:   09/01/16 1630 09/01/16 1659  BP:  124/75  Pulse: 82 77  Resp: 16 16  Temp: 36.1 C     Last Pain:  Vitals:   09/01/16 1600  TempSrc:   PainSc: [place]

## 2016-09-02 ENCOUNTER — Encounter (HOSPITAL_COMMUNITY): Payer: Self-pay | Admitting: Neurological Surgery

## 2016-09-02 DIAGNOSIS — M4722 Other spondylosis with radiculopathy, cervical region: Secondary | ICD-10-CM | POA: Diagnosis not present

## 2016-09-02 LAB — GLUCOSE, CAPILLARY: Glucose-Capillary: 139 mg/dL — ABNORMAL HIGH (ref 65–99)

## 2016-09-02 MED ORDER — GABAPENTIN 300 MG PO CAPS: 300.0000 mg | ORAL_CAPSULE | Freq: Three times a day (TID) | ORAL | 2 refills | Status: DC

## 2016-09-02 MED ORDER — HYDROCODONE-ACETAMINOPHEN 7.5-325 MG PO TABS: 1.0000 | ORAL_TABLET | ORAL | 0 refills | Status: DC | PRN

## 2016-09-02 MED ORDER — METHOCARBAMOL 750 MG PO TABS: 750.0000 mg | ORAL_TABLET | Freq: Four times a day (QID) | ORAL | 2 refills | Status: DC

## 2016-09-02 MED FILL — Thrombin For Soln 5000 Unit: CUTANEOUS | Qty: 5000 | Status: AC

## 2016-09-02 NOTE — Discharge Summary (Signed)
Date of Admission: 09/01/2016  Date of Discharge: 09/02/16  PRE-OPERATIVE DIAGNOSIS:  Cervical spondylosis with radiculopathy  POST-OPERATIVE DIAGNOSIS:  Same  PROCEDURE:  C5-7 anterior discectomy with fusion and plate fixation  Attending: Tamala Fothergill, MD  Hospital Course:  The patient was admitted for the above listed operation and had an uncomplicated post-operative course.  They were discharged in stable condition.  Follow up: 3 weeks    Medication List    TAKE these medications   diazepam 2 MG tablet Commonly known as:  VALIUM Take 2 mg by mouth every 6 (six) hours as needed for anxiety.   diclofenac 1.3 % Ptch Commonly known as:  FLECTOR Place 1 patch onto the skin 2 (two) times daily.   gabapentin 300 MG capsule Commonly known as:  NEURONTIN Take 1 capsule (300 mg total) by mouth 3 (three) times daily.   HYDROcodone-acetaminophen 7.5-325 MG tablet Commonly known as:  NORCO Take 1-2 tablets by mouth every 4 (four) hours as needed for moderate pain.   methocarbamol 750 MG tablet Commonly known as:  ROBAXIN Take 1 tablet (750 mg total) by mouth 4 (four) times daily.   traMADol 50 MG tablet Commonly known as:  ULTRAM Take by mouth 3 (three) times daily.   TURMERIC PO Take by mouth.   Turmeric Powd by Does not apply route 2 (two) times daily. MIXES INTO A PASTE AND USES 1/4 OF TEASPOON TWICE DAILY   zolpidem 10 MG tablet Commonly known as:  AMBIEN Take 10 mg by mouth at bedtime.

## 2016-09-02 NOTE — Progress Notes (Signed)
Pt doing well. Pt and wife given D/C instructions with Rx's, verbal understanding was provided. Pt's incision is clean and dry with no sign of infection. Pt's IV and Hemovac were removed prior to D/C. Pt D/C'd home via walking 1015 per MD order. Pt is stable @ D/C and has no other needs at this time. Holli Humbles, RN

## 2016-10-08 IMAGING — MR MR CERVICAL SPINE W/O CM
4 of 5 series · 27 of 48 positions shown · non-contrast
Comparison: Cervical spine radiographs 08/07/2015

CLINICAL DATA: Severe neck pain extending at the the upper back and
shoulders. Pain runs down the arms and forearms. Mild weakness and
numbness.

EXAM:
MRI CERVICAL SPINE WITHOUT CONTRAST
TECHNIQUE: Multiplanar, multisequence MR imaging of the cervical spine was
performed. No intravenous contrast was administered.

[Series 4: T1 · sagittal · 3.0mm · 0.41mm/px · 7 of 13 slices shown]
[im 1/13]
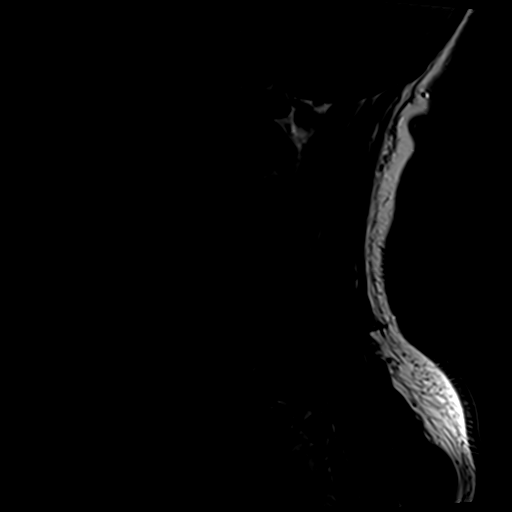
[im 3/13]
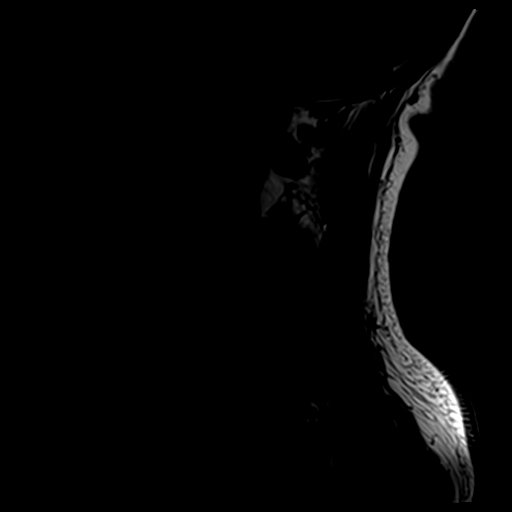
[im 5/13]
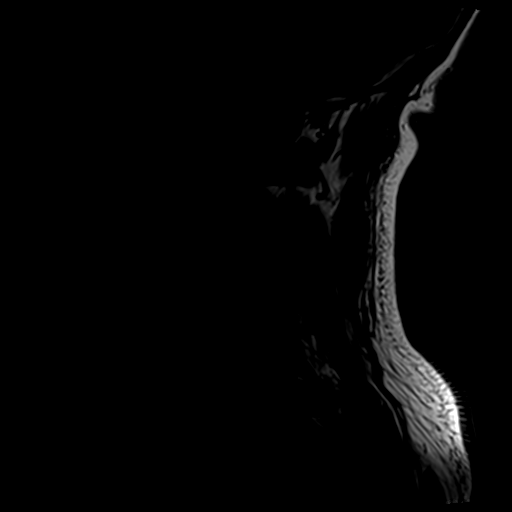
[im 7/13]
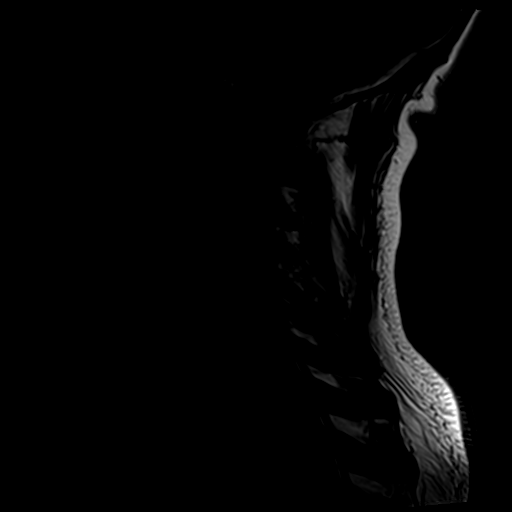
[im 9/13]
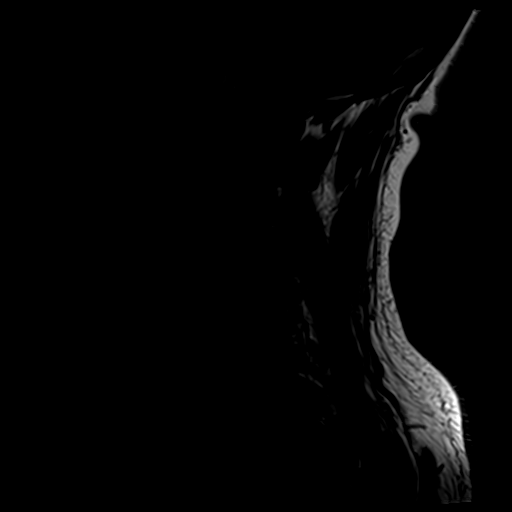
[im 11/13]
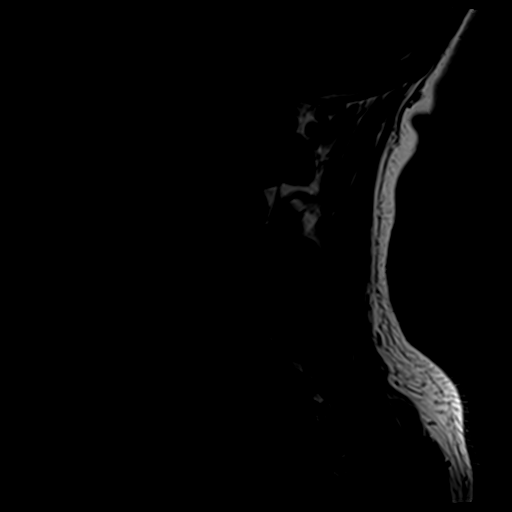
[im 13/13]
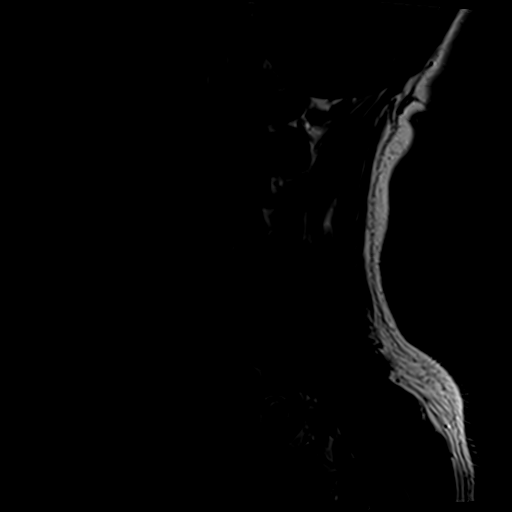

[Series 5: tir sag · sagittal · 3.0mm · 0.41mm/px · 6 of 13 slices shown]
[im 1/13]
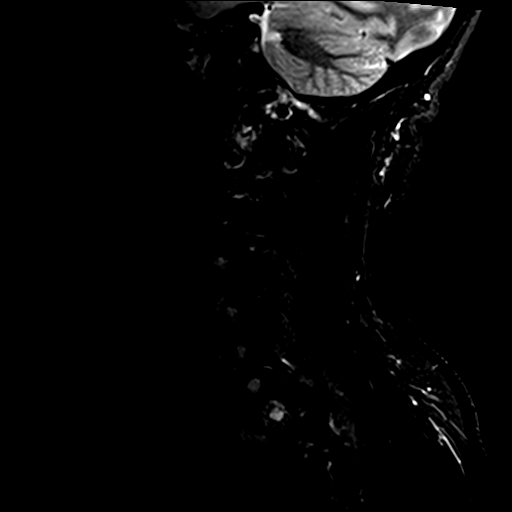
[im 3/13]
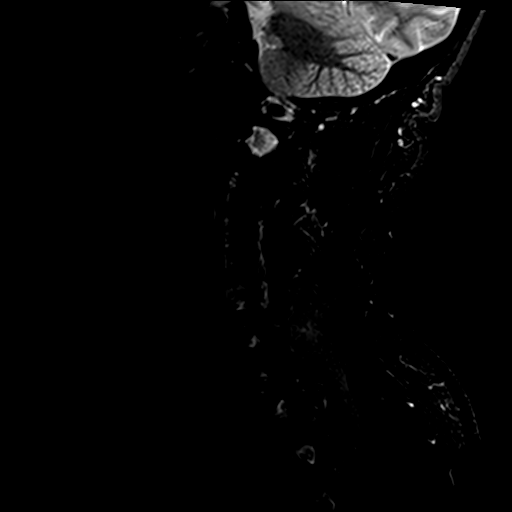
[im 5/13]
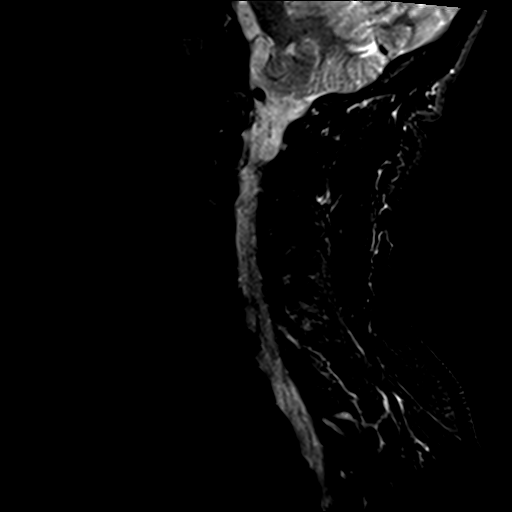
[im 8/13]
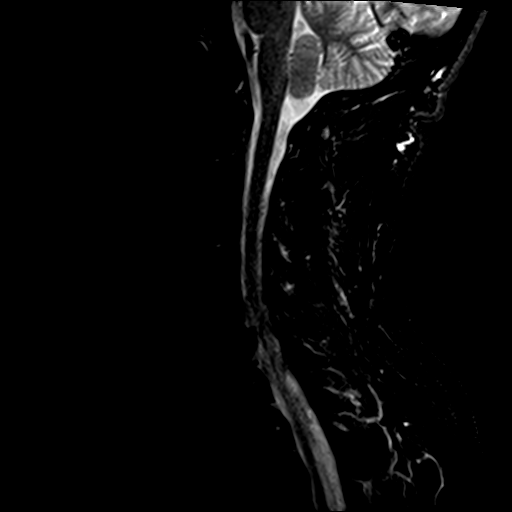
[im 10/13]
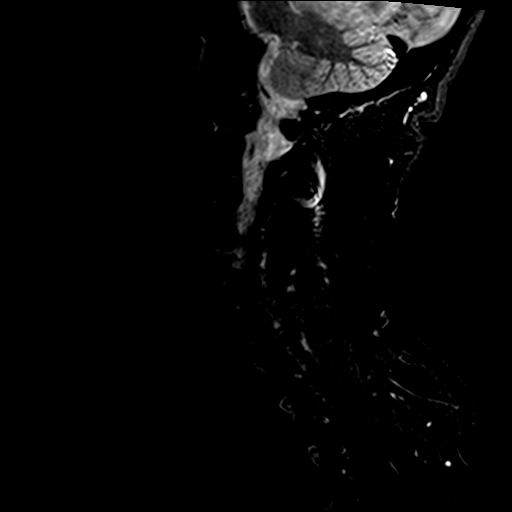
[im 13/13]
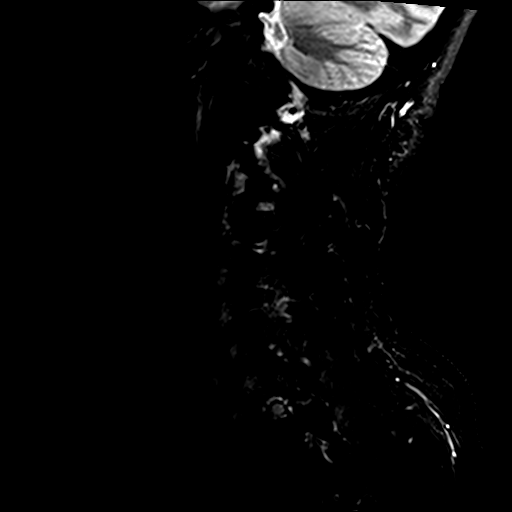

[Series 6: T2 · sagittal · 3.0mm · 0.66mm/px · 6 of 13 slices shown (1 of 2)]
[im 1/13]
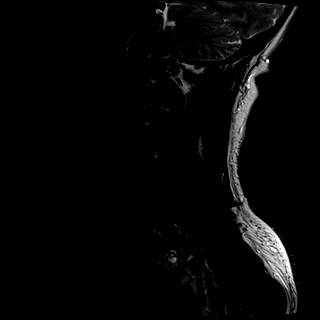
[im 3/13]
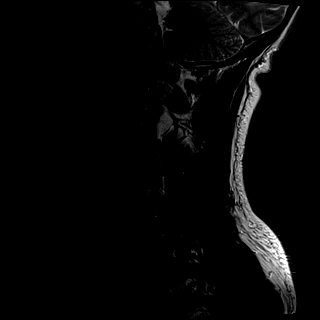
[im 5/13]
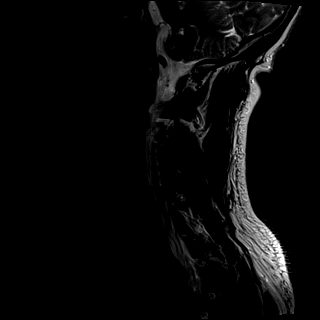
[im 8/13]
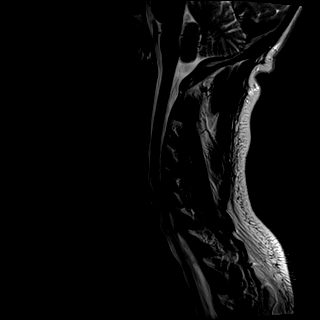
[im 10/13]
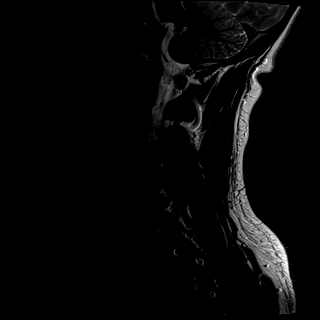
[im 13/13]
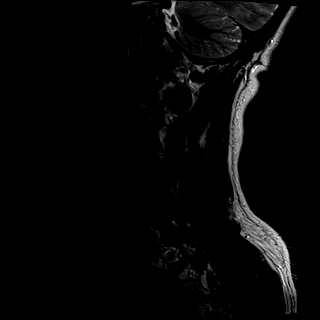

[Series 8: T2 · axial · 3.0mm · 0.70mm/px · z∈[-85,+31]mm · 8 of 28 slices shown (2 of 2)]
[im 1/28]
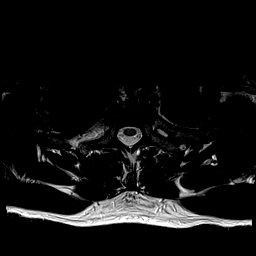
[im 5/28]
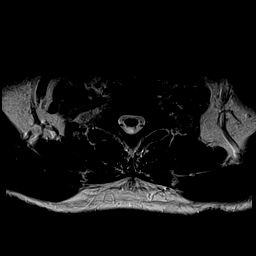
[im 9/28]
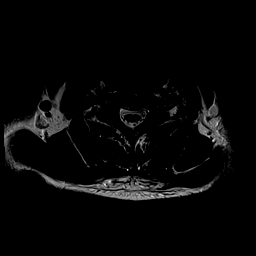
[im 13/28]
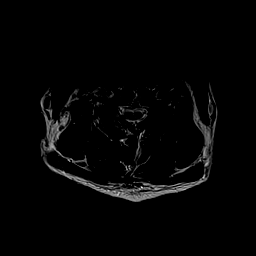
[im 15/28]
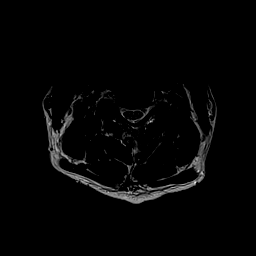
[im 19/28]
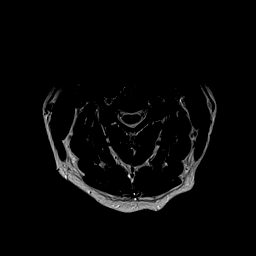
[im 23/28]
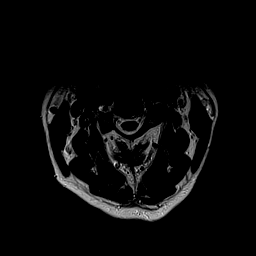
[im 28/28]
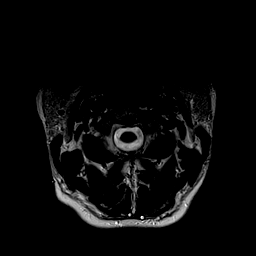

[27 of 48 positions shown; findings below may reference images not displayed]

FINDINGS: Normal signal is present in cervical and upper thoracic spinal cord
to the lowest imaged level, T2-3. Marrow signal vertebral body
heights, alignment are normal. The craniocervical junction is within
normal limits. Visualized intracranial contents are.

Flow is present in the vertebral arteries bilaterally.

C2-3:  Negative.

C3-4: Asymmetric left-sided facet hypertrophy is present.
Uncovertebral spurring is worse on left. This results in mild left
foraminal narrowing. There is a shallow central protrusion without
significant central stenosis.

C4-5: Shallow central disc protrusion partially effaces ventral CSF.
Mild foraminal narrowing is evident bilaterally due to uncovertebral
disease.

C5-6: A broad-based disc osteophyte complex partially effaces the
ventral CSF. Mild foraminal narrowing is present on the right due to
uncovertebral and facet hypertrophy.

C6-7: A broad-based disc osteophyte complex partially effaces the
ventral CSF. Mild to moderate foraminal narrowing is worse on the
right.

C7-T1: Mild foraminal narrowing bilaterally is due to uncovertebral
disease. The central canal is patent.
IMPRESSION: 1. Mild left foraminal narrowing at C3-4 due to uncovertebral and
facet disease.
2. Mild foraminal narrowing bilaterally at C4-5
3. Mild right foraminal narrowing at C5-6 due to uncovertebral and
facet disease.
4. Mild to moderate foraminal narrowing at C6-7 is worse on the
right.
5. Mild to moderate central canal stenosis at C6-7.
6. Mild foraminal narrowing bilaterally at C7-T1 without significant
central stenosis.

## 2016-10-30 ENCOUNTER — Ambulatory Visit: Payer: Medicare Other | Admitting: Adult Health

## 2016-11-05 ENCOUNTER — Encounter: Payer: Self-pay | Admitting: Family Medicine

## 2016-11-05 ENCOUNTER — Ambulatory Visit (INDEPENDENT_AMBULATORY_CARE_PROVIDER_SITE_OTHER): Payer: Medicare Other | Admitting: Family Medicine

## 2016-11-05 VITALS — BP 85/47 | HR 74 | Ht 65.0 in | Wt 146.8 lb

## 2016-11-05 DIAGNOSIS — R7303 Prediabetes: Secondary | ICD-10-CM

## 2016-11-05 DIAGNOSIS — Z9889 Other specified postprocedural states: Secondary | ICD-10-CM | POA: Diagnosis not present

## 2016-11-05 DIAGNOSIS — G4700 Insomnia, unspecified: Secondary | ICD-10-CM | POA: Diagnosis not present

## 2016-11-05 DIAGNOSIS — Z981 Arthrodesis status: Secondary | ICD-10-CM

## 2016-11-05 DIAGNOSIS — E1169 Type 2 diabetes mellitus with other specified complication: Secondary | ICD-10-CM | POA: Insufficient documentation

## 2016-11-05 DIAGNOSIS — G894 Chronic pain syndrome: Secondary | ICD-10-CM

## 2016-11-05 DIAGNOSIS — Z79899 Other long term (current) drug therapy: Secondary | ICD-10-CM

## 2016-11-05 DIAGNOSIS — E119 Type 2 diabetes mellitus without complications: Secondary | ICD-10-CM | POA: Insufficient documentation

## 2016-11-05 DIAGNOSIS — M62838 Other muscle spasm: Secondary | ICD-10-CM

## 2016-11-05 DIAGNOSIS — Z560 Unemployment, unspecified: Secondary | ICD-10-CM

## 2016-11-05 DIAGNOSIS — E786 Lipoprotein deficiency: Secondary | ICD-10-CM

## 2016-11-05 MED ORDER — ZOLPIDEM TARTRATE 10 MG PO TABS: 10.0000 mg | ORAL_TABLET | Freq: Every evening | ORAL | 1 refills | Status: DC | PRN
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
## 2016-11-05 NOTE — Assessment & Plan Note (Addendum)
History of C6-7 decompression July 2016  &  C5-6-7: Cervical spinal Fusion  Oct 2017

## 2016-11-05 NOTE — Assessment & Plan Note (Signed)
>>  ASSESSMENT AND PLAN FOR STATUS POST LUMBAR SPINAL FUSION WRITTEN ON 11/05/2016  9:39 AM BY OPALSKI, DEBORAH, DO  L5-S1:  2012

## 2016-11-05 NOTE — Patient Instructions (Addendum)
Mention to patient that he might want to talk to his pain doctor about elavil or Pamelor to help him sleep and well as well aswith his pain t night that he experiences also he might want to look into usingNeuronin.   Ideally, Ambie would jst beashort-rm treatmnt foinsomnia and we wouldse other mes for chronic management.  -- discussed with patient that soul come in for full physiali abot 4-6 months. He will comein pir for fll fsting lab work.  Please realize, EXERCISE IS MEDICINE!  -  American Heart Association [facility]) guidelines for exercise : If you are in good health, without any medical conditions, you should engage in 150 minutes of moderate intensity aerobic activity per week.  This means you should be huffing and puffing throughout your workout.   Engaging in regular exercise will improve brain function and memory, as well as improve mood, boost immune system and help with weight management.  As well as the other, more well-known effects of exercise such as decreasing blood sugar levels, decreasing blood pressure,  and decreasing bad cholesterol levels/ increasing good cholesterol levels.     -  The AHA strongly endorses consumption of a diet that contains a variety of foods from all the food categories with an emphasis on fruits and vegetables; fat-free and low-fat dairy products; cereal and grain products; legumes and nuts; and fish, poultry, and/or extra lean meats.    Excessive food intake, especially of foods high in saturated and trans fats, sugar, and salt, should be avoided.    Adequate water intake of roughly 1/2 of your weight in pounds, should equal the ounces of water per day you should drink.  So for instance, if you're 200 pounds, that would be 100 ounces of water per day.     Top Ten Foods for Health  1. Water Drink at least 8 to 12 cups of water daily. Consume half of your body weight in pounds, is the amount of water in ounces to drink daily.  Ie: a 200lb person = 100 oz  water daily  2. Dark Green Vegetables Eat dark green vegetables at least three to four times a week. Good options include broccoli, peppers, brussel sprouts and leafy greens like kale and spinach.  3. Whole Grains Whole grains should be included in your diet at least two to three times daily. Look for whole wheat flour, rye, oatmeal, barley, amaranth, quinoa or a multigrain. A good source of fiber includes 3 to 4 grams of fiber per serving. A great source has 5 or more grams of fiber per serving.  4. Beans and Lentils Try to eat a bean-based meal at least once a week. Try to add legumes, including beans and lentils, to soups, stews, casseroles, salads and dips or eat them plain.  5. Fish Try to eat two to three serving of fish a week. A serving consists of 3 to 4 ounces of cooked fish. Good choices are salmon, trout, herring, bluefish, sardines and tuna.  6. Berries Include two to four servings of fruit in your diet each day. Try to eat berries such as raspberries, blueberries, blackberries and strawberries.  7. Winter Squash Eat butternut and acorn squash as well as other richly pigmented dark orange and green colored vegetables like sweet potato, cantaloupe and mango.  8. Soy 25 grams of soy protein a day is recommended as part of a low-fat diet to help lower cholesterol levels. Try tofu, soymilk, edamame soybeans, tempeh and texturized vegetable protein (  TVP).  9. Flaxseed, Nuts and Seeds Add 1 to 2 tablespoons of ground flaxseed or other seeds to food each day or include a moderate amount of nuts - 1/4 cup - in your daily diet.  10. Organic Yogurt Men and women between 98 and 92 years of age need 1000 milligrams of calcium a day and 1200 milligrams if 10 or older. Eat calcium-rich foods such as nonfat or low-fat dairy products three to four times a day. Include organic choices.        Mediterranean Diet  Why follow it? Research shows. . Those who follow the Mediterranean diet  have a reduced risk of heart disease  . The diet is associated with a reduced incidence of Parkinson's and Alzheimer's diseases . People following the diet may have longer life expectancies and lower rates of chronic diseases  . The Dietary Guidelines for Americans recommends the Mediterranean diet as an eating plan to promote health and prevent disease  What Is the Mediterranean Diet?  . Healthy eating plan based on typical foods and recipes of Mediterranean-style cooking . The diet is primarily a plant based diet; these foods should make up a majority of meals   Starches - Plant based foods should make up a majority of meals - They are an important sources of vitamins, minerals, energy, antioxidants, and fiber - Choose whole grains, foods high in fiber and minimally processed items  - Typical grain sources include wheat, oats, barley, corn, brown rice, bulgar, farro, millet, polenta, couscous  - Various types of beans include chickpeas, lentils, fava beans, black beans, white beans   Fruits  Veggies - Large quantities of antioxidant rich fruits & veggies; 6 or more servings  - Vegetables can be eaten raw or lightly drizzled with oil and cooked  - Vegetables common to the traditional Mediterranean Diet include: artichokes, arugula, beets, broccoli, brussel sprouts, cabbage, carrots, celery, collard greens, cucumbers, eggplant, kale, leeks, lemons, lettuce, mushrooms, okra, onions, peas, peppers, potatoes, pumpkin, radishes, rutabaga, shallots, spinach, sweet potatoes, turnips, zucchini - Fruits common to the Mediterranean Diet include: apples, apricots, avocados, cherries, clementines, dates, figs, grapefruits, grapes, melons, nectarines, oranges, peaches, pears, pomegranates, strawberries, tangerines  Fats - Replace butter and margarine with healthy oils, such as olive oil, canola oil, and tahini  - Limit nuts to no more than a handful a day  - Nuts include walnuts, almonds, pecans, pistachios,  pine nuts  - Limit or avoid candied, honey roasted or heavily salted nuts - Olives are central to the [employer] - can be eaten whole or used in a variety of dishes   Meats Protein - Limiting red meat: no more than a few times a month - When eating red meat: choose lean cuts and keep the portion to the size of deck of cards - Eggs: approx. 0 to 4 times a week  - Fish and lean poultry: at least 2 a week  - Healthy protein sources include, chicken, [place], lean beef, lamb - Increase intake of seafood such as tuna, salmon, trout, mackerel, shrimp, scallops - Avoid or limit high fat processed meats such as sausage and bacon  Dairy - Include moderate amounts of low fat dairy products  - Focus on healthy dairy such as fat free yogurt, skim milk, low or reduced fat cheese - Limit dairy products higher in fat such as whole or 2% milk, cheese, ice cream  Alcohol - Moderate amounts of red wine is ok  - No more than 5  oz daily for women (all ages) and men older than age 51  - No more than 10 oz of wine daily for men younger than 79  Other - Limit sweets and other desserts  - Use herbs and spices instead of salt to flavor foods  - Herbs and spices common to the traditional Mediterranean Diet include: basil, bay leaves, chives, cloves, cumin, fennel, garlic, lavender, marjoram, mint, oregano, parsley, pepper, rosemary, sage, savory, sumac, tarragon, thyme   It's not just a diet, it's a lifestyle:  . The Mediterranean diet includes lifestyle factors typical of those in the region  . Foods, drinks and meals are best eaten with others and savored . Daily physical activity is important for overall good health . This could be strenuous exercise like running and aerobics . This could also be more leisurely activities such as walking, housework, yard-work, or taking the stairs . Moderation is the key; a balanced and healthy diet accommodates most foods and drinks . Consider portion sizes and frequency  of consumption of certain foods   Meal Ideas & Options:  . Breakfast:  o Whole wheat toast or whole wheat English muffins with peanut butter & hard boiled egg o Steel cut oats topped with apples & cinnamon and skim milk  o Fresh fruit: banana, strawberries, melon, berries, peaches  o Smoothies: strawberries, bananas, greek yogurt, peanut butter o Low fat greek yogurt with blueberries and granola  o Egg white omelet with spinach and mushrooms o Breakfast couscous: whole wheat couscous, apricots, skim milk, cranberries  . Sandwiches:  o Hummus and grilled vegetables (peppers, zucchini, squash) on whole wheat bread   o Grilled chicken on whole wheat pita with lettuce, tomatoes, cucumbers or tzatziki  o Tuna salad on whole wheat bread: tuna salad made with greek yogurt, olives, red peppers, capers, green onions o Garlic rosemary lamb pita: lamb sauted with garlic, rosemary, salt & pepper; add lettuce, cucumber, greek yogurt to pita - flavor with lemon juice and black pepper  . Seafood:  o Mediterranean grilled salmon, seasoned with garlic, basil, parsley, lemon juice and black pepper o Shrimp, lemon, and spinach whole-grain pasta salad made with low fat greek yogurt  o Seared scallops with lemon orzo  o Seared tuna steaks seasoned salt, pepper, coriander topped with tomato mixture of olives, tomatoes, olive oil, minced garlic, parsley, green onions and cappers  . Meats:  o Herbed greek chicken salad with kalamata olives, cucumber, feta  o Red bell peppers stuffed with spinach, bulgur, lean ground beef (or lentils) & topped with feta   o Kebabs: skewers of chicken, tomatoes, onions, zucchini, squash  o [place] burgers: made with red onions, mint, dill, lemon juice, feta cheese topped with roasted red peppers . Vegetarian o Cucumber salad: cucumbers, artichoke hearts, celery, red onion, feta cheese, tossed in olive oil & lemon juice  o Hummus and whole grain pita points with a greek salad  (lettuce, tomato, feta, olives, cucumbers, red onion) o Lentil soup with celery, carrots made with vegetable broth, garlic, salt and pepper  o Tabouli salad: parsley, bulgur, mint, scallions, cucumbers, tomato, radishes, lemon juice, olive oil, salt and pepper.

## 2016-11-05 NOTE — Assessment & Plan Note (Signed)
We discussed increasing intensity o his aerbic exercise -best to do interval type training- making sure he reachs 80% max HR for optimal CV benefit

## 2016-11-05 NOTE — Assessment & Plan Note (Signed)
Patient will talk to his pain doc about trigger points, I told him that I'm happy to do those for him if he would like in the future.

## 2016-11-05 NOTE — Assessment & Plan Note (Signed)
Insomnia due to MSK pain/ muscle spasms.

## 2016-11-05 NOTE — Assessment & Plan Note (Signed)
>>  ASSESSMENT AND PLAN FOR STATUS POST CERVICAL SPINAL FUSION WRITTEN ON 11/05/2016  9:40 AM BY Thomasene LotPALSKI, DEBORAH, DO  History of C6-7 decompression July 2016  &  C5-6-7: Cervical spinal Fusion  Oct 2017

## 2016-11-05 NOTE — Assessment & Plan Note (Signed)
Patient will continue to get his tramadol and Valium from his pain doctor.  - We will treat his insomnia with Ambien.  Patient understands risks and benefits of these medicines.  - I recommended against benzodiazepines and these types of medicines as much as possible due to long-term effects.  - Patient will look into trigger points and dry needling in the future for his muscle spasms.

## 2016-11-05 NOTE — Assessment & Plan Note (Signed)
L5-S1:  2012

## 2016-11-05 NOTE — Assessment & Plan Note (Signed)
Patient states emotionally he is doing well.

## 2016-11-05 NOTE — Assessment & Plan Note (Signed)
Since 2011, secondary to [place] in 2009 with upper and lower back issues

## 2016-11-13 ENCOUNTER — Other Ambulatory Visit: Payer: Medicare Other

## 2016-11-14 ENCOUNTER — Other Ambulatory Visit: Payer: Medicare Other

## 2016-11-27 DIAGNOSIS — M4727 Other spondylosis with radiculopathy, lumbosacral region: Secondary | ICD-10-CM | POA: Insufficient documentation

## 2016-12-30 ENCOUNTER — Other Ambulatory Visit: Payer: Self-pay | Admitting: Orthopedic Surgery

## 2016-12-30 DIAGNOSIS — R52 Pain, unspecified: Secondary | ICD-10-CM

## 2016-12-30 DIAGNOSIS — R531 Weakness: Secondary | ICD-10-CM

## 2017-01-02 ENCOUNTER — Ambulatory Visit
Admission: RE | Admit: 2017-01-02 | Discharge: 2017-01-02 | Disposition: A | Discharge status: Home / Self Care | Payer: Medicare Other | Source: Ambulatory Visit | Attending: Orthopedic Surgery | Admitting: Orthopedic Surgery

## 2017-01-02 DIAGNOSIS — R531 Weakness: Secondary | ICD-10-CM

## 2017-01-02 DIAGNOSIS — R52 Pain, unspecified: Secondary | ICD-10-CM

## 2017-01-05 ENCOUNTER — Other Ambulatory Visit: Payer: Medicare Other

## 2017-01-13 ENCOUNTER — Ambulatory Visit: Payer: Medicare Other | Admitting: Family Medicine

## 2017-02-04 ENCOUNTER — Other Ambulatory Visit (INDEPENDENT_AMBULATORY_CARE_PROVIDER_SITE_OTHER): Payer: Medicare Other

## 2017-02-04 ENCOUNTER — Other Ambulatory Visit: Payer: Self-pay

## 2017-02-04 DIAGNOSIS — Z79899 Other long term (current) drug therapy: Secondary | ICD-10-CM

## 2017-02-04 DIAGNOSIS — R5383 Other fatigue: Secondary | ICD-10-CM

## 2017-02-04 DIAGNOSIS — E786 Lipoprotein deficiency: Secondary | ICD-10-CM

## 2017-02-04 DIAGNOSIS — R7303 Prediabetes: Secondary | ICD-10-CM

## 2017-02-05 LAB — TSH: TSH: 2.96 u[IU]/mL (ref 0.450–4.500)

## 2017-02-05 LAB — COMPREHENSIVE METABOLIC PANEL
ALT: 32 IU/L (ref 0–44)
AST: 25 IU/L (ref 0–40)
Albumin/Globulin Ratio: 2.4 — ABNORMAL HIGH (ref 1.2–2.2)
Albumin: 4.7 g/dL (ref 3.5–5.5)
Alkaline Phosphatase: 44 IU/L (ref 39–117)
BUN/Creatinine Ratio: 20 (ref 9–20)
BUN: 17 mg/dL (ref 6–24)
Bilirubin Total: 0.7 mg/dL (ref 0.0–1.2)
CO2: 27 mmol/L (ref 18–29)
Calcium: 9.5 mg/dL (ref 8.7–10.2)
Chloride: 100 mmol/L (ref 96–106)
Creatinine, Ser: 0.86 mg/dL (ref 0.76–1.27)
GFR calc Af Amer: 117 mL/min/{1.73_m2} (ref >59)
GFR calc non Af Amer: 101 mL/min/{1.73_m2} (ref >59)
Globulin, Total: 2 g/dL (ref 1.5–4.5)
Glucose: 112 mg/dL — ABNORMAL HIGH (ref 65–99)
Potassium: 4.5 mmol/L (ref 3.5–5.2)
Sodium: 140 mmol/L (ref 134–144)
Total Protein: 6.7 g/dL (ref 6.0–8.5)

## 2017-02-05 LAB — HEMOGLOBIN A1C
Est. average glucose Bld gHb Est-mCnc: 131 mg/dL
Hgb A1c MFr Bld: 6.2 % — ABNORMAL HIGH (ref 4.8–5.6)

## 2017-02-05 LAB — LIPID PANEL
Chol/HDL Ratio: 4.8 ratio (ref 0.0–5.0)
Cholesterol, Total: 157 mg/dL (ref 100–199)
HDL: 33 mg/dL — ABNORMAL LOW (ref >39)
LDL Calculated: 102 mg/dL — ABNORMAL HIGH (ref 0–99)
Triglycerides: 111 mg/dL (ref 0–149)
VLDL Cholesterol Cal: 22 mg/dL (ref 5–40)

## 2017-02-05 LAB — CBC WITH DIFFERENTIAL/PLATELET
Basophils Absolute: 0 10*3/uL (ref 0.0–0.2)
Basos: 0 %
EOS (ABSOLUTE): 0.2 10*3/uL (ref 0.0–0.4)
Eos: 4 %
Hematocrit: 38.8 % (ref 37.5–51.0)
Hemoglobin: 13.3 g/dL (ref 13.0–17.7)
Immature Grans (Abs): 0 10*3/uL (ref 0.0–0.1)
Immature Granulocytes: 0 %
Lymphocytes Absolute: 2 10*3/uL (ref 0.7–3.1)
Lymphs: 38 %
MCH: 28.2 pg (ref 26.6–33.0)
MCHC: 34.3 g/dL (ref 31.5–35.7)
MCV: 82 fL (ref 79–97)
Monocytes Absolute: 0.5 10*3/uL (ref 0.1–0.9)
Monocytes: 9 %
Neutrophils Absolute: 2.5 10*3/uL (ref 1.4–7.0)
Neutrophils: 49 %
Platelets: 256 10*3/uL (ref 150–379)
RBC: 4.72 x10E6/uL (ref 4.14–5.80)
RDW: 13.4 % (ref 12.3–15.4)
WBC: 5.2 10*3/uL (ref 3.4–10.8)

## 2017-02-05 LAB — VITAMIN D 25 HYDROXY (VIT D DEFICIENCY, FRACTURES): Vit D, 25-Hydroxy: 42.8 ng/mL (ref 30.0–100.0)

## 2017-02-09 ENCOUNTER — Telehealth: Payer: Self-pay | Admitting: Family Medicine

## 2017-02-09 ENCOUNTER — Emergency Department (HOSPITAL_BASED_OUTPATIENT_CLINIC_OR_DEPARTMENT_OTHER): Payer: Medicare Other

## 2017-02-09 ENCOUNTER — Encounter (HOSPITAL_BASED_OUTPATIENT_CLINIC_OR_DEPARTMENT_OTHER): Payer: Self-pay

## 2017-02-09 ENCOUNTER — Emergency Department (HOSPITAL_BASED_OUTPATIENT_CLINIC_OR_DEPARTMENT_OTHER)
Admission: EM | Admit: 2017-02-09 | Discharge: 2017-02-09 | Disposition: A | Discharge status: Home / Self Care | Payer: Medicare Other | Attending: Emergency Medicine | Admitting: Emergency Medicine

## 2017-02-09 DIAGNOSIS — K8020 Calculus of gallbladder without cholecystitis without obstruction: Secondary | ICD-10-CM | POA: Diagnosis not present

## 2017-02-09 DIAGNOSIS — E119 Type 2 diabetes mellitus without complications: Secondary | ICD-10-CM | POA: Diagnosis not present

## 2017-02-09 DIAGNOSIS — R1011 Right upper quadrant pain: Secondary | ICD-10-CM

## 2017-02-09 DIAGNOSIS — Z79899 Other long term (current) drug therapy: Secondary | ICD-10-CM | POA: Diagnosis not present

## 2017-02-09 LAB — CBC WITH DIFFERENTIAL/PLATELET
Basophils Absolute: 0 10*3/uL (ref 0.0–0.1)
Basophils Relative: 0 %
Eosinophils Absolute: 0.3 10*3/uL (ref 0.0–0.7)
Eosinophils Relative: 3 %
HCT: 37 % — ABNORMAL LOW (ref 39.0–52.0)
Hemoglobin: 12.8 g/dL — ABNORMAL LOW (ref 13.0–17.0)
Lymphocytes Relative: 29 %
Lymphs Abs: 2.6 10*3/uL (ref 0.7–4.0)
MCH: 28.8 pg (ref 26.0–34.0)
MCHC: 34.6 g/dL (ref 30.0–36.0)
MCV: 83.1 fL (ref 78.0–100.0)
Monocytes Absolute: 0.6 10*3/uL (ref 0.1–1.0)
Monocytes Relative: 7 %
Neutro Abs: 5.4 10*3/uL (ref 1.7–7.7)
Neutrophils Relative %: 61 %
Platelets: 235 10*3/uL (ref 150–400)
RBC: 4.45 MIL/uL (ref 4.22–5.81)
RDW: 12.9 % (ref 11.5–15.5)
WBC: 8.9 10*3/uL (ref 4.0–10.5)

## 2017-02-09 LAB — COMPREHENSIVE METABOLIC PANEL
ALT: 28 U/L (ref 17–63)
AST: 21 U/L (ref 15–41)
Albumin: 4.5 g/dL (ref 3.5–5.0)
Alkaline Phosphatase: 42 U/L (ref 38–126)
Anion gap: 7 (ref 5–15)
BUN: 21 mg/dL — ABNORMAL HIGH (ref 6–20)
CO2: 28 mmol/L (ref 22–32)
Calcium: 9.6 mg/dL (ref 8.9–10.3)
Chloride: 102 mmol/L (ref 101–111)
Creatinine, Ser: 0.79 mg/dL (ref 0.61–1.24)
GFR calc Af Amer: >60 mL/min (ref >60)
GFR calc non Af Amer: >60 mL/min (ref >60)
Glucose, Bld: 111 mg/dL — ABNORMAL HIGH (ref 65–99)
Potassium: 4 mmol/L (ref 3.5–5.1)
Sodium: 137 mmol/L (ref 135–145)
Total Bilirubin: 0.8 mg/dL (ref 0.3–1.2)
Total Protein: 7.2 g/dL (ref 6.5–8.1)

## 2017-02-09 LAB — LIPASE, BLOOD: Lipase: 28 U/L (ref 11–51)

## 2017-02-09 MED ORDER — ONDANSETRON HCL 4 MG/2ML IJ SOLN
4.0000 mg | Freq: Once | INTRAMUSCULAR | Status: DC
  Filled 2017-02-09: qty 2

## 2017-02-09 MED ORDER — SODIUM CHLORIDE 0.9 % IV BOLUS (SEPSIS)
1000.0000 mL | Freq: Once | INTRAVENOUS | Status: AC
  Administered 2017-02-09: 1000 mL via INTRAVENOUS

## 2017-02-09 MED ORDER — MORPHINE SULFATE (PF) 4 MG/ML IV SOLN
4.0000 mg | Freq: Once | INTRAVENOUS | Status: DC
  Filled 2017-02-09: qty 1

## 2017-02-09 NOTE — ED Provider Notes (Signed)
Richard Noble   CSN: [id number] Arrival date & time: 02/09/17  1542   By signing my name below, I, Richard Noble, attest that this documentation has been prepared under the direction and in the presence of Richard Etienne, DO  Electronically Signed: Delton Noble, ED Scribe. 02/09/17. 5:07 PM.   History   Chief Complaint Chief Complaint  Patient presents with  . Abdominal Pain    HPI Comments:  Richard Noble is a 51 y.o. male, with a PSHx of appendectomy, who presents to the Emergency Department complaining of acute onset, persistent, moderate left sided abdominal pain beginning 3 days ago which worsened today. His pain is worse with palpation. No alleviating factors noted. Pt denies nausea, vomiting, fevers, diarrhea or any other associated symptoms. No other complaints noted at this time.   The history is provided by the patient. No language interpreter was used.  Abdominal Pain   This is a new problem. The current episode started more than 2 days ago. The problem occurs constantly. The problem has been gradually worsening. The pain is associated with an unknown factor. The pain is moderate. Pertinent negatives include fever, diarrhea, nausea, vomiting, headaches, arthralgias and myalgias. The symptoms are aggravated by palpation. Nothing relieves the symptoms.    Past Medical History:  Diagnosis Date  . Anxiety    uses valium for anxiety related to pain.   . Arthritis    stenosis, cervical area, arthritis  - spine   . Diabetes mellitus without complication ([place])    treated with diet only  . History of exercise stress test     in [place] state 20 yrs. ago had stress test & he was followed by cardiologist for a couple yrs. , but hasn't been referred since he has lived here.   Marland Kitchen History of kidney stones    found incidentally - no problems, just stable   . Hyperlipidemia   . Sleep concern    study done in [place], told that it was normal, sleep issue related to pain.        Patient Active Problem List   Diagnosis Date Noted  . Low HDL (under 40) 11/05/2016  . Prediabetes w/ h/o DM 11/05/2016  . Status post colonoscopy 2008 11/05/2016  . Status post lumbar spinal fusion 11/05/2016  . Not currently working due to disabled status 11/05/2016  . Status post cervical spinal fusion 11/05/2016  . Long-term use of high-risk medication 11/05/2016  . Muscle spasms of neck, upper back & lower back 11/05/2016  . Cervical spondylosis with radiculopathy 09/01/2016  . DDD (degenerative disc disease), lumbar 09/28/2015  . Insomnia 09/28/2015  . DDD (degenerative disc disease), cervical 09/05/2015  . Chronic pain associated with significant psychosocial dysfunction 09/05/2015    Past Surgical History:  Procedure Laterality Date  . ANTERIOR CERVICAL DECOMP/DISCECTOMY FUSION N/A 09/01/2016   Procedure: Cervical five-six, Cervical six-seven ANTERIOR CERVICAL DECOMPRESSION/DISCECTOMY FUSION;  Surgeon: Kevan Ny [name], MD;  Location: [facility];  Service: Neurosurgery;  Laterality: N/A;  . APPENDECTOMY  1984  . BACK SURGERY  2012    L5-S1 fusion  . BACK SURGERY  2016   C6-7 Decompression. [facility])       Home Medications    Prior to Admission medications   Medication Sig Start Date End Date Taking? Authorizing Provider  nortriptyline (PAMELOR) 10 MG capsule Take 10 mg by mouth at bedtime.   Yes Historical Provider, MD  diclofenac (FLECTOR) 1.3 % PTCH Place 1 patch  onto the skin 2 (two) times daily.    Historical Provider, MD  traMADol (ULTRAM) 50 MG tablet Take by mouth 3 (three) times daily.    Historical Provider, MD  Turmeric POWD by Does not apply route 2 (two) times daily. MIXES INTO A PASTE AND USES 1/4 OF TEASPOON TWICE DAILY    Historical Provider, MD  zolpidem (AMBIEN) 10 MG tablet Take 1 tablet (10 mg total) by mouth at bedtime as needed for sleep. 11/05/16   Mellody Dance, DO    Family History Family History  Problem Relation  Age of Onset  . Diabetes Mother   . Diabetes Brother   . Diabetes Sister   . Diabetes Brother     Social History Social History  Substance Use Topics  . Smoking status: Never Smoker  . Smokeless tobacco: Never Used  . Alcohol use 0.0 oz/week     Comment: weekly     Allergies   Trazodone and nefazodone   Review of Systems Review of Systems  Constitutional: Negative for chills and fever.  HENT: Negative for congestion and facial swelling.   Eyes: Negative for discharge and visual disturbance.  Respiratory: Negative for shortness of breath.   Cardiovascular: Negative for chest pain and palpitations.  Gastrointestinal: Positive for abdominal pain. Negative for diarrhea, nausea and vomiting.  Musculoskeletal: Negative for arthralgias and myalgias.  Skin: Negative for color change and rash.  Neurological: Negative for tremors, syncope and headaches.  Psychiatric/Behavioral: Negative for confusion and dysphoric mood.     Physical Exam Updated Vital Signs BP 118/70 (BP Location: Left Arm)   Pulse 78   Temp 98.5 F (36.9 C) (Oral)   Resp 18   Ht 5\' 5"  (1.651 m)   Wt 144 lb (65.3 kg)   SpO2 100%   BMI 23.96 kg/m   Physical Exam  Constitutional: He is oriented to person, place, and time. He appears well-developed and well-nourished.  HENT:  Head: Normocephalic and atraumatic.  Eyes: EOM are normal. Pupils are equal, round, and reactive to light.  Neck: Normal range of motion. Neck supple. No JVD present.  Cardiovascular: Normal rate and regular rhythm.  Exam reveals no gallop and no friction rub.   No murmur heard. Pulmonary/Chest: No respiratory distress. He has no wheezes.  Abdominal: Soft. He exhibits no distension. There is tenderness. There is no rebound, no guarding and negative Murphy's sign.  Diffuse right sided abdominal pain worse at the mid axillary line just underneath the costal margin. Negative Murphy's sign.   Musculoskeletal: Normal range of motion.    Neurological: He is alert and oriented to person, place, and time.  Skin: No rash noted. No pallor.  Psychiatric: He has a normal mood and affect. His behavior is normal.  Nursing Noble and vitals reviewed.  ED Treatments / Results  DIAGNOSTIC STUDIES:  Oxygen Saturation is 100% on RA, normal by my interpretation.    COORDINATION OF CARE:  4:56 PM Discussed treatment plan with pt at bedside and pt agreed to plan.  Labs (all labs ordered are listed, but only abnormal results are displayed) Labs Reviewed  CBC WITH DIFFERENTIAL/PLATELET - Abnormal; Notable for the following:       Result Value   Hemoglobin 12.8 (*)    HCT 37.0 (*)    All other components within normal limits  COMPREHENSIVE METABOLIC PANEL - Abnormal; Notable for the following:    Glucose, Bld 111 (*)    BUN 21 (*)    All other components within  normal limits  LIPASE, BLOOD    EKG  EKG Interpretation None       Radiology US Abdomen Limited Ruq  Result Date: 02/09/2017 CLINICAL DATA:  Right upper quadrant pain x4 days, increasing today. EXAM: US ABDOMEN LIMITED - RIGHT UPPER QUADRANT COMPARISON:  None. FINDINGS: Gallbladder: There are echogenic small-bowel calculi near the neck of the gallbladder measuring 2 mm at its largest. Along the nondependent wall of the gallbladder are noncalcified polyps largest measuring 4 mm. No wall thickening or pericholecystic fluid noted of the gallbladder. No sonographic Murphy sign noted by sonographer. Common bile duct: Diameter: 4.5 mm Liver: No focal lesion identified. Within normal limits in parenchymal echogenicity. IMPRESSION: Uncomplicated cholelithiasis. Gallbladder polyps measuring to 4 mm are also identified along the nondependent wall. Electronically Signed   By: Ashley Royalty M.D.   On: 02/09/2017 17:39    Procedures Procedures (including critical care time)  Medications Ordered in ED Medications  morphine 4 MG/ML injection 4 mg (4 mg Intravenous Not Given 02/09/17  1731)  ondansetron (ZOFRAN) injection 4 mg (4 mg Intravenous Not Given 02/09/17 1731)  sodium chloride 0.9 % bolus 1,000 mL (1,000 mLs Intravenous New Bag/Given 02/09/17 1730)     Initial Impression / Assessment and Plan / ED Course  I have reviewed the triage vital signs and the nursing notes.  Pertinent labs & imaging results that were available during my care of the patient were reviewed by me and considered in my medical decision making (see chart for details).     51 yo M With a chief complaint of right upper quadrant abdominal pain. Going on for the past week. Nothing seems to make it better or worse. Worsening today. Denies fevers or vomiting. On exam pain is worse to the right flank. Negative Murphy sign. Sent by his PCP for ultrasound. Ultrasound with cholelithiasis without cholecystitis.  Labs reassuring, pain well controlled on reexam.  D/c home, given surgery follow up.   6:19 PM:  I have discussed the diagnosis/risks/treatment options with the patient and believe the pt to be eligible for discharge home to follow-up with PCP. We also discussed returning to the ED immediately if new or worsening sx occur. We discussed the sx which are most concerning (e.g., sudden worsening pain, fever, inability to tolerate by mouth) that necessitate immediate return. Medications administered to the patient during their visit and any new prescriptions provided to the patient are listed below.  Medications given during this visit Medications  morphine 4 MG/ML injection 4 mg (4 mg Intravenous Not Given 02/09/17 1731)  ondansetron (ZOFRAN) injection 4 mg (4 mg Intravenous Not Given 02/09/17 1731)  sodium chloride 0.9 % bolus 1,000 mL (1,000 mLs Intravenous New Bag/Given 02/09/17 1730)     The patient appears reasonably screen and/or stabilized for discharge and I doubt any other medical condition or other [facility] requiring further screening, evaluation, or treatment in the ED at this time prior to  discharge.    Final Clinical Impressions(s) / ED Diagnoses   Final diagnoses:  RUQ abdominal pain  Calculus of gallbladder without cholecystitis without obstruction    New Prescriptions New Prescriptions   No medications on file  I personally performed the services described in this documentation, which was scribed in my presence. The recorded information has been reviewed and is accurate.      Richard Etienne, DO 02/09/17 1819

## 2017-02-09 NOTE — Discharge Instructions (Signed)
Follow up with general surgery.  Return for sudden worsening pain, fever.

## 2017-02-09 NOTE — Telephone Encounter (Signed)
Pt states he is having severe right side pain--pls call  ASAP. --glh

## 2017-02-09 NOTE — ED Notes (Signed)
Called x 1 for room, no answer

## 2017-02-09 NOTE — Telephone Encounter (Signed)
Pt c/o RUQ pain since starting nortriptyline, prescribed by his pain management doctor.  He states that it hurts worse when he pushes on the area.  Advised pt that he should be seen by UC or ER as he may need imaging studies, etc to r/o cholecystitis.  Pt expressed understanding and is agreeable.  Charyl Bigger, CMA

## 2017-02-09 NOTE — ED Notes (Signed)
ED Provider at bedside. 

## 2017-02-09 NOTE — ED Notes (Signed)
Patient transported to Ultrasound 

## 2017-02-09 NOTE — ED Triage Notes (Signed)
c/o generalized abd pain x 4 days-denies n/v/d-NAD-steady gait

## 2017-02-10 ENCOUNTER — Other Ambulatory Visit: Payer: Medicare Other

## 2017-02-17 ENCOUNTER — Ambulatory Visit: Payer: Medicare Other | Admitting: Family Medicine

## 2017-03-12 ENCOUNTER — Ambulatory Visit: Payer: Medicare Other | Admitting: Family Medicine

## 2017-03-19 ENCOUNTER — Ambulatory Visit (INDEPENDENT_AMBULATORY_CARE_PROVIDER_SITE_OTHER): Payer: Medicare Other | Admitting: Family Medicine

## 2017-03-19 ENCOUNTER — Encounter: Payer: Self-pay | Admitting: Family Medicine

## 2017-03-19 VITALS — BP 118/74 | HR 62 | Ht 65.0 in | Wt 147.0 lb

## 2017-03-19 DIAGNOSIS — L237 Allergic contact dermatitis due to plants, except food: Secondary | ICD-10-CM

## 2017-03-19 DIAGNOSIS — M62838 Other muscle spasm: Secondary | ICD-10-CM

## 2017-03-19 DIAGNOSIS — G894 Chronic pain syndrome: Secondary | ICD-10-CM | POA: Diagnosis not present

## 2017-03-19 DIAGNOSIS — R7303 Prediabetes: Secondary | ICD-10-CM

## 2017-03-19 DIAGNOSIS — G4700 Insomnia, unspecified: Secondary | ICD-10-CM

## 2017-03-19 LAB — POCT UA - MICROALBUMIN
Albumin/Creatinine Ratio, Urine, POC: <30
Creatinine, POC: 100 mg/dL
Microalbumin Ur, POC: 10 mg/L

## 2017-03-19 MED ORDER — ZOLPIDEM TARTRATE 10 MG PO TABS: 10.0000 mg | ORAL_TABLET | Freq: Every evening | ORAL | 1 refills | Status: DC | PRN

## 2017-03-19 MED ORDER — PREDNISONE 20 MG PO TABS: ORAL_TABLET | ORAL | 0 refills | Status: DC

## 2017-03-19 MED ORDER — DICLOFENAC EPOLAMINE 1.3 % TD PTCH: 1.0000 | MEDICATED_PATCH | Freq: Two times a day (BID) | TRANSDERMAL | 2 refills | Status: DC

## 2017-03-19 NOTE — Progress Notes (Signed)
Impression and Recommendations:    1. Poison ivy dermatitis   2. Prediabetes w/ h/o DM   3. Chronic pain associated with significant psychosocial dysfunction   4. Muscle spasms of neck, upper back & lower back      Prediabetes w/ h/o DM 112 FBS   The patient was counseled, risk factors were discussed, anticipatory guidance given.    Meds ordered this encounter  Medications  . diclofenac (FLECTOR) 1.3 % PTCH    Sig: Place 1 patch onto the skin 2 (two) times daily.    Dispense:  60 patch    Refill:  2  . predniSONE (DELTASONE) 20 MG tablet    Sig: Take 3 tabs po * 2 days, then 2 tabs for 2 d, then 1 tab 2 d, then 1/2 tab 2 days.    Dispense:  15 tablet    Refill:  0     Discontinued Medications   No medications on file      No orders of the defined types were placed in this encounter.    Gross side effects, risk and benefits, and alternatives of medications and treatment plan in general discussed with patient.  Patient is aware that all medications have potential side effects and we are unable to predict every side effect or drug-drug interaction that may occur.   Patient will call with any questions prior to using medication if they have concerns.  Expresses verbal understanding and consents to current therapy and treatment regimen.  No barriers to understanding were identified.  Red flag symptoms and signs discussed in detail.  Patient expressed understanding regarding what to do in case of emergency\urgent symptoms  Please see AVS handed out to patient at the end of our visit for further patient instructions/ counseling done pertaining to today's office visit.   No Follow-up on file.     Note: This document was prepared using Dragon voice recognition software and may include unintentional dictation errors.  Richard Noble 11:06  AM --------------------------------------------------------------------------------------------------------------------------------------------------------------------------------------------------------------------------------------------    Subjective:    CC:  Chief Complaint  Patient presents with  . Diabetes  . Back Pain    HPI: Richard Noble is a 51 y.o. male who presents to [place] at [facility] today for issues as discussed below.  Discussed labs with pt--> a1c 6.2 but he was on steroids at the time.  Eats great---Very healthy   - PM and R / Pain Doc started him on Pamelor as we discussed last OV--> pt coming down on on the ambien and tramadol. Muscle spasms at ngiht are a little better and pt is a little closer tyo achieving his indepependence from disability.  This makes him feel more hopeful.   -    - Poison ivy rash b/l arms and legs- for about 1 week- after gardening. Not Spreading but itch has been bad, used an OTC spray which is minimal help.      - going to gym 5 d/week ; started 10 min now up to 20 min fast wlaking since we last met in Jan.  Starting swimming as well- at [facility].   - goes to a counselor wkly for CBT --> now a little less - "Richard Noble".        Wt Readings from Last 3 Encounters:  03/19/17 147 lb (66.7 kg)  02/09/17 144 lb (65.3 kg)  11/05/16 146 lb 12.8 oz (66.6 kg)   BP Readings from Last 3 Encounters:  03/19/17  118/74  02/09/17 119/83  11/05/16 (!) 85/47   Pulse Readings from Last 3 Encounters:  03/19/17 62  02/09/17 65  11/05/16 74   BMI Readings from Last 3 Encounters:  03/19/17 24.46 kg/m  02/09/17 23.96 kg/m  11/05/16 24.43 kg/m     Patient Care Team    Relationship Specialty Notifications Start End  Mellody Dance, DO PCP - General Family Medicine  11/05/16   Carlyle Dolly, MD Resident Family Medicine All results, Admissions 10/03/15   [name], Kevan Ny, MD Consulting Physician Neurosurgery   11/05/16   Marlaine Hind, MD Consulting Physician Physical Medicine and Rehabilitation  11/05/16    Comment: pain Doc     Patient Active Problem List   Diagnosis Date Noted  . Low HDL (under 40) 11/05/2016    Priority: High  . Prediabetes w/ h/o DM 11/05/2016    Priority: High  . Status post colonoscopy 2008 11/05/2016    Priority: High  . Insomnia 09/28/2015    Priority: Medium  . Chronic pain associated with significant psychosocial dysfunction 09/05/2015    Priority: Medium  . Status post lumbar spinal fusion 11/05/2016    Priority: Low  . Not currently working due to disabled status 11/05/2016    Priority: Low  . Status post cervical spinal fusion 11/05/2016    Priority: Low  . Long-term use of high-risk medication 11/05/2016    Priority: Low  . Muscle spasms of neck, upper back & lower back 11/05/2016    Priority: Low  . Cervical spondylosis with radiculopathy 09/01/2016  . DDD (degenerative disc disease), lumbar 09/28/2015  . DDD (degenerative disc disease), cervical 09/05/2015    Past Medical history, Surgical history, Family history, Social history, Allergies and Medications have been entered into the medical record, reviewed and changed as needed.    Current Meds  Medication Sig  . diclofenac (FLECTOR) 1.3 % PTCH Place 1 patch onto the skin 2 (two) times daily.  . nortriptyline (PAMELOR) 10 MG capsule Take 10 mg by mouth at bedtime.  . traMADol (ULTRAM) 50 MG tablet Take by mouth 3 (three) times daily.  . Turmeric POWD by Does not apply route 2 (two) times daily. MIXES INTO A PASTE AND USES 1/4 OF TEASPOON TWICE DAILY  . zolpidem (AMBIEN) 10 MG tablet Take 1 tablet (10 mg total) by mouth at bedtime as needed for sleep.  . [DISCONTINUED] diclofenac (FLECTOR) 1.3 % PTCH Place 1 patch onto the skin 2 (two) times daily.    Allergies:  Allergies  Allergen Reactions  . Trazodone And Nefazodone Other (See Comments)    Passes out     Review of Systems: General:    Denies fever, chills, unexplained weight loss.  Optho/Auditory:   Denies visual changes, blurred vision/LOV Respiratory:   Denies wheeze, DOE more than baseline levels.  Cardiovascular:   Denies chest pain, palpitations, new onset peripheral edema  Gastrointestinal:   Denies nausea, vomiting, diarrhea, abd pain.  Genitourinary: Denies dysuria, freq/ urgency, flank pain or discharge from genitals.  Endocrine:     Denies hot or cold intolerance, polyuria, polydipsia. Musculoskeletal:   Denies unexplained myalgias, joint swelling, unexplained arthralgias, gait problems.  Skin:  Denies new onset rash, suspicious lesions Neurological:     Denies dizziness, unexplained weakness, numbness  Psychiatric/Behavioral:   Denies mood changes, suicidal or homicidal ideations, hallucinations    Objective:   Blood pressure 118/74, pulse 62, height _0  (1.651 m), weight 147 lb (66.7 kg). Body mass index is 24.46 kg/m.  General:  Well Developed, well nourished, appropriate for stated age.  Neuro:  Alert and oriented,  extra-ocular muscles intact  HEENT:  Normocephalic, atraumatic, neck supple, no carotid bruits appreciated  Skin:  no gross rash, warm, pink. Cardiac:  RRR, S1 S2 Respiratory:  ECTA B/L and A/P, Not using accessory muscles, speaking in full sentences- unlabored. Vascular:  Ext warm, no cyanosis apprec.; cap RF less 2 sec. Psych:  No HI/SI, judgement and insight good, Euthymic mood. Full Affect.

## 2017-03-19 NOTE — Assessment & Plan Note (Signed)
112 FBS

## 2017-03-19 NOTE — Patient Instructions (Addendum)
Around mid October to late October come in for a physical exam.,  Fasting and first thing in the morning so we can check labs as well as your testosterone.

## 2017-07-15 ENCOUNTER — Encounter: Payer: Self-pay | Admitting: Family Medicine

## 2017-07-15 ENCOUNTER — Ambulatory Visit (INDEPENDENT_AMBULATORY_CARE_PROVIDER_SITE_OTHER): Payer: Medicare Other | Admitting: Family Medicine

## 2017-07-15 VITALS — BP 128/68 | HR 90 | Ht 65.0 in | Wt 147.9 lb

## 2017-07-15 DIAGNOSIS — G4700 Insomnia, unspecified: Secondary | ICD-10-CM

## 2017-07-15 DIAGNOSIS — R7303 Prediabetes: Secondary | ICD-10-CM

## 2017-07-15 DIAGNOSIS — G894 Chronic pain syndrome: Secondary | ICD-10-CM

## 2017-07-15 DIAGNOSIS — Z23 Encounter for immunization: Secondary | ICD-10-CM | POA: Diagnosis not present

## 2017-07-15 DIAGNOSIS — M62838 Other muscle spasm: Secondary | ICD-10-CM | POA: Diagnosis not present

## 2017-07-15 DIAGNOSIS — E119 Type 2 diabetes mellitus without complications: Secondary | ICD-10-CM

## 2017-07-15 LAB — POCT GLYCOSYLATED HEMOGLOBIN (HGB A1C): Hemoglobin A1C: 6.1

## 2017-07-15 MED ORDER — ZOLPIDEM TARTRATE 10 MG PO TABS: 10.0000 mg | ORAL_TABLET | Freq: Every evening | ORAL | 1 refills | Status: DC | PRN

## 2017-07-15 NOTE — Patient Instructions (Addendum)
today we discussed you looking into the possibility of doing meditation for chronic pain\muscular pain. this would be excellent to help you relax those muscles.   You can also look at something called progressive muscular relaxation techniques.    Please up your vitamin D3 to 2000 IUs daily in addition to your multivitamin- in 6 months we can recheck all of your labs including your vitamin D, testopsterone and A1c again, etc etc.   PUREWAVE CM7 CORDLESS MUSCLE, JOINT AND FACIAL MASSAGER (DUAL MOTOR) Muscle and tendon pain melt away under the healing touch of the CM7. The powerful percussion head penetrates deep into muscle tissue to relieve pain and speed muscle recovery. The speed dial also lets you dial back the intensity, to treat sore tendons and joints.

## 2017-07-15 NOTE — Progress Notes (Signed)
Impression and Recommendations:    1. Need for Tdap vaccination   2. Need for influenza vaccination   3. Prediabetes w/ h/o DM   4. Insomnia, unspecified type   5. Muscle spasms of neck, upper back & lower back   6. Chronic pain associated with significant psychosocial dysfunction   7. Chronic pain syndrome   8. Diabetes mellitus, stable ([place])     1. Need for Tdap vaccination  - Tdap vaccine greater than or equal to 51yo IM  2. Need for influenza vaccination  - Flu Vaccine QUAD 36+ mos IM  3. Prediabetes w/ h/o DM  - POCT glycosylated hemoglobin (Hb A1C)   today we discussed you looking into the possibility of doing meditation for chronic pain\muscular pain. this would be excellent to help you relax those muscles.   You can also look at something called progressive muscular relaxation techniques.    Please up your vitamin D3 to 2000 IUs daily in addition to your multivitamin- in 6 months we can recheck all of your labs including your vitamin D, testopsterone and A1c again, etc etc.  The patient was counseled, risk factors were discussed, anticipatory guidance given.   Meds ordered this encounter  Medications  . zolpidem (AMBIEN) 10 MG tablet    Sig: Take 1 tablet (10 mg total) by mouth at bedtime as needed for sleep.    Dispense:  90 tablet    Refill:  1     Orders Placed This Encounter  Procedures  . POCT glycosylated hemoglobin (Hb A1C)    Gross side effects, risk and benefits, and alternatives of medications and treatment plan in general discussed with patient.  Patient is aware that all medications have potential side effects and we are unable to predict every side effect or drug-drug interaction that may occur.   Patient will call with any questions prior to using medication if they have concerns.  Expresses verbal understanding and consents to current therapy and treatment regimen.  No barriers to understanding were identified.  Red flag symptoms and signs  discussed in detail.  Patient expressed understanding regarding what to do in case of emergency\urgent symptoms  Please see AVS handed out to patient at the end of our visit for further patient instructions/ counseling done pertaining to today's office visit.   Return for 6 month follow-up with me, please come fasting.  We will obtain blood work.     Note: This document was prepared using Dragon voice recognition software and may include unintentional dictation errors.  Berna Spare Danford 3:55 PM --------------------------------------------------------------------------------------------------------------------------------------------------------------------------------------------------------------------------------------------    Subjective:    CC:  Chief Complaint  Patient presents with  . Diabetes    HPI: Richard Noble is a 51 y.o. male who presents to [place] at [facility] today for issues as discussed below.  Started biofeedback 2 wks ago.   Joined [employer]- to [employer] so he could strengthen his back and upper back muscles.  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  -Pain doc hasn't prescribed Flector patch 2 yrs ago - is expensive and pt rarely uses it, but wanted new script.  Pamelor--> 20mg  q hs--> helping a lot.  Especially along with the tramadol.  He feels very fortunate for being on this medicine.  Ambien--->  Needs Ambien to help sleep. Been on it for 3-4 yrs.  Last yr went off completely- for one month make sure he was  not addicted to it.  But sleep was horrible and he had to go back on.     Pre-DM-->   Trying to eat no carb, greeting back into gym- swimming.   Really very little cardio.  Yoga- on Tues.  Going well.    T level -  was low 2 yrs ago due to stopping exercising.  Fatigue was bad--> pt didn;t take meds for it b/c he declined the shots for it.       No problems updated.   Wt Readings from Last 3 Encounters:    07/15/17 147 lb 14.4 oz (67.1 kg)  03/19/17 147 lb (66.7 kg)  02/09/17 144 lb (65.3 kg)   BP Readings from Last 3 Encounters:  07/15/17 128/68  03/19/17 118/74  02/09/17 119/83   Pulse Readings from Last 3 Encounters:  07/15/17 90  03/19/17 62  02/09/17 65   BMI Readings from Last 3 Encounters:  07/15/17 24.61 kg/m  03/19/17 24.46 kg/m  02/09/17 23.96 kg/m     Patient Care Team    Relationship Specialty Notifications Start End  Mellody Dance, DO PCP - General Family Medicine  11/05/16   Carlyle Dolly, MD Resident Family Medicine All results, Admissions 10/03/15   [name], Kevan Ny, MD Consulting Physician Neurosurgery  11/05/16   Marlaine Hind, MD Consulting Physician Physical Medicine and Rehabilitation  11/05/16    Comment: pain Doc     Patient Active Problem List   Diagnosis Date Noted  . Low HDL (under 40) 11/05/2016  . Diabetes mellitus, stable ([place]) 11/05/2016  . Status post colonoscopy 2008 11/05/2016  . Status post lumbar spinal fusion 11/05/2016  . Not currently working due to disabled status 11/05/2016  . Status post cervical spinal fusion 11/05/2016  . Long-term use of high-risk medication 11/05/2016  . Muscle spasms of neck, upper back & lower back 11/05/2016  . Osteoarthritis of spine with radiculopathy, cervical region 09/01/2016  . DDD (degenerative disc disease), lumbar 09/28/2015  . Insomnia 09/28/2015  . DDD (degenerative disc disease), cervical 09/05/2015  . Chronic pain associated with significant psychosocial dysfunction 09/05/2015  . Chronic pain syndrome 09/05/2015    Past Medical history, Surgical history, Family history, Social history, Allergies and Medications have been entered into the medical record, reviewed and changed as needed.    Current Meds  Medication Sig  . diclofenac (FLECTOR) 1.3 % PTCH Place 1 patch onto the skin 2 (two) times daily.  . nortriptyline (PAMELOR) 10 MG capsule Take 20 mg by mouth at bedtime.    . traMADol (ULTRAM) 50 MG tablet Take by mouth 3 (three) times daily.  . Turmeric POWD by Does not apply route 2 (two) times daily. MIXES INTO A PASTE AND USES 1/4 OF TEASPOON TWICE DAILY  . zolpidem (AMBIEN) 10 MG tablet Take 1 tablet (10 mg total) by mouth at bedtime as needed for sleep.  . [DISCONTINUED] predniSONE (DELTASONE) 20 MG tablet Take 3 tabs po * 2 days, then 2 tabs for 2 d, then 1 tab 2 d, then 1/2 tab 2 days.  . [DISCONTINUED] zolpidem (AMBIEN) 10 MG tablet Take 1 tablet (10 mg total) by mouth at bedtime as needed for sleep.    Allergies:  Allergies  Allergen Reactions  . Trazodone And Nefazodone Other (See Comments)    Passes out     Review of Systems: General:   Denies fever, chills, unexplained weight loss.  Optho/Auditory:   Denies visual changes, blurred vision/LOV Respiratory:   Denies wheeze, DOE  more than baseline levels.  Cardiovascular:   Denies chest pain, palpitations, new onset peripheral edema  Gastrointestinal:   Denies nausea, vomiting, diarrhea, abd pain.  Genitourinary: Denies dysuria, freq/ urgency, flank pain or discharge from genitals.  Endocrine:     Denies hot or cold intolerance, polyuria, polydipsia. Musculoskeletal:   Denies unexplained myalgias, joint swelling, unexplained arthralgias, gait problems.  Skin:  Denies new onset rash, suspicious lesions Neurological:     Denies dizziness, unexplained weakness, numbness  Psychiatric/Behavioral:   Denies mood changes, suicidal or homicidal ideations, hallucinations    Objective:   Blood pressure 128/68, pulse 90, height 5\' 5"  (1.651 m), weight 147 lb 14.4 oz (67.1 kg). Body mass index is 24.61 kg/m. General:  Well Developed, well nourished, appropriate for stated age.  Neuro:  Alert and oriented,  extra-ocular muscles intact  HEENT:  Normocephalic, atraumatic, neck supple, no carotid bruits appreciated  Skin:  no gross rash, warm, pink. Cardiac:  RRR, S1 S2 Respiratory:  ECTA B/L and A/P,  Not using accessory muscles, speaking in full sentences- unlabored. Vascular:  Ext warm, no cyanosis apprec.; cap RF less 2 sec. Psych:  No HI/SI, judgement and insight good, Euthymic mood. Full Affect.

## 2017-07-17 ENCOUNTER — Ambulatory Visit: Payer: Medicare Other

## 2017-07-17 ENCOUNTER — Telehealth: Payer: Self-pay | Admitting: Family Medicine

## 2017-07-17 NOTE — Telephone Encounter (Signed)
Patient called states he was suppose to have Blood draw & flu shot at his appt on 10/3 but forgot -- ask if we can do it today-- advised that we close office at [facility] today if he can get here before closing--glh

## 2017-07-20 ENCOUNTER — Ambulatory Visit: Payer: Medicare Other

## 2017-07-22 ENCOUNTER — Encounter: Payer: Medicare Other | Admitting: Family Medicine

## 2017-08-13 DIAGNOSIS — M791 Myalgia, unspecified site: Secondary | ICD-10-CM | POA: Insufficient documentation

## 2017-11-03 IMAGING — RF DG C-ARM 61-120 MIN
1 series · 2 of 2 positions shown · non-contrast
Comparison: 08/06/2016

CLINICAL DATA: C5 through C7 ACDF

EXAM:
CERVICAL SPINE - 2-3 VIEW; DG C-ARM 61-120 MIN

[Series 1: run · 2 of 2 slices shown]
[im 1/2]
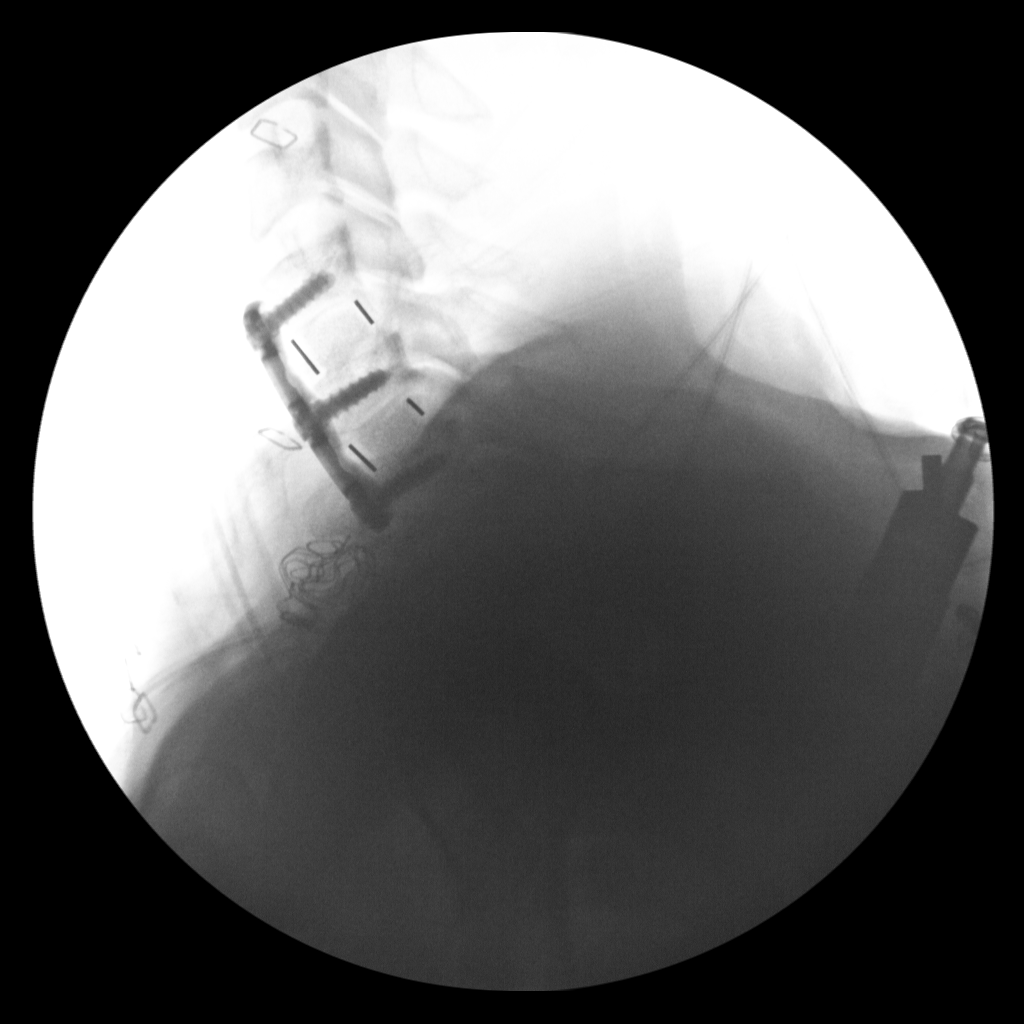
[im 2/2]
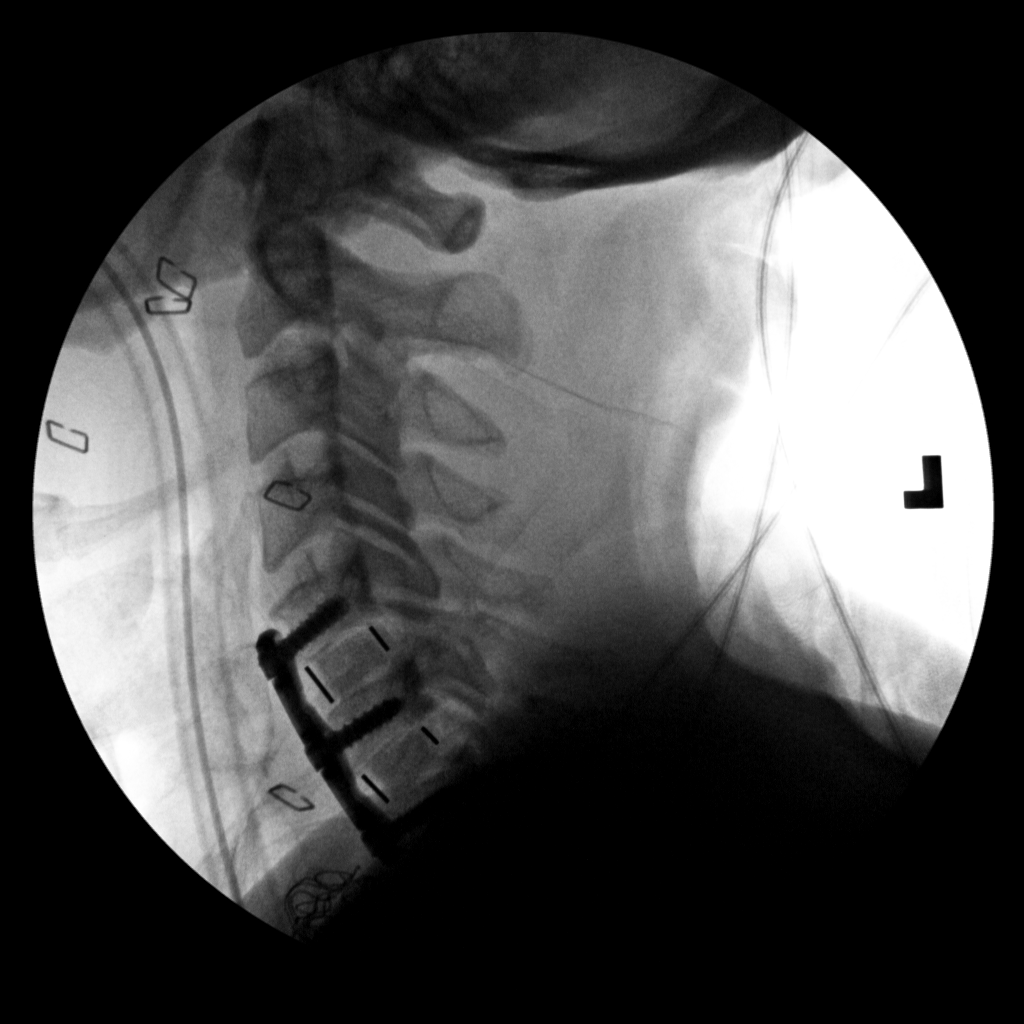

[2 of 2 positions shown; findings below may reference images not displayed]

FINDINGS: Two low resolution spot intraoperative images of the cervical spine
are submitted for interpretation. Total fluoroscopy time was 8
seconds.

Images demonstrate lateral projection of the cervical spine.
Multiple staples are in place. There is anterior surgical plate and
screw fixation from C5 through C7 with interbody devices in place.
There is probable sponge material on the initial image near the
inferior anterior aspect of the surgical plate.
IMPRESSION: Intraoperative fluoroscopic spot images obtained during C5 through
C7 ACDF.

## 2017-11-23 DIAGNOSIS — Z79899 Other long term (current) drug therapy: Secondary | ICD-10-CM | POA: Diagnosis not present

## 2017-11-23 DIAGNOSIS — M961 Postlaminectomy syndrome, not elsewhere classified: Secondary | ICD-10-CM | POA: Diagnosis not present

## 2017-11-23 DIAGNOSIS — M791 Myalgia, unspecified site: Secondary | ICD-10-CM | POA: Diagnosis not present

## 2017-12-21 ENCOUNTER — Telehealth: Payer: Self-pay | Admitting: Family Medicine

## 2017-12-25 ENCOUNTER — Encounter: Payer: Self-pay | Admitting: Gastroenterology

## 2018-01-01 ENCOUNTER — Encounter: Payer: Self-pay | Admitting: Family Medicine

## 2018-01-01 ENCOUNTER — Ambulatory Visit: Payer: 59 | Admitting: Podiatry

## 2018-01-01 ENCOUNTER — Ambulatory Visit: Payer: Managed Care, Other (non HMO) | Admitting: Family Medicine

## 2018-01-01 VITALS — BP 118/76 | HR 80 | Ht 65.0 in | Wt 153.8 lb

## 2018-01-01 DIAGNOSIS — G894 Chronic pain syndrome: Secondary | ICD-10-CM | POA: Diagnosis not present

## 2018-01-01 DIAGNOSIS — M62838 Other muscle spasm: Secondary | ICD-10-CM | POA: Diagnosis not present

## 2018-01-01 DIAGNOSIS — E119 Type 2 diabetes mellitus without complications: Secondary | ICD-10-CM | POA: Diagnosis not present

## 2018-01-01 DIAGNOSIS — G4700 Insomnia, unspecified: Secondary | ICD-10-CM

## 2018-01-01 DIAGNOSIS — E785 Hyperlipidemia, unspecified: Secondary | ICD-10-CM | POA: Diagnosis not present

## 2018-01-01 DIAGNOSIS — E786 Lipoprotein deficiency: Secondary | ICD-10-CM | POA: Diagnosis not present

## 2018-01-01 DIAGNOSIS — E559 Vitamin D deficiency, unspecified: Secondary | ICD-10-CM | POA: Insufficient documentation

## 2018-01-01 DIAGNOSIS — E1169 Type 2 diabetes mellitus with other specified complication: Secondary | ICD-10-CM | POA: Diagnosis not present

## 2018-01-01 DIAGNOSIS — R7989 Other specified abnormal findings of blood chemistry: Secondary | ICD-10-CM

## 2018-01-01 MED ORDER — METHOCARBAMOL 500 MG PO TABS: 500.0000 mg | ORAL_TABLET | Freq: Four times a day (QID) | ORAL | 1 refills | Status: DC | PRN

## 2018-01-01 MED ORDER — ZOLPIDEM TARTRATE 10 MG PO TABS: 10.0000 mg | ORAL_TABLET | Freq: Every evening | ORAL | 1 refills | Status: DC | PRN

## 2018-01-01 NOTE — Progress Notes (Signed)
Impression and Recommendations:    1. Diabetes mellitus, stable ([place])   2. Hyperlipidemia associated with type 2 diabetes mellitus ([place])   3. Low HDL (under 40)   4. Insomnia, unspecified type   5. Vitamin D insufficiency   6. Low testosterone level in male   7. Muscle spasms of neck, upper back & lower back   8. Chronic pain associated with significant psychosocial dysfunction     1. Diabetes Mellitus - Diabetes is well controlled with diet and exercise. - Goal is to keep his A1c around 6.0.  - Patient knows that if A1c gets around 6.5, medications will need to be added to his treatment.  - Blood pressure continues to look good. - Goal is less than 120/80, which is where his measurements consistently lie.  - Reminded the patient that we need to continue to monitor his other systems due to his diabetes.  2. Hyperlipidemia  - Will continue to monitor and re-check/review his cholesterol in the future.  3. Insomnia - Patient is willing to try another muscle relaxer while sleeping.  Flexeril did not work for him in the past.  - Patient may continue to take Ambien.  Advised that if he takes the muscle relaxer and the Ambien together, his sedation effect will be stronger.  Recommended starting by trying half of the Ambien and half of the muscle relaxer at first.  4. General Health Maintenance - Strongly recommended that the patient begin using meditation.  Emphasized progressive muscle relaxation meditation techniques.  - Patient noted one incident of mild dizziness while working a few months ago that quickly resolved.  Was advised that it was likely seasonal/sinus/allergy related.  - Advised patient to continue exercising 5 days a week to manage his health.  - Recommended that the patient continue striving for at least 150 minutes of cardio per week according to the [facility].   - Healthy dietary habits encouraged, including low-carb, and high amounts of lean protein in diet.    - Patient should also consume adequate amounts of water - half of body weight in oz of water per day  5. Follow-Up - Blood work drawn today.  Patient is not fasting. - His blood sugar and triglycerides may be abnormal due to lack of full fast.   Education and routine counseling performed. Handouts provided.   No orders of the defined types were placed in this encounter.   No orders of the defined types were placed in this encounter.   Return for DM follow up every 4 mo or so.   The patient was counseled, risk factors were discussed, anticipatory guidance given.  Gross side effects, risk and benefits, and alternatives of medications discussed with patient.  Patient is aware that all medications have potential side effects and we are unable to predict every side effect or drug-drug interaction that may occur.  Expresses verbal understanding and consents to current therapy plan and treatment regimen.  Please see AVS handed out to patient at the end of our visit for further patient instructions/ counseling done pertaining to today's office visit.    Note: This document was prepared using Dragon voice recognition software and may include unintentional dictation errors.  This document serves as a record of services personally performed by Mellody Dance, DO. It was created on her behalf by [name], a trained medical scribe. The creation of this record is based on the scribe's personal observations and the provider's statements to them.   I  have reviewed the above medical documentation for accuracy and completeness and I concur.  Mellody Dance 01/05/18 8:20 AM    Subjective:    Chief Complaint  Patient presents with  . Follow-up    Richard Noble is a 52 y.o. male who presents to [place] [facility] today for Diabetes Management.   Here today to follow up about his diabetes and cholesterol.  Exercise & Mood Maintenance Has been going to  the gym 5 days per week.  He swims and does cardio, and then lifts weights, as much as he can.  His back is sore once in a while, but not as bad lately.  He has plans to go for biofeedback in the one clinic in [place] that performs it.  Is seeing a counselor every other week - Dr. Nira Conn from [facility].  She works with him on his chronic pain and family issues.  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  Overall, is definitely feeling better.  Low Testosterone Was concerned about his testosterone because it was low at one point.  Patient wants to know if it came back up.  Denies erectile dysfunction, and notes that his libido is good.  Hasn't noticed much of a change, and it's never been a problem.  Insomnia Difficult for him to sleep at night because lying still aggravates the pain in his neck.  His main problem is staying asleep.  He used flexeril in the past which never did anything for him.  General Health Saw sports medicine recently for a left hand finger injury.  Had one incident of mild dizziness while working a few months ago that quickly resolved.  DM HPI: -  He has been working on diet and exercise for diabetes.  Pt is not currently managed onmedications for diabetes.  Manages his diabetes with diet and exercise.  Home glucose readings range average 110, 105.  Denies lows. He eats pretty regularly, according to suggestions.  Eats a lot of fruits and vegetables, fish.   Denies polyuria/polydipsia. Denies hypo/ hyperglycemia symptoms  - He denies new onset of: chest pain, exercise intolerance, shortness of breath, dizziness, visual changes, headache, lower extremity swelling or claudication.   Last diabetic eye exam was No results found for: HMDIABEYEEXA  Foot exam- UTD  Last A1C in the office was:  Lab Results  Component Value Date   HGBA1C 6.1 07/15/2017   HGBA1C 6.2 (H) 02/04/2017   HGBA1C 5.9 (H) 08/27/2016    Lab Results   Component Value Date   MICROALBUR 10 03/19/2017   LDLCALC 102 (H) 02/04/2017   CREATININE 0.79 02/09/2017    Lab Results  Component Value Date   CHOL 157 02/04/2017   HDL 33 (L) 02/04/2017   LDLCALC 102 (H) 02/04/2017   TRIG 111 02/04/2017   CHOLHDL 4.8 02/04/2017    Hepatic Function Latest Ref Rng & Units 02/09/2017 02/04/2017 03/27/2016  Total Protein 6.5 - 8.1 g/dL 7.2 6.7 -  Albumin 3.5 - 5.0 g/dL 4.5 4.7 -  AST 15 - 41 U/L 21 25 19   ALT 17 - 63 U/L 28 32 23  Alk Phosphatase 38 - 126 U/L 42 44 38  Total Bilirubin 0.3 - 1.2 mg/dL 0.8 0.7 -    Last 3 blood pressure readings in our office are as follows: BP Readings from Last 3 Encounters:  01/01/18 118/76  07/15/17 128/68  03/19/17 118/74    BMI Readings from  Last 3 Encounters:  01/01/18 25.59 kg/m  07/15/17 24.61 kg/m  03/19/17 24.46 kg/m   Depression screen [facility] 2/9 01/01/2018 07/15/2017 09/28/2015  Decreased Interest 0 0 0  Down, Depressed, Hopeless 0 0 0  PHQ - 2 Score 0 0 0  Altered sleeping 0 3 -  Tired, decreased energy 0 3 -  Change in appetite 0 0 -  Feeling bad or failure about yourself  0 0 -  Trouble concentrating 0 0 -  Moving slowly or fidgety/restless 0 0 -  Suicidal thoughts 0 0 -  PHQ-9 Score 0 6 -  Difficult doing work/chores Not difficult at all Not difficult at all -     No problems updated.    Patient Care Team    Relationship Specialty Notifications Start End  Mellody Dance, DO PCP - General Family Medicine  11/05/16   Carlyle Dolly, MD Resident Family Medicine All results, Admissions 10/03/15   [name], Kevan Ny, MD Consulting Physician Neurosurgery  11/05/16   Marlaine Hind, MD Consulting Physician Physical Medicine and Rehabilitation  11/05/16    Comment: pain Doc     Patient Active Problem List   Diagnosis Date Noted  . Vitamin D insufficiency 01/01/2018  . Hyperlipidemia associated with type 2 diabetes mellitus ([place]) 01/01/2018  . Low testosterone level in  male 01/01/2018  . Low HDL (under 40) 11/05/2016  . Diabetes mellitus, stable ([place]) 11/05/2016  . Status post colonoscopy 2008 11/05/2016  . Status post lumbar spinal fusion 11/05/2016  . Not currently working due to disabled status 11/05/2016  . Status post cervical spinal fusion 11/05/2016  . Long-term use of high-risk medication 11/05/2016  . Muscle spasms of neck, upper back & lower back 11/05/2016  . Osteoarthritis of spine with radiculopathy, cervical region 09/01/2016  . DDD (degenerative disc disease), lumbar 09/28/2015  . Insomnia 09/28/2015  . DDD (degenerative disc disease), cervical 09/05/2015  . Chronic pain associated with significant psychosocial dysfunction 09/05/2015  . Chronic pain syndrome 09/05/2015  . Cervical stenosis of spine 09/05/2015  . Cervical pain (neck) 08/07/2015     Past Medical History:  Diagnosis Date  . Anxiety    uses valium for anxiety related to pain.   . Arthritis    stenosis, cervical area, arthritis  - spine   . Diabetes mellitus without complication ([place])    treated with diet only  . History of exercise stress test     in [place] yrs[place] ago had stress test & he was followed by cardiologist for a couple yrs. , but hasn't been referred since he has lived here.   Marland Kitchen History of kidney stones    found incidentally - no problems, just stable   . Hyperlipidemia   . Sleep concern    study done in [place], told that it was normal, sleep issue related to pain.       Past Surgical History:  Procedure Laterality Date  . ANTERIOR CERVICAL DECOMP/DISCECTOMY FUSION N/A 09/01/2016   Procedure: Cervical five-six, Cervical six-seven ANTERIOR CERVICAL DECOMPRESSION/DISCECTOMY FUSION;  Surgeon: Kevan Ny [name], MD;  Location: [facility];  Service: Neurosurgery;  Laterality: N/A;  . APPENDECTOMY  1984  . BACK SURGERY  2012    L5-S1 fusion  . BACK SURGERY  2016   C6-7 Decompression. [facility])     Family History  Problem Relation  Age of Onset  . Diabetes Mother   . Diabetes Brother   . Diabetes Sister   . Diabetes Brother  Social History   Substance and Sexual Activity  Drug Use No  ,  Social History   Substance and Sexual Activity  Alcohol Use Yes  . Alcohol/week: 0.0 oz   Comment: weekly  ,  Social History   Tobacco Use  Smoking Status Never Smoker  Smokeless Tobacco Never Used  ,    Current Outpatient Medications on File Prior to Visit  Medication Sig Dispense Refill  . diclofenac (FLECTOR) 1.3 % PTCH Place 1 patch onto the skin 2 (two) times daily. 60 patch 2  . nortriptyline (PAMELOR) 10 MG capsule Take 20 mg by mouth at bedtime.     . traMADol (ULTRAM) 50 MG tablet Take by mouth 3 (three) times daily.    . Turmeric POWD by Does not apply route 2 (two) times daily. MIXES INTO A PASTE AND USES 1/4 OF TEASPOON TWICE DAILY    . zolpidem (AMBIEN) 10 MG tablet Take 1 tablet (10 mg total) by mouth at bedtime as needed for sleep. 90 tablet 1   No current facility-administered medications on file prior to visit.      Allergies  Allergen Reactions  . Trazodone And Nefazodone Other (See Comments)    Passes out     Review of Systems:   General:  Denies fever, chills Optho/Auditory:   Denies visual changes, blurred vision Respiratory:   Denies SOB, cough, wheeze, DIB  Cardiovascular:   Denies chest pain, palpitations, painful respirations Gastrointestinal:   Denies nausea, vomiting, diarrhea.  Endocrine:     Denies new hot or cold intolerance Musculoskeletal:  Denies joint swelling, gait issues, or new unexplained myalgias/ arthralgias Skin:  Denies rash, suspicious lesions  Neurological:    Denies dizziness, unexplained weakness, numbness  Psychiatric/Behavioral:   Denies mood changes    Objective:     Blood pressure 118/76, pulse 80, height 5' 5"  (1.651 m), weight 153 lb 12.8 oz (69.8 kg), SpO2 99 %.  Body mass index is 25.59 kg/m.  General: Well Developed, well nourished,  and in no acute distress.  HEENT: Normocephalic, atraumatic, pupils equal round reactive to light, neck supple, No carotid bruits, no JVD Skin: Warm and dry, cap RF less 2 sec Cardiac: Regular rate and rhythm, S1, S2 WNL's, no murmurs rubs or gallops Respiratory: ECTA B/L, Not using accessory muscles, speaking in full sentences. NeuroM-Sk: Ambulates w/o assistance, moves ext * 4 w/o difficulty, sensation grossly intact.  Ext: scant edema b/l lower ext Psych: No HI/SI, judgement and insight good, Euthymic mood. Full Affect.

## 2018-01-01 NOTE — Patient Instructions (Addendum)
Please let me know how you do on the Robaxin for your muscles.  Please follow-up sooner than planned if you have any questions or concerns.  You can try meditation and/or progressive muscle relaxation meditation techniques.  These you can find on YouTube as guided muscle relaxation meditation techniques.  Please let me know how your biofeedback therapy goes.     Please realize, EXERCISE IS MEDICINE!  -  American Heart Association [facility]) guidelines for exercise : If you are in good health, without any medical conditions, you should engage in 150 minutes of moderate intensity aerobic activity per week.  This means you should be huffing and puffing throughout your workout.   Engaging in regular exercise will improve brain function and memory, as well as improve mood, boost immune system and help with weight management.  As well as the other, more well-known effects of exercise such as decreasing blood sugar levels, decreasing blood pressure,  and decreasing bad cholesterol levels/ increasing good cholesterol levels.     -  The AHA strongly endorses consumption of a diet that contains a variety of foods from all the food categories with an emphasis on fruits and vegetables; fat-free and low-fat dairy products; cereal and grain products; legumes and nuts; and fish, poultry, and/or extra lean meats.    Excessive food intake, especially of foods high in saturated and trans fats, sugar, and salt, should be avoided.    Adequate water intake of roughly 1/2 of your weight in pounds, should equal the ounces of water per day you should drink.  So for instance, if you're 200 pounds, that would be 100 ounces of water per day.         Mediterranean Diet  Why follow it? Research shows. . Those who follow the Mediterranean diet have a reduced risk of heart disease  . The diet is associated with a reduced incidence of Parkinson's and Alzheimer's diseases . People following the diet may have longer life  expectancies and lower rates of chronic diseases  . The Dietary Guidelines for Americans recommends the Mediterranean diet as an eating plan to promote health and prevent disease  What Is the Mediterranean Diet?  . Healthy eating plan based on typical foods and recipes of Mediterranean-style cooking . The diet is primarily a plant based diet; these foods should make up a majority of meals   Starches - Plant based foods should make up a majority of meals - They are an important sources of vitamins, minerals, energy, antioxidants, and fiber - Choose whole grains, foods high in fiber and minimally processed items  - Typical grain sources include wheat, oats, barley, corn, brown rice, bulgar, farro, millet, polenta, couscous  - Various types of beans include chickpeas, lentils, fava beans, black beans, white beans   Fruits  Veggies - Large quantities of antioxidant rich fruits & veggies; 6 or more servings  - Vegetables can be eaten raw or lightly drizzled with oil and cooked  - Vegetables common to the traditional Mediterranean Diet include: artichokes, arugula, beets, broccoli, brussel sprouts, cabbage, carrots, celery, collard greens, cucumbers, eggplant, kale, leeks, lemons, lettuce, mushrooms, okra, onions, peas, peppers, potatoes, pumpkin, radishes, rutabaga, shallots, spinach, sweet potatoes, turnips, zucchini - Fruits common to the Mediterranean Diet include: apples, apricots, avocados, cherries, clementines, dates, figs, grapefruits, grapes, melons, nectarines, oranges, peaches, pears, pomegranates, strawberries, tangerines  Fats - Replace butter and margarine with healthy oils, such as olive oil, canola oil, and tahini  - Limit nuts to no  more than a handful a day  - Nuts include walnuts, almonds, pecans, pistachios, pine nuts  - Limit or avoid candied, honey roasted or heavily salted nuts - Olives are central to the Mediterranean diet - can be eaten whole or used in a variety of dishes    Meats Protein - Limiting red meat: no more than a few times a month - When eating red meat: choose lean cuts and keep the portion to the size of deck of cards - Eggs: approx. 0 to 4 times a week  - Fish and lean poultry: at least 2 a week  - Healthy protein sources include, chicken, [place], lean beef, lamb - Increase intake of seafood such as tuna, salmon, trout, mackerel, shrimp, scallops - Avoid or limit high fat processed meats such as sausage and bacon  Dairy - Include moderate amounts of low fat dairy products  - Focus on healthy dairy such as fat free yogurt, skim milk, low or reduced fat cheese - Limit dairy products higher in fat such as whole or 2% milk, cheese, ice cream  Alcohol - Moderate amounts of red wine is ok  - No more than 5 oz daily for women (all ages) and men older than age 76  - No more than 10 oz of wine daily for men younger than 12  Other - Limit sweets and other desserts  - Use herbs and spices instead of salt to flavor foods  - Herbs and spices common to the traditional Mediterranean Diet include: basil, bay leaves, chives, cloves, cumin, fennel, garlic, lavender, marjoram, mint, oregano, parsley, pepper, rosemary, sage, savory, sumac, tarragon, thyme   It's not just a diet, it's a lifestyle:  . The Mediterranean diet includes lifestyle factors typical of those in the region  . Foods, drinks and meals are best eaten with others and savored . Daily physical activity is important for overall good health . This could be strenuous exercise like running and aerobics . This could also be more leisurely activities such as walking, housework, yard-work, or taking the stairs . Moderation is the key; a balanced and healthy diet accommodates most foods and drinks . Consider portion sizes and frequency of consumption of certain foods   Meal Ideas & Options:  . Breakfast:  o Whole wheat toast or whole wheat English muffins with peanut butter & hard boiled egg o Steel cut  oats topped with apples & cinnamon and skim milk  o Fresh fruit: banana, strawberries, melon, berries, peaches  o Smoothies: strawberries, bananas, greek yogurt, peanut butter o Low fat greek yogurt with blueberries and granola  o Egg white omelet with spinach and mushrooms o Breakfast couscous: whole wheat couscous, apricots, skim milk, cranberries  . Sandwiches:  o Hummus and grilled vegetables (peppers, zucchini, squash) on whole wheat bread   o Grilled chicken on whole wheat pita with lettuce, tomatoes, cucumbers or tzatziki  o Tuna salad on whole wheat bread: tuna salad made with greek yogurt, olives, red peppers, capers, green onions o Garlic rosemary lamb pita: lamb sauted with garlic, rosemary, salt & pepper; add lettuce, cucumber, greek yogurt to pita - flavor with lemon juice and black pepper  . Seafood:  o Mediterranean grilled salmon, seasoned with garlic, basil, parsley, lemon juice and black pepper o Shrimp, lemon, and spinach whole-grain pasta salad made with low fat greek yogurt  o Seared scallops with lemon orzo  o Seared tuna steaks seasoned salt, pepper, coriander topped with tomato mixture of olives, tomatoes,  olive oil, minced garlic, parsley, green onions and cappers  . Meats:  o Herbed greek chicken salad with kalamata olives, cucumber, feta  o Red bell peppers stuffed with spinach, bulgur, lean ground beef (or lentils) & topped with feta   o Kebabs: skewers of chicken, tomatoes, onions, zucchini, squash  o [place] burgers: made with red onions, mint, dill, lemon juice, feta cheese topped with roasted red peppers . Vegetarian o Cucumber salad: cucumbers, artichoke hearts, celery, red onion, feta cheese, tossed in olive oil & lemon juice  o Hummus and whole grain pita points with a greek salad (lettuce, tomato, feta, olives, cucumbers, red onion) o Lentil soup with celery, carrots made with vegetable broth, garlic, salt and pepper  o Tabouli salad: parsley, bulgur,  mint, scallions, cucumbers, tomato, radishes, lemon juice, olive oil, salt and pepper.

## 2018-01-03 LAB — COMPREHENSIVE METABOLIC PANEL
ALT: 65 IU/L — ABNORMAL HIGH (ref 0–44)
AST: 39 IU/L (ref 0–40)
Albumin/Globulin Ratio: 2 (ref 1.2–2.2)
Albumin: 4.9 g/dL (ref 3.5–5.5)
Alkaline Phosphatase: 46 IU/L (ref 39–117)
BUN/Creatinine Ratio: 34 — ABNORMAL HIGH (ref 9–20)
BUN: 25 mg/dL — ABNORMAL HIGH (ref 6–24)
Bilirubin Total: 0.6 mg/dL (ref 0.0–1.2)
CO2: 24 mmol/L (ref 20–29)
Calcium: 9.9 mg/dL (ref 8.7–10.2)
Chloride: 104 mmol/L (ref 96–106)
Creatinine, Ser: 0.73 mg/dL — ABNORMAL LOW (ref 0.76–1.27)
GFR calc Af Amer: 124 mL/min/{1.73_m2} (ref >59)
GFR calc non Af Amer: 107 mL/min/{1.73_m2} (ref >59)
Globulin, Total: 2.4 g/dL (ref 1.5–4.5)
Glucose: 116 mg/dL — ABNORMAL HIGH (ref 65–99)
Potassium: 4.5 mmol/L (ref 3.5–5.2)
Sodium: 142 mmol/L (ref 134–144)
Total Protein: 7.3 g/dL (ref 6.0–8.5)

## 2018-01-03 LAB — CBC WITH DIFFERENTIAL/PLATELET
Basophils Absolute: 0 10*3/uL (ref 0.0–0.2)
Basos: 0 %
EOS (ABSOLUTE): 0.3 10*3/uL (ref 0.0–0.4)
Eos: 5 %
Hematocrit: 39.8 % (ref 37.5–51.0)
Hemoglobin: 13.6 g/dL (ref 13.0–17.7)
Immature Grans (Abs): 0 10*3/uL (ref 0.0–0.1)
Immature Granulocytes: 1 %
Lymphocytes Absolute: 2.2 10*3/uL (ref 0.7–3.1)
Lymphs: 41 %
MCH: 28.8 pg (ref 26.6–33.0)
MCHC: 34.2 g/dL (ref 31.5–35.7)
MCV: 84 fL (ref 79–97)
Monocytes Absolute: 0.4 10*3/uL (ref 0.1–0.9)
Monocytes: 8 %
Neutrophils Absolute: 2.4 10*3/uL (ref 1.4–7.0)
Neutrophils: 45 %
Platelets: 263 10*3/uL (ref 150–379)
RBC: 4.73 x10E6/uL (ref 4.14–5.80)
RDW: 13.7 % (ref 12.3–15.4)
WBC: 5.4 10*3/uL (ref 3.4–10.8)

## 2018-01-03 LAB — B12 AND FOLATE PANEL
Folate: >20 ng/mL (ref >3.0)
Vitamin B-12: 658 pg/mL (ref 232–1245)

## 2018-01-03 LAB — LIPID PANEL
Chol/HDL Ratio: 6.4 ratio — ABNORMAL HIGH (ref 0.0–5.0)
Cholesterol, Total: 212 mg/dL — ABNORMAL HIGH (ref 100–199)
HDL: 33 mg/dL — ABNORMAL LOW (ref >39)
LDL Calculated: 139 mg/dL — ABNORMAL HIGH (ref 0–99)
Triglycerides: 198 mg/dL — ABNORMAL HIGH (ref 0–149)
VLDL Cholesterol Cal: 40 mg/dL (ref 5–40)

## 2018-01-03 LAB — TESTOSTERONE,FREE AND TOTAL
Testosterone, Free: 7 pg/mL — ABNORMAL LOW (ref 7.2–24.0)
Testosterone: 276 ng/dL (ref 264–916)

## 2018-01-03 LAB — HEMOGLOBIN A1C
Est. average glucose Bld gHb Est-mCnc: 137 mg/dL
Hgb A1c MFr Bld: 6.4 % — ABNORMAL HIGH (ref 4.8–5.6)

## 2018-01-03 LAB — MAGNESIUM: Magnesium: 2.2 mg/dL (ref 1.6–2.3)

## 2018-01-03 LAB — T4, FREE: Free T4: 1.03 ng/dL (ref 0.82–1.77)

## 2018-01-03 LAB — PHOSPHORUS: Phosphorus: 3.6 mg/dL (ref 2.5–4.5)

## 2018-01-03 LAB — VITAMIN D 25 HYDROXY (VIT D DEFICIENCY, FRACTURES): Vit D, 25-Hydroxy: 30.8 ng/mL (ref 30.0–100.0)

## 2018-01-03 LAB — TSH: TSH: 2.65 u[IU]/mL (ref 0.450–4.500)

## 2018-01-14 ENCOUNTER — Ambulatory Visit: Payer: Medicare Other | Admitting: Family Medicine

## 2018-02-16 ENCOUNTER — Other Ambulatory Visit: Payer: Self-pay

## 2018-02-16 ENCOUNTER — Ambulatory Visit (AMBULATORY_SURGERY_CENTER): Payer: Self-pay | Admitting: *Deleted

## 2018-02-16 VITALS — Ht 66.0 in | Wt 151.4 lb

## 2018-02-16 DIAGNOSIS — Z1211 Encounter for screening for malignant neoplasm of colon: Secondary | ICD-10-CM

## 2018-02-16 MED ORDER — NA SULFATE-K SULFATE-MG SULF 17.5-3.13-1.6 GM/177ML PO SOLN: 1.0000 [IU] | Freq: Once | ORAL | 0 refills | Status: AC

## 2018-02-16 NOTE — Progress Notes (Signed)
No egg or soy allergy known to patient  No issues with past sedation with any surgeries  or procedures, no intubation problems  No diet pills per patient No home 02 use per patient  No blood thinners per patient  Pt has some issues with constipation  Due to tramadol eats fiber/veggies  No A fib or A flutter  EMMI video sent to pt's e mail pt. declined

## 2018-02-26 ENCOUNTER — Encounter: Payer: Medicare Other | Admitting: Gastroenterology

## 2018-04-07 LAB — HM DIABETES EYE EXAM

## 2018-04-19 IMAGING — US US ABDOMEN LIMITED
1 series · 14 of 25 positions shown · non-contrast
Comparison: None.

CLINICAL DATA: Right upper quadrant pain x4 days, increasing today.

EXAM:
US ABDOMEN LIMITED - RIGHT UPPER QUADRANT

[Series 1: us abdomen limited · 0.17mm/px · 14 of 45 slices shown]
[im 1/45]
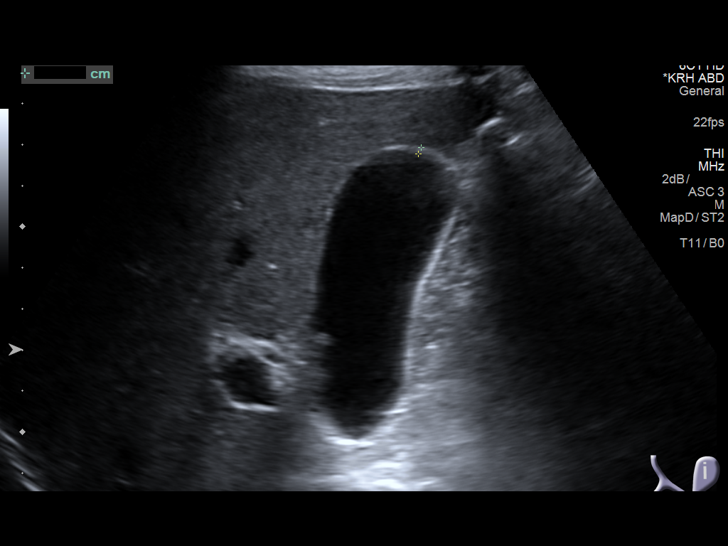
[im 4/45]
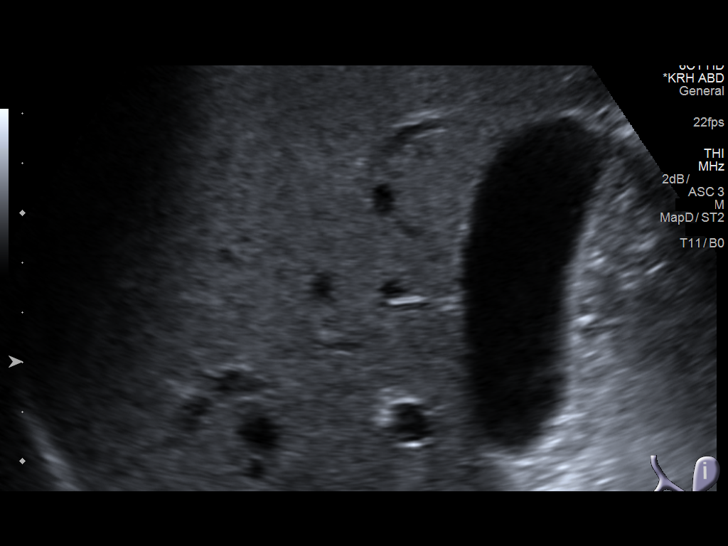
[im 8/45]
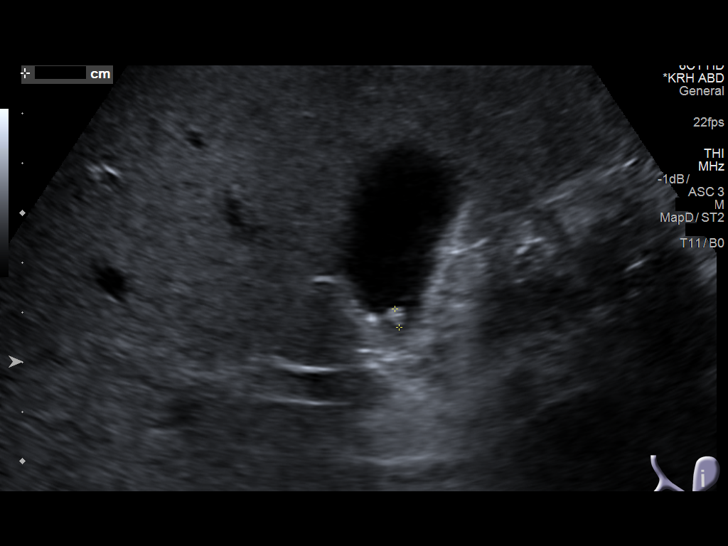
[im 12/45]
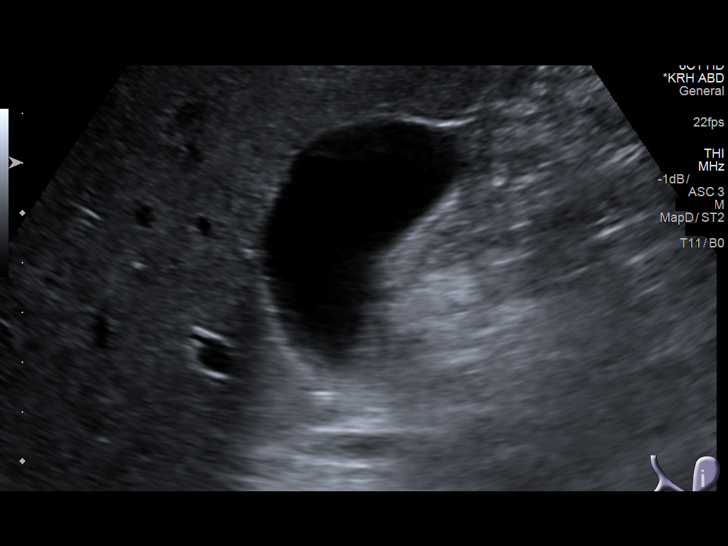
[im 15/45]
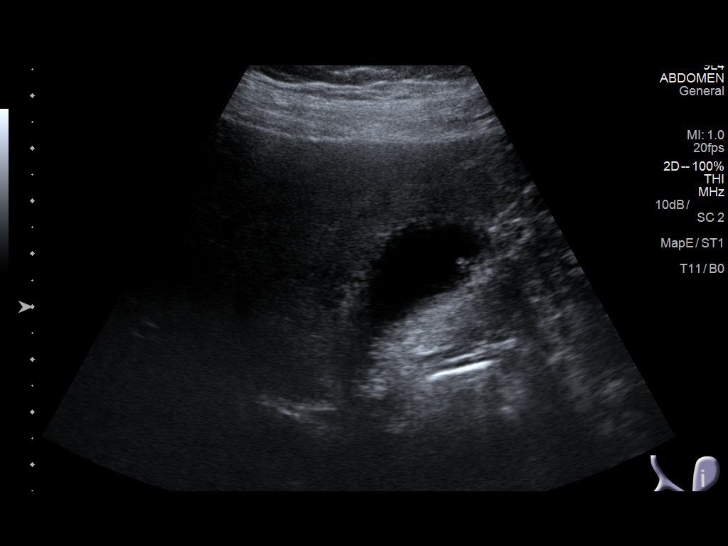
[im 17/45]
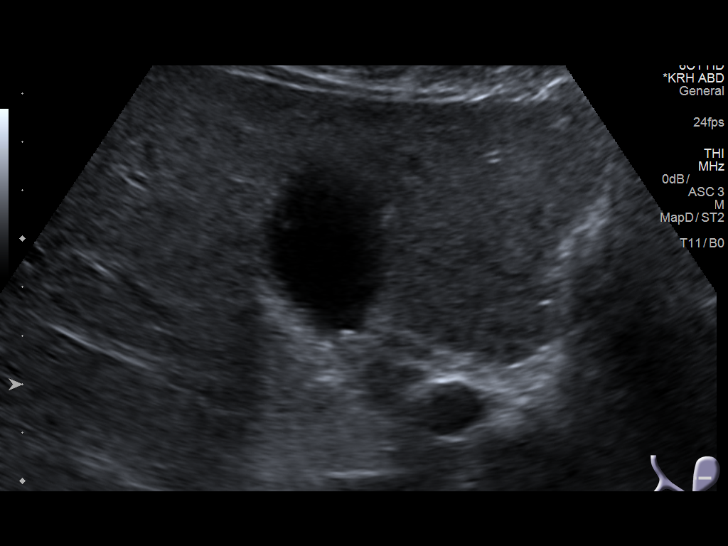
[im 21/45]
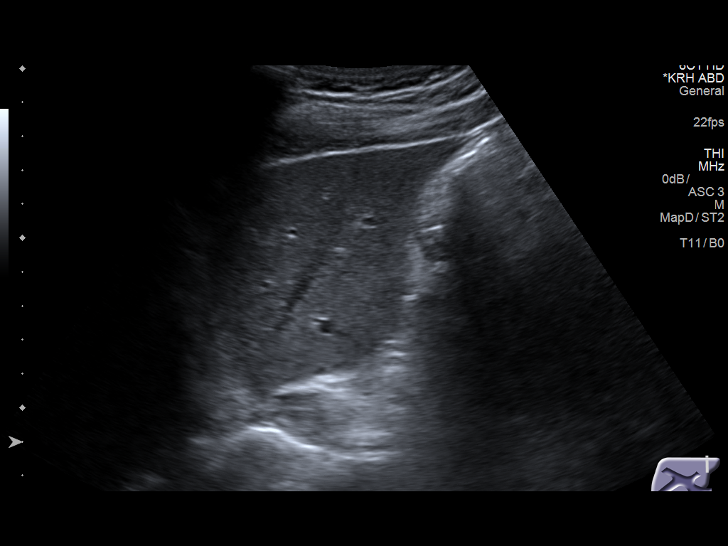
[im 24/45]
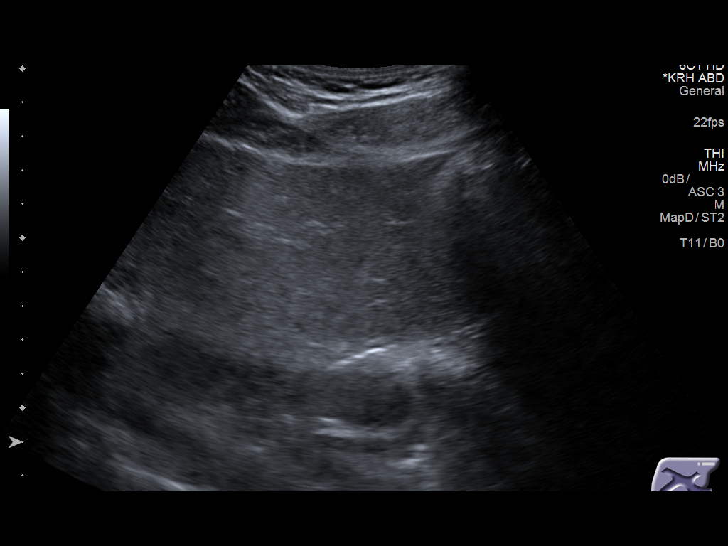
[im 28/45]
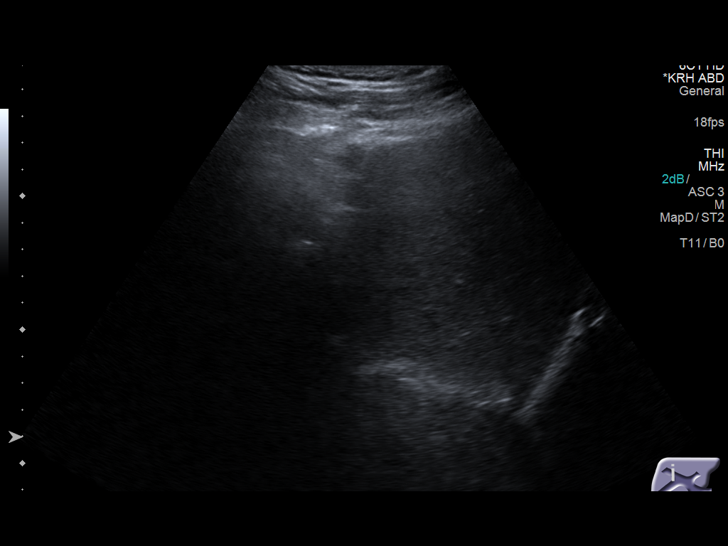
[im 30/45]
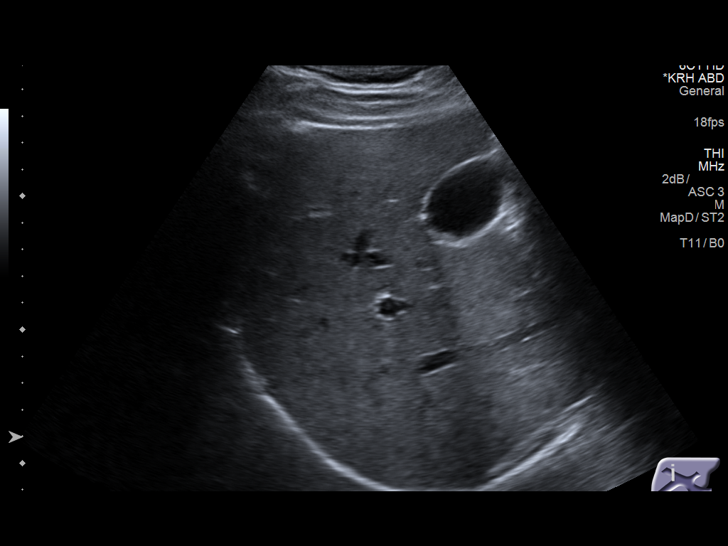
[im 34/45]
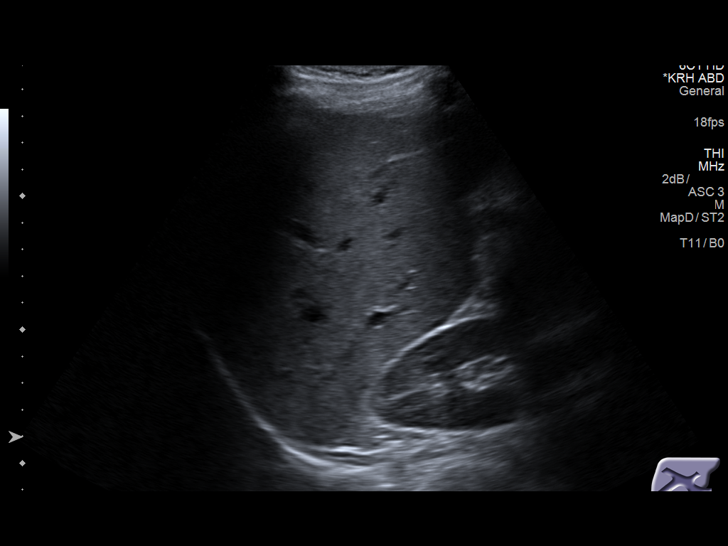
[im 37/45]
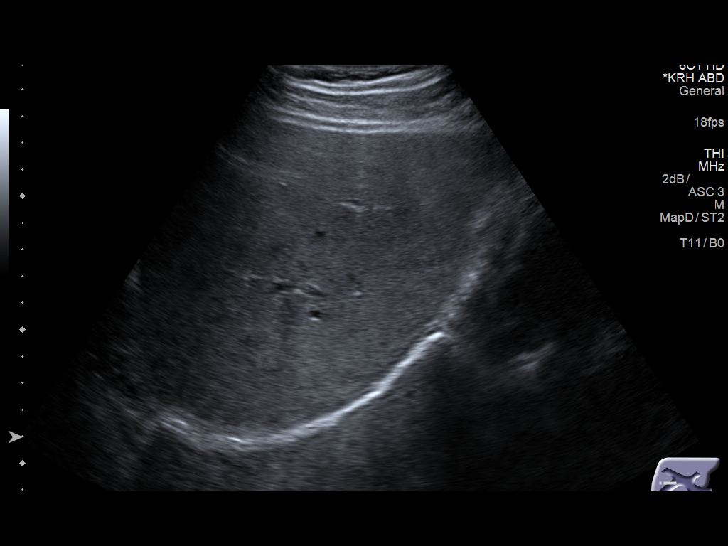
[im 41/45]
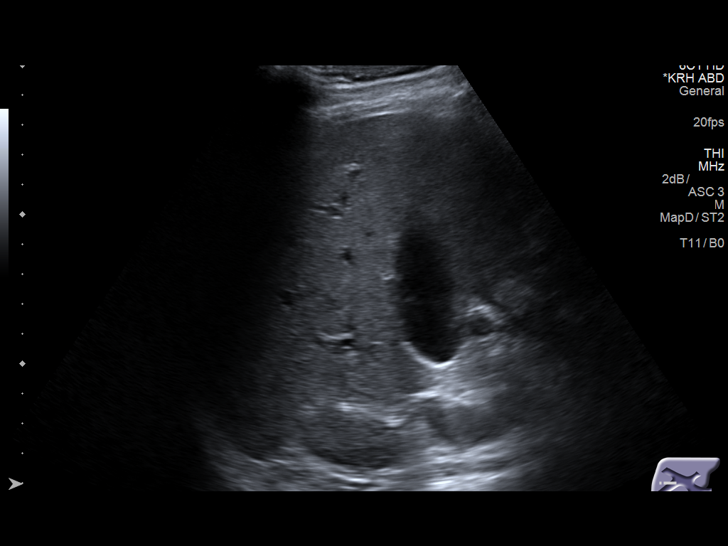
[im 45/45]
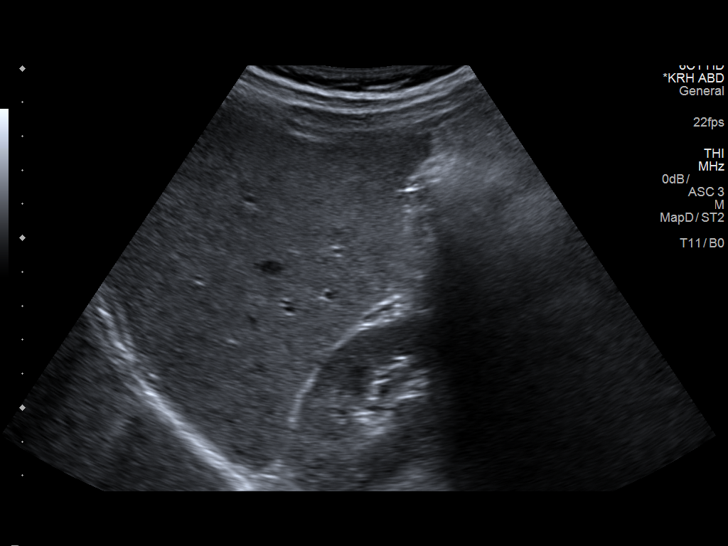

[14 of 25 positions shown; findings below may reference images not displayed]

FINDINGS: Gallbladder:

There are echogenic small-bowel calculi near the neck of the
gallbladder measuring 2 mm at its largest. Along the nondependent
wall of the gallbladder are noncalcified polyps largest measuring 4
mm. No wall thickening or pericholecystic fluid noted of the
gallbladder. No sonographic Murphy sign noted by sonographer.

Common bile duct:

Diameter: 4.5 mm

Liver:

No focal lesion identified. Within normal limits in parenchymal
echogenicity.
IMPRESSION: Uncomplicated cholelithiasis. Gallbladder polyps measuring to 4 mm
are also identified along the nondependent wall.

## 2018-05-03 ENCOUNTER — Encounter: Payer: Self-pay | Admitting: Family Medicine

## 2018-05-03 ENCOUNTER — Ambulatory Visit (INDEPENDENT_AMBULATORY_CARE_PROVIDER_SITE_OTHER): Payer: Managed Care, Other (non HMO) | Admitting: Family Medicine

## 2018-05-03 VITALS — BP 121/80 | HR 85 | Ht 66.0 in | Wt 145.0 lb

## 2018-05-03 DIAGNOSIS — G4700 Insomnia, unspecified: Secondary | ICD-10-CM | POA: Diagnosis not present

## 2018-05-03 DIAGNOSIS — Z9889 Other specified postprocedural states: Secondary | ICD-10-CM

## 2018-05-03 DIAGNOSIS — E119 Type 2 diabetes mellitus without complications: Secondary | ICD-10-CM

## 2018-05-03 LAB — POCT UA - MICROALBUMIN
Albumin/Creatinine Ratio, Urine, POC: <30
Creatinine, POC: 200 mg/dL
Microalbumin Ur, POC: 30 mg/L

## 2018-05-03 LAB — POCT GLYCOSYLATED HEMOGLOBIN (HGB A1C): HbA1c, POC (controlled diabetic range): 6.2 % (ref 0.0–7.0)

## 2018-05-03 MED ORDER — ZOLPIDEM TARTRATE 10 MG PO TABS: 10.0000 mg | ORAL_TABLET | Freq: Every evening | ORAL | 1 refills | Status: DC | PRN

## 2018-05-03 NOTE — Patient Instructions (Signed)

## 2018-05-03 NOTE — Progress Notes (Signed)
Impression and Recommendations:    1. Diabetes mellitus, stable ([place])   2. Insomnia, unspecified type   3. Status post colonoscopy 2008     1. Diabetes Mellitus - Symptoms remain well controlled with diet and exercise. - Reviewed that goal is A1c is ideally 6.0, but 6.2 or under is acceptable.  - Patient knows that if A1c increases near 6.5, medications will need to be added to his treatment.  2. Blood Pressure - Blood pressure continues to remain well controlled. - Reviewed goal as less than 120/80.  3. Hyperlipidemia  - Will continue to monitor and re-check/review his cholesterol in the future.  4. Insomnia - Patient continues to be managed successfully on Ambien & Tramadol.  Flexeril did not work for him in the past.    - Patient may continue to take Ambien and Tramadol to assist with sleep.  Refills provided today.  - Advised patient about alternative sleep aids and encouraged patient to work toward discontinuing Ambien if at all possible.  5. General Health Maintenance - Strongly recommended that the patient continue using meditation.  Patient may also continue biofeedback therapy as desired.  BMI Counseling Explained to patient what BMI refers to, and what it means medically.    Told patient to think about it as a "medical risk stratification measurement" and how increasing BMI is associated with increasing risk/ or worsening state of various diseases such as hypertension, hyperlipidemia, diabetes, premature OA, depression etc.  American Heart Association guidelines for healthy diet, basically Mediterranean diet, and exercise guidelines of 30 minutes 5 days per week or more discussed in detail.  Health counseling performed.  All questions answered.  Lifestyle & Preventative Health Maintenance - Advised patient to continue exercising 50 minutes daily to maintain and improve his overall mental, physical, and emotional health.    - Recommended that the patient  eventually strive for at least 150 minutes of moderate cardiovascular activity per week according to guidelines established by [facility].   - Healthy dietary habits encouraged, including low-carb, and high amounts of lean protein in diet.   - Patient should also consume adequate amounts of water - half of body weight in oz of water per day.  6. Follow-Up - Blood work drawn today.     Education and routine counseling performed. Handouts provided.   Orders Placed This Encounter  Procedures  . POCT glycosylated hemoglobin (Hb A1C)  . POCT UA - Microalbumin    Meds ordered this encounter  Medications  . zolpidem (AMBIEN) 10 MG tablet    Sig: Take 1 tablet (10 mg total) by mouth at bedtime as needed for sleep.    Dispense:  90 tablet    Refill:  1    Return for 4-103mo - DM f/up; sooner as needed.   The patient was counseled, risk factors were discussed, anticipatory guidance given.  Gross side effects, risk and benefits, and alternatives of medications discussed with patient.  Patient is aware that all medications have potential side effects and we are unable to predict every side effect or drug-drug interaction that may occur.  Expresses verbal understanding and consents to current therapy plan and treatment regimen.  Please see AVS handed out to patient at the end of our visit for further patient instructions/ counseling done pertaining to today's office visit.    Note: This document was prepared using Dragon voice recognition software and may include unintentional dictation errors.  This document serves as a record of services personally  performed by Mellody Dance, DO. It was created on her behalf by [name], a trained medical scribe. The creation of this record is based on the scribe's personal observations and the provider's statements to them.   I have reviewed the above medical documentation for accuracy and completeness and I concur.  Mellody Dance 05/03/18 5:34 PM     Subjective:    Chief Complaint  Patient presents with  . Follow-up     DAVAUGHN HILLYARD is a 52 y.o. male who presents to [place] at [facility] today for Diabetes Management.    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  Patient continues biofeedback therapy, "re-training the body."  His goal is to be off of medicines altogether, including discontinuing use of Ambien and tramadol.  Patient feels that meditation is a helpful aspect to him.  Insomnia Patient feels that sleep is his most important aspect of his health.  He continues to be managed successfully on tramadol and Ambien; however, he wishes to explore other alternatives.  Exercise Patient does 20 minutes of swimming per day, and fast-walking for 30 minutes per day.  He obtains 50 minutes of cardiovascular activity per day.  Denies issues with diet or eating.  Notes he does the best he can; tries to avoid sugars.  DM HPI: -  He has been working on diet and exercise for diabetes.  Continues to control his diabetes with his diet, exercise, and biofeedback therapy.  Pt is currently maintained on diet and biofeedback for diabetes.  Patient feels that his A1c should have improved for today.  His goal is to achieve between 5.9 and 6.1.  Home glucose readings range - patient states he tries to stay in 5.9-6.1 range A1c.   Denies polyuria/polydipsia. Denies hypo/ hyperglycemia symptoms - He denies new onset of: chest pain, exercise intolerance, shortness of breath, dizziness, visual changes, headache, lower extremity swelling or claudication.   Last diabetic eye exam was No results found for: HMDIABEYEEXA  Foot exam- UTD  Last A1C in the office was:  Lab Results  Component Value Date   HGBA1C 6.2 05/03/2018   HGBA1C 6.4 (H) 01/01/2018   HGBA1C 6.1 07/15/2017    Lab Results  Component Value Date   MICROALBUR 30  05/03/2018   LDLCALC 139 (H) 01/01/2018   CREATININE 0.73 (L) 01/01/2018     Last 3 blood pressure readings in our office are as follows: BP Readings from Last 3 Encounters:  05/03/18 121/80  01/01/18 118/76  07/15/17 128/68    BMI Readings from Last 3 Encounters:  05/03/18 23.40 kg/m  02/16/18 24.44 kg/m  01/01/18 25.59 kg/m     No problems updated.    Patient Care Team    Relationship Specialty Notifications Start End  Mellody Dance, DO PCP - General Family Medicine  11/05/16   Carlyle Dolly, MD (Inactive) Resident Family Medicine All results, Admissions 10/03/15   [name], Kevan Ny, MD Consulting Physician Neurosurgery  11/05/16   Marlaine Hind, MD Consulting Physician Physical Medicine and Rehabilitation  11/05/16    Comment: pain Doc  [place], [name], OD  Optometry  05/03/18      Patient Active Problem List   Diagnosis Date Noted  . Low HDL (under 40) 11/05/2016    Priority: High  . Diabetes mellitus, stable ([place]) 11/05/2016    Priority: High  . Status post colonoscopy 2008 11/05/2016    Priority: High  .  Insomnia 09/28/2015    Priority: Medium  . Chronic pain associated with significant psychosocial dysfunction 09/05/2015    Priority: Medium  . Status post lumbar spinal fusion 11/05/2016    Priority: Low  . Not currently working due to disabled status 11/05/2016    Priority: Low  . Status post cervical spinal fusion 11/05/2016    Priority: Low  . Long-term use of high-risk medication 11/05/2016    Priority: Low  . Muscle spasms of neck, upper back & lower back 11/05/2016    Priority: Low  . Vitamin D insufficiency 01/01/2018  . Hyperlipidemia associated with type 2 diabetes mellitus ([place]) 01/01/2018  . Low testosterone level in male 01/01/2018  . Osteoarthritis of spine with radiculopathy, cervical region 09/01/2016  . DDD (degenerative disc disease), lumbar 09/28/2015  . DDD (degenerative disc disease), cervical 09/05/2015  . Chronic  pain syndrome 09/05/2015  . Cervical stenosis of spine 09/05/2015  . Cervical pain (neck) 08/07/2015     Past Medical History:  Diagnosis Date  . Anxiety    uses valium for anxiety related to pain.   . Arthritis    stenosis, cervical area, arthritis  - spine   . Diabetes mellitus without complication ([place])    treated with diet only  . History of exercise stress test     in [place] 20 yrs[place] ago had stress test & he was followed by cardiologist for a couple yrs. , but hasn't been referred since he has lived here.   Marland Kitchen History of kidney stones    found incidentally - no problems, just stable   . Hyperlipidemia    borderline no meds  . Sleep concern    study done in [place], told that it was normal, sleep issue related to pain.       Past Surgical History:  Procedure Laterality Date  . ANTERIOR CERVICAL DECOMP/DISCECTOMY FUSION N/A 09/01/2016   Procedure: Cervical five-six, Cervical six-seven ANTERIOR CERVICAL DECOMPRESSION/DISCECTOMY FUSION;  Surgeon: Kevan Ny [name], MD;  Location: [facility];  Service: Neurosurgery;  Laterality: N/A;  . APPENDECTOMY  1984  . BACK SURGERY  2012    L5-S1 fusion  . BACK SURGERY  2016   C6-7 Decompression. [facility])  . COLONOSCOPY       Family History  Problem Relation Age of Onset  . Diabetes Mother   . Diabetes Brother   . Diabetes Sister   . Diabetes Brother   . Colon cancer Neg Hx   . Colon polyps Neg Hx   . Esophageal cancer Neg Hx   . Rectal cancer Neg Hx   . Stomach cancer Neg Hx      Social History   Substance and Sexual Activity  Drug Use No  ,  Social History   Substance and Sexual Activity  Alcohol Use Yes  . Alcohol/week: 0.0 oz   Comment: weekly  ,  Social History   Tobacco Use  Smoking Status Never Smoker  Smokeless Tobacco Never Used  ,    Current Outpatient Medications on File Prior to Visit  Medication Sig Dispense Refill  . nortriptyline (PAMELOR) 10 MG capsule Take 20 mg by mouth  at bedtime.     . traMADol (ULTRAM) 50 MG tablet Take by mouth 3 (three) times daily.    . TURMERIC PO Take 1 tablet by mouth daily.     No current facility-administered medications on file prior to visit.      Allergies  Allergen Reactions  . Trazodone  And Nefazodone Other (See Comments)    Passes out     Review of Systems:   General:  Denies fever, chills Optho/Auditory:   Denies visual changes, blurred vision Respiratory:   Denies SOB, cough, wheeze, DIB  Cardiovascular:   Denies chest pain, palpitations, painful respirations Gastrointestinal:   Denies nausea, vomiting, diarrhea.  Endocrine:     Denies new hot or cold intolerance Musculoskeletal:  Denies joint swelling, gait issues, or new unexplained myalgias/ arthralgias Skin:  Denies rash, suspicious lesions  Neurological:    Denies dizziness, unexplained weakness, numbness  Psychiatric/Behavioral:   Denies mood changes    Objective:     Blood pressure 121/80, pulse 85, height 5\' 6"  (1.676 m), weight 145 lb (65.8 kg), SpO2 99 %.  Body mass index is 23.4 kg/m.  General: Well Developed, well nourished, and in no acute distress.  HEENT: Normocephalic, atraumatic, pupils equal round reactive to light, neck supple, No carotid bruits, no JVD Skin: Warm and dry, cap RF less 2 sec Cardiac: Regular rate and rhythm, S1, S2 WNL's, no murmurs rubs or gallops Respiratory: ECTA B/L, Not using accessory muscles, speaking in full sentences. NeuroM-Sk: Ambulates w/o assistance, moves ext * 4 w/o difficulty, sensation grossly intact.  Ext: scant edema b/l lower ext Psych: No HI/SI, judgement and insight good, Euthymic mood. Full Affect.

## 2018-05-04 ENCOUNTER — Encounter: Payer: Managed Care, Other (non HMO) | Admitting: Gastroenterology

## 2018-05-05 ENCOUNTER — Emergency Department (HOSPITAL_COMMUNITY): Payer: Managed Care, Other (non HMO)

## 2018-05-05 ENCOUNTER — Other Ambulatory Visit: Payer: Self-pay

## 2018-05-05 ENCOUNTER — Emergency Department (HOSPITAL_COMMUNITY)
Admission: EM | Admit: 2018-05-05 | Discharge: 2018-05-05 | Disposition: A | Discharge status: Home / Self Care | Payer: Managed Care, Other (non HMO) | Attending: Emergency Medicine | Admitting: Emergency Medicine

## 2018-05-05 ENCOUNTER — Encounter (HOSPITAL_COMMUNITY): Payer: Self-pay | Admitting: Emergency Medicine

## 2018-05-05 DIAGNOSIS — E119 Type 2 diabetes mellitus without complications: Secondary | ICD-10-CM | POA: Diagnosis not present

## 2018-05-05 DIAGNOSIS — Z79899 Other long term (current) drug therapy: Secondary | ICD-10-CM | POA: Diagnosis not present

## 2018-05-05 DIAGNOSIS — E785 Hyperlipidemia, unspecified: Secondary | ICD-10-CM | POA: Insufficient documentation

## 2018-05-05 DIAGNOSIS — R072 Precordial pain: Secondary | ICD-10-CM | POA: Diagnosis not present

## 2018-05-05 DIAGNOSIS — R079 Chest pain, unspecified: Secondary | ICD-10-CM | POA: Diagnosis present

## 2018-05-05 LAB — CBC
HCT: 37.6 % — ABNORMAL LOW (ref 39.0–52.0)
Hemoglobin: 12.6 g/dL — ABNORMAL LOW (ref 13.0–17.0)
MCH: 29 pg (ref 26.0–34.0)
MCHC: 33.5 g/dL (ref 30.0–36.0)
MCV: 86.6 fL (ref 78.0–100.0)
Platelets: 253 10*3/uL (ref 150–400)
RBC: 4.34 MIL/uL (ref 4.22–5.81)
RDW: 12.3 % (ref 11.5–15.5)
WBC: 7.5 10*3/uL (ref 4.0–10.5)

## 2018-05-05 LAB — BASIC METABOLIC PANEL
Anion gap: 8 (ref 5–15)
BUN: 22 mg/dL — ABNORMAL HIGH (ref 6–20)
CO2: 26 mmol/L (ref 22–32)
Calcium: 9.7 mg/dL (ref 8.9–10.3)
Chloride: 106 mmol/L (ref 98–111)
Creatinine, Ser: 0.79 mg/dL (ref 0.61–1.24)
GFR calc Af Amer: >60 mL/min (ref >60)
GFR calc non Af Amer: >60 mL/min (ref >60)
Glucose, Bld: 134 mg/dL — ABNORMAL HIGH (ref 70–99)
Potassium: 3.7 mmol/L (ref 3.5–5.1)
Sodium: 140 mmol/L (ref 135–145)

## 2018-05-05 LAB — I-STAT TROPONIN, ED
Troponin i, poc: 0 ng/mL (ref 0.00–0.08)
Troponin i, poc: 0.01 ng/mL (ref 0.00–0.08)

## 2018-05-05 LAB — D-DIMER, QUANTITATIVE: D-Dimer, Quant: <0.27 ug/mL-FEU (ref 0.00–0.50)

## 2018-05-05 NOTE — Discharge Instructions (Addendum)

## 2018-05-05 NOTE — ED Triage Notes (Signed)
Pt presents with 2-3 day CP with radiation to back and L arm; pt states pain to back has resolved but still having chest tightness, pressure; pt denies n/v diaphoresis; also concerned about pain to L calf;

## 2018-05-05 NOTE — ED Provider Notes (Signed)
[facility] EMERGENCY DEPARTMENT Provider Note   CSN: [id number] Arrival date & time: 05/05/18  0027     History   Chief Complaint Chief Complaint  Patient presents with  . Chest Pain    HPI Richard Noble is a 52 y.o. male.  The history is provided by the patient.  Chest Pain   This is a new problem. The current episode started more than 2 days ago. The problem occurs daily. The problem has not changed since onset.The pain is present in the substernal region. The pain is moderate. The pain does not radiate. Associated symptoms include leg pain. Pertinent negatives include no abdominal pain, no back pain, no diaphoresis, no fever, no hemoptysis, no lower extremity edema, no nausea, no shortness of breath, no syncope and no vomiting. He has tried nothing for the symptoms.  Pertinent negatives for family medical history include: no CAD.   Patient reports he has had chest pain for 3 days.  It is unchanged for 3 days.  It does not radiate.  He also reports associated pain in his left bicep.  It does not radiate from his chest into his arm it is a separate pain.  He denies any back pain on my exam.  He denies nausea/vomiting/diaphoresis.  Denies shortness of breath.  He also mentions left calf pain of unclear etiology.  No recent travel.  Denies history of CAD/PE.  Pt With history of diet-controlled diabetes.  He reports he works out frequently, he swims frequently without any limitations. Past Medical History:  Diagnosis Date  . Anxiety    uses valium for anxiety related to pain.   . Arthritis    stenosis, cervical area, arthritis  - spine   . Diabetes mellitus without complication ([place])    treated with diet only  . History of exercise stress test     in [place] 20 yrs[place] ago had stress test & he was followed by cardiologist for a couple yrs. , but hasn't been referred since he has lived here.   Marland Kitchen History of kidney stones    found incidentally - no problems, just  stable   . Hyperlipidemia    borderline no meds  . Sleep concern    study done in [place], told that it was normal, sleep issue related to pain.      Patient Active Problem List   Diagnosis Date Noted  . Vitamin D insufficiency 01/01/2018  . Hyperlipidemia associated with type 2 diabetes mellitus ([place]) 01/01/2018  . Low testosterone level in male 01/01/2018  . Low HDL (under 40) 11/05/2016  . Diabetes mellitus, stable ([place]) 11/05/2016  . Status post colonoscopy 2008 11/05/2016  . Status post lumbar spinal fusion 11/05/2016  . Not currently working due to disabled status 11/05/2016  . Status post cervical spinal fusion 11/05/2016  . Long-term use of high-risk medication 11/05/2016  . Muscle spasms of neck, upper back & lower back 11/05/2016  . Osteoarthritis of spine with radiculopathy, cervical region 09/01/2016  . DDD (degenerative disc disease), lumbar 09/28/2015  . Insomnia 09/28/2015  . DDD (degenerative disc disease), cervical 09/05/2015  . Chronic pain associated with significant psychosocial dysfunction 09/05/2015  . Chronic pain syndrome 09/05/2015  . Cervical stenosis of spine 09/05/2015  . Cervical pain (neck) 08/07/2015    Past Surgical History:  Procedure Laterality Date  . ANTERIOR CERVICAL DECOMP/DISCECTOMY FUSION N/A 09/01/2016   Procedure: Cervical five-six, Cervical six-seven ANTERIOR CERVICAL DECOMPRESSION/DISCECTOMY FUSION;  Surgeon: Kevan Ny [name], MD;  Location: [facility];  Service: Neurosurgery;  Laterality: N/A;  . APPENDECTOMY  1984  . BACK SURGERY  2012    L5-S1 fusion  . BACK SURGERY  2016   C6-7 Decompression. [facility])  . COLONOSCOPY          Home Medications    Prior to Admission medications   Medication Sig Start Date End Date Taking? Authorizing Provider  nortriptyline (PAMELOR) 10 MG capsule Take 20 mg by mouth at bedtime.     [provider]  traMADol (ULTRAM) 50 MG tablet Take by mouth 3 (three) times  daily.    [provider]  TURMERIC PO Take 1 tablet by mouth daily.    [provider]  zolpidem (AMBIEN) 10 MG tablet Take 1 tablet (10 mg total) by mouth at bedtime as needed for sleep. 05/03/18   Mellody Dance, DO    Family History Family History  Problem Relation Age of Onset  . Diabetes Mother   . Diabetes Brother   . Diabetes Sister   . Diabetes Brother   . Colon cancer Neg Hx   . Colon polyps Neg Hx   . Esophageal cancer Neg Hx   . Rectal cancer Neg Hx   . Stomach cancer Neg Hx     Social History Social History   Tobacco Use  . Smoking status: Never Smoker  . Smokeless tobacco: Never Used  Substance Use Topics  . Alcohol use: Yes    Alcohol/week: 0.0 oz    Comment: weekly  . Drug use: No     Allergies   Trazodone and nefazodone   Review of Systems Review of Systems  Constitutional: Negative for diaphoresis and fever.  Respiratory: Negative for hemoptysis and shortness of breath.   Cardiovascular: Positive for chest pain. Negative for syncope.  Gastrointestinal: Negative for abdominal pain, nausea and vomiting.  Musculoskeletal: Negative for back pain.  Neurological: Negative for syncope.  All other systems reviewed and are negative.    Physical Exam Updated Vital Signs BP 129/88 (BP Location: Right Arm)   Pulse 71   Temp 97.8 F (36.6 C) (Oral)   Resp 16   SpO2 100%   Physical Exam  CONSTITUTIONAL: Well developed/well nourished HEAD: Normocephalic/atraumatic EYES: EOMI/PERRL ENMT: Mucous membranes moist NECK: supple no meningeal signs SPINE/BACK:entire spine nontender CV: S1/S2 noted, no murmurs/rubs/gallops noted LUNGS: Lungs are clear to auscultation bilaterally, no apparent distress Chest -No focal tenderness of bruising. ABDOMEN: soft, nontender, no rebound or guarding, bowel sounds noted throughout abdomen GU:no cva tenderness NEURO: Pt is awake/alert/appropriate, moves all extremitiesx4.  No facial droop.     EXTREMITIES: pulses normal/equal, full ROM, mild left calf tenderness, no edema, no erythema, distal pulses intact Mild tenderness palpation left AC fossa, no bruising, no track marks, no signs of cellulitis.  Full range of motion left elbow without difficulty. SKIN: warm, color normal PSYCH: no abnormalities of mood noted, alert and oriented to situation  ED Treatments / Results  Labs (all labs ordered are listed, but only abnormal results are displayed) Labs Reviewed  BASIC METABOLIC PANEL - Abnormal; Notable for the following components:      Result Value   Glucose, Bld 134 (*)    BUN 22 (*)    All other components within normal limits  CBC - Abnormal; Notable for the following components:   Hemoglobin 12.6 (*)    HCT 37.6 (*)    All other components within normal limits  D-DIMER, QUANTITATIVE (NOT AT [facility])  I-STAT TROPONIN, ED  I-STAT TROPONIN, ED    EKG EKG Interpretation  Date/Time:  Wednesday May 05 2018 00:34:05 EDT Ventricular Rate:  73 PR Interval:  166 QRS Duration: 92 QT Interval:  362 QTC Calculation: 398 R Axis:   67 Text Interpretation:  Normal sinus rhythm Normal ECG No previous ECGs available Confirmed by Ripley Fraise [phone]) on 05/05/2018 4:14:35 AM   Radiology Dg Chest 2 View  Result Date: 05/05/2018 CLINICAL DATA:  Chest pain EXAM: CHEST - 2 VIEW COMPARISON:  None. FINDINGS: The heart size and mediastinal contours are within normal limits. Both lungs are clear. The visualized skeletal structures are unremarkable. IMPRESSION: No active cardiopulmonary disease. Electronically Signed   By: Ulyses Jarred M.D.   On: 05/05/2018 01:05    Procedures Procedures   Medications Ordered in ED Medications - No data to display   Initial Impression / Assessment and Plan / ED Course  I have reviewed the triage vital signs and the nursing notes.  Pertinent labs & imaging results that were available during my care of the patient were reviewed by me and  considered in my medical decision making (see chart for details).     Patient well-appearing, and is very low risk for ACS.  2 troponins are negative heart score is 2.  D-dimer negative.  No signs of DVT on exam.  I feel he is appropriate for discharge home.  Discussed strict ER return precautions.  Final Clinical Impressions(s) / ED Diagnoses   Final diagnoses:  Precordial pain    ED Discharge Orders    None       Ripley Fraise, MD 05/05/18 [phone]

## 2018-05-06 ENCOUNTER — Encounter: Payer: Self-pay | Admitting: Family Medicine

## 2018-08-09 ENCOUNTER — Ambulatory Visit: Payer: Managed Care, Other (non HMO) | Admitting: Family Medicine

## 2018-08-12 ENCOUNTER — Encounter: Payer: Self-pay | Admitting: Family Medicine

## 2018-08-12 ENCOUNTER — Ambulatory Visit (INDEPENDENT_AMBULATORY_CARE_PROVIDER_SITE_OTHER): Payer: Managed Care, Other (non HMO) | Admitting: Family Medicine

## 2018-08-12 VITALS — BP 109/72 | HR 73 | Ht 66.0 in | Wt 142.0 lb

## 2018-08-12 DIAGNOSIS — Z23 Encounter for immunization: Secondary | ICD-10-CM | POA: Diagnosis not present

## 2018-08-12 DIAGNOSIS — E860 Dehydration: Secondary | ICD-10-CM | POA: Diagnosis not present

## 2018-08-12 DIAGNOSIS — G894 Chronic pain syndrome: Secondary | ICD-10-CM | POA: Diagnosis not present

## 2018-08-12 DIAGNOSIS — R42 Dizziness and giddiness: Secondary | ICD-10-CM | POA: Insufficient documentation

## 2018-08-12 DIAGNOSIS — I951 Orthostatic hypotension: Secondary | ICD-10-CM | POA: Diagnosis not present

## 2018-08-12 DIAGNOSIS — M62838 Other muscle spasm: Secondary | ICD-10-CM

## 2018-08-12 NOTE — Progress Notes (Signed)
Impression and Recommendations:    1. Orthostatic hypotension   2. Dehydration, mild   3. Dizziness   4. Chronic pain associated with significant psychosocial dysfunction   5. Muscle spasms of neck   6. Flu vaccine need      Orthostatic hypotension  Dehydration, mild  Dizziness  Chronic pain associated with significant psychosocial dysfunction  Muscle spasms of neck  Flu vaccine need - Plan: Flu Vaccine QUAD 6+ mos PF IM (Fluarix Quad PF)  1. Orthostatic hypotension Discussed adequate hydration status of one half his weight in ounces water per day as well as making up for any caffeinated beverages. -Explained having at greater than 20 point drop when changing position is consistent with orthostatic hypotension.  2. Dehydration, mild Counseling done.  3. Dizziness - Likely due to #1 and #2.   - Also patient gets dizzy when his neck pain gets worse and he will discuss this with his chronic pain doctor in the very near future when he sees him in 2 weeks  4. Chronic pain associated with significant psychosocial dysfunction He has follow-up with his chronic pain doc  5. Muscle spasms of neck Patient declined any muscle relaxers. -He will apply heat to the area as well as exercise to increase blood flow which he has not been exercising nearly as much as usual lately. -We did discuss importance of exercise on his psychosocial dysfunction as well as chronic pain and muscle spasms. -He promises to get back to the gym.      Orders Placed This Encounter  Procedures  . Flu Vaccine QUAD 6+ mos PF IM (Fluarix Quad PF)    Gross side effects, risk and benefits, and alternatives of medications and treatment plan in general discussed with patient.  Patient is aware that all medications have potential side effects and we are unable to predict every side effect or drug-drug interaction that may occur.   Patient will call with any questions prior to using medication if they  have concerns.    Expresses verbal understanding and consents to current therapy and treatment regimen.  No barriers to understanding were identified.  Red flag symptoms and signs discussed in detail.  Patient expressed understanding regarding what to do in case of emergency\urgent symptoms  Please see AVS handed out to patient at the end of our visit for further patient instructions/ counseling done pertaining to today's office visit.   Return if symptoms worsen or fail to improve, for Follow-up chronic care as previously discussed..     Note:  This note was prepared with assistance of Dragon voice recognition software. Occasional wrong-word or sound-a-like substitutions may have occurred due to the inherent limitations of voice recognition software.      --------------------------------------------------------------------------------------------------------------------------------------------------------------------------------------------------------------------------------------------    Subjective:     HPI: Richard Noble is a 52 y.o. male who presents to [place] [facility] today for issues as discussed below.   Patient's headaches and dizziness with bending over occurred greater than 3 months ago.  It was happening occasionally then now is increased in frequency almost every time he bends over he feels lightheaded and like the room goes black on him for second.  No room spinning.  He denies any upper respiratory symptoms or fullness in his ears, sinus congestion etc.  He denies any other fever chills or ear pain etc.  He has increasing neck pain for the past couple of months especially since he has stopped going to  the gym.  Patient states he has increased in headaches and dizziness when his neck pain increases which is nothing new for him.  These of the same symptoms he normally has.  He denies any heart palpitations, any visual changes other than stated  above, no onset of new headaches, no new neurological symptoms, no muscular dysfunction etc.    Wt Readings from Last 3 Encounters:  08/12/18 142 lb (64.4 kg)  05/03/18 145 lb (65.8 kg)  02/16/18 151 lb 6.4 oz (68.7 kg)   BP Readings from Last 3 Encounters:  08/12/18 109/72  05/05/18 116/82  05/03/18 121/80   Pulse Readings from Last 3 Encounters:  08/12/18 73  05/05/18 74  05/03/18 85   BMI Readings from Last 3 Encounters:  08/12/18 22.92 kg/m  05/03/18 23.40 kg/m  02/16/18 24.44 kg/m     Patient Care Team    Relationship Specialty Notifications Start End  Mellody Dance, DO PCP - General Family Medicine  11/05/16   Carlyle Dolly, MD Resident Family Medicine All results, Admissions 10/03/15   [name], Kevan Ny, MD Consulting Physician Neurosurgery  11/05/16   Marlaine Hind, MD Consulting Physician Physical Medicine and Rehabilitation  11/05/16    Comment: pain Doc  [place], [name], [place]  Optometry  05/03/18      Patient Active Problem List   Diagnosis Date Noted  . Low HDL (under 40) 11/05/2016    Priority: High  . Diabetes mellitus, stable ([place]) 11/05/2016    Priority: High  . Status post colonoscopy 2008 11/05/2016    Priority: High  . Insomnia 09/28/2015    Priority: Medium  . Chronic pain associated with significant psychosocial dysfunction 09/05/2015    Priority: Medium  . Status post lumbar spinal fusion 11/05/2016    Priority: Low  . Not currently working due to disabled status 11/05/2016    Priority: Low  . Status post cervical spinal fusion 11/05/2016    Priority: Low  . Long-term use of high-risk medication 11/05/2016    Priority: Low  . Muscle spasms of neck, upper back & lower back 11/05/2016    Priority: Low  . Dizziness 08/12/2018  . Orthostatic hypotension 08/12/2018  . Dehydration, mild 08/12/2018  . Vitamin D insufficiency 01/01/2018  . Hyperlipidemia associated with type 2 diabetes mellitus ([place]) 01/01/2018  . Low  testosterone level in male 01/01/2018  . Osteoarthritis of spine with radiculopathy, cervical region 09/01/2016  . DDD (degenerative disc disease), lumbar 09/28/2015  . DDD (degenerative disc disease), cervical 09/05/2015  . Chronic pain syndrome 09/05/2015  . Cervical stenosis of spine 09/05/2015  . Cervical pain (neck) 08/07/2015    Past Medical history, Surgical history, Family history, Social history, Allergies and Medications have been entered into the medical record, reviewed and changed as needed.    Current Meds  Medication Sig  . nortriptyline (PAMELOR) 10 MG capsule Take 20 mg by mouth at bedtime.   . traMADol (ULTRAM) 50 MG tablet Take by mouth 3 (three) times daily.  . TURMERIC PO Take 1 tablet by mouth daily.  Marland Kitchen zolpidem (AMBIEN) 10 MG tablet Take 1 tablet (10 mg total) by mouth at bedtime as needed for sleep.    Allergies:  Allergies  Allergen Reactions  . Trazodone And Nefazodone Other (See Comments)    Passes out     Review of Systems:  A fourteen system review of systems was performed and found to be positive as per HPI.   Objective:   Blood pressure 109/72, pulse 73,  height 5\' 6"  (1.676 m), weight 142 lb (64.4 kg), SpO2 98 %. Body mass index is 22.92 kg/m. General:  Well Developed, well nourished, appropriate for stated age.  Neuro:  Alert and oriented,  extra-ocular muscles intact  HEENT:  Normocephalic, atraumatic, neck supple, no carotid bruits appreciated  Skin:  no gross rash, warm, pink. Cardiac:  RRR, S1 S2 Respiratory:  ECTA B/L and A/P, Not using accessory muscles, speaking in full sentences- unlabored. Vascular:  Ext warm, no cyanosis apprec.; cap RF less 2 sec. Psych:  No HI/SI, judgement and insight good, Euthymic mood. Full Affect.

## 2018-08-12 NOTE — Patient Instructions (Signed)
Please get back to the gym and do at least an hour every day like you were before.  Avoid any heavy lifting or anything is going to make your muscles more tense at least until things calm down.  Also please drink one half your weight in ounces of water per day and make up for any caffeinated beverages you are drinking by drinking equal amounts of water to negate that intake.  Also, please move slowly from a bending over to a standing position until you are better hydrated and your neck muscle spasms have come down.  Please let your chronic pain management doctor who manages your chronic neck pain know about what we discussed and also let me know if he suggests anything else additionally.   Orthostatic Hypotension Orthostatic hypotension is a sudden drop in blood pressure that happens when you quickly change positions, such as when you get up from a seated or lying position. Blood pressure is a measurement of how strongly, or weakly, your blood is pressing against the walls of your arteries. Arteries are blood vessels that carry blood from your heart throughout your body. When blood pressure is too low, you may not get enough blood to your brain or to the rest of your organs. This can cause weakness, light-headedness, rapid heartbeat, and fainting. This can last for just a few seconds or for up to a few minutes. Orthostatic hypotension is usually not a serious problem. However, if it happens frequently or gets worse, it may be a sign of something more serious. What are the causes? This condition may be caused by:  Sudden changes in posture, such as standing up quickly after you have been sitting or lying down.  Blood loss.  Loss of body fluids (dehydration).  Heart problems.  Hormone (endocrine) problems.  Pregnancy.  Severe infection.  Lack of certain nutrients.  Severe allergic reactions (anaphylaxis).  Certain medicines, such as blood pressure medicine or medicines that make the  body lose excess fluids (diuretics). Sometimes, this condition can be caused by not taking medicine as directed, such as taking too much of a certain medicine.  What increases the risk? Certain factors can make you more likely to develop orthostatic hypotension, including:  Age. Risk increases as you get older.  Conditions that affect the heart or the central nervous system.  Taking certain medicines, such as blood pressure medicine or diuretics.  Being pregnant.  What are the signs or symptoms? Symptoms of this condition may include:  Weakness.  Light-headedness.  Dizziness.  Blurred vision.  Fatigue.  Rapid heartbeat.  Fainting, in severe cases.  How is this diagnosed? This condition is diagnosed based on:  Your medical history.  Your symptoms.  Your blood pressure measurement. Your health care provider will check your blood pressure when you are: ? Lying down. ? Sitting. ? Standing.  A blood pressure reading is recorded as two numbers, such as "120 over 80" (or 120/80). The first ("top") number is called the systolic pressure. It is a measure of the pressure in your arteries as your heart beats. The second ("bottom") number is called the diastolic pressure. It is a measure of the pressure in your arteries when your heart relaxes between beats. Blood pressure is measured in a unit called mm Hg. Healthy blood pressure for adults is 120/80. If your blood pressure is below 90/60, you may be diagnosed with hypotension. Other information or tests that may be used to diagnose orthostatic hypotension include:  Your other vital  signs, such as your heart rate and temperature.  Blood tests.  Tilt table test. For this test, you will be safely secured to a table that moves you from a lying position to an upright position. Your heart rhythm and blood pressure will be monitored during the test.  How is this treated? Treatment for this condition may include:  Changing your  diet. This may involve eating more salt (sodium) or drinking more water.  Taking medicines to raise your blood pressure.  Changing the dosage of certain medicines you are taking that might be lowering your blood pressure.  Wearing compression stockings. These stockings help to prevent blood clots and reduce swelling in your legs.  In some cases, you may need to go to the hospital for:  Fluid replacement. This means you will receive fluids through an IV tube.  Blood replacement. This means you will receive donated blood through an IV tube (transfusion).  Treating an infection or heart problems, if this applies.  Monitoring. You may need to be monitored while medicines that you are taking wear off.  Follow these instructions at home: Eating and drinking   Drink enough fluid to keep your urine clear or pale yellow.  Eat a healthy diet and follow instructions from your health care provider about eating or drinking restrictions. A healthy diet includes: ? Fresh fruits and vegetables. ? Whole grains. ? Lean meats. ? Low-fat dairy products.  Eat extra salt only as directed. Do not add extra salt to your diet unless your health care provider told you to do that.  Eat frequent, small meals.  Avoid standing up suddenly after eating. Medicines  Take over-the-counter and prescription medicines only as told by your health care provider. ? Follow instructions from your health care provider about changing the dosage of your current medicines, if this applies. ? Do not stop or adjust any of your medicines on your own. General instructions  Wear compression stockings as told by your health care provider.  Get up slowly from lying down or sitting positions. This gives your blood pressure a chance to adjust.  Avoid hot showers and excessive heat as directed by your health care provider.  Return to your normal activities as told by your health care provider. Ask your health care provider  what activities are safe for you.  Do not use any products that contain nicotine or tobacco, such as cigarettes and e-cigarettes. If you need help quitting, ask your health care provider.  Keep all follow-up visits as told by your health care provider. This is important. Contact a health care provider if:  You vomit.  You have diarrhea.  You have a fever for more than 2-3 days.  You feel more thirsty than usual.  You feel weak and tired. Get help right away if:  You have chest pain.  You have a fast or irregular heartbeat.  You develop numbness in any part of your body.  You cannot move your arms or your legs.  You have trouble speaking.  You become sweaty or feel lightheaded.  You faint.  You feel short of breath.  You have trouble staying awake.  You feel confused. This information is not intended to replace advice given to you by your health care provider. Make sure you discuss any questions you have with your health care provider. Document Released: 09/19/2002 Document Revised: 06/17/2016 Document Reviewed: 03/21/2016 Elsevier Interactive Patient Education  2018 [employer].

## 2018-08-16 ENCOUNTER — Ambulatory Visit: Payer: Managed Care, Other (non HMO) | Admitting: Family Medicine

## 2018-11-03 ENCOUNTER — Ambulatory Visit: Payer: Managed Care, Other (non HMO) | Admitting: Family Medicine

## 2018-11-03 ENCOUNTER — Encounter: Payer: Self-pay | Admitting: Family Medicine

## 2018-11-03 VITALS — BP 114/78 | HR 88 | Temp 98.3°F | Ht 66.0 in | Wt 150.0 lb

## 2018-11-03 DIAGNOSIS — Z23 Encounter for immunization: Secondary | ICD-10-CM | POA: Diagnosis not present

## 2018-11-03 DIAGNOSIS — G894 Chronic pain syndrome: Secondary | ICD-10-CM

## 2018-11-03 DIAGNOSIS — E785 Hyperlipidemia, unspecified: Secondary | ICD-10-CM

## 2018-11-03 DIAGNOSIS — E1169 Type 2 diabetes mellitus with other specified complication: Secondary | ICD-10-CM | POA: Diagnosis not present

## 2018-11-03 DIAGNOSIS — E786 Lipoprotein deficiency: Secondary | ICD-10-CM | POA: Diagnosis not present

## 2018-11-03 DIAGNOSIS — E119 Type 2 diabetes mellitus without complications: Secondary | ICD-10-CM

## 2018-11-03 LAB — POCT GLYCOSYLATED HEMOGLOBIN (HGB A1C): Hemoglobin A1C: 6.1 % — AB (ref 4.0–5.6)

## 2018-11-03 NOTE — Patient Instructions (Signed)

## 2018-11-03 NOTE — Progress Notes (Signed)
Impression and Recommendations:    1. Diet-controlled diabetes mellitus ([place])   2. Hyperlipidemia associated with type 2 diabetes mellitus ([place])   3. Low HDL (under 40)   4. Chronic pain associated with significant psychosocial dysfunction   5. Need for Tdap vaccination     - Need for lab work in near future.  1. Diet-controlled Diabetes, Stable - A1c is 6.1 today.  On 05/03/2018, A1c was 6.2.  - Counseled patient on pathophysiology of disease and discussed various treatment options, which often includes dietary and lifestyle modifications as first line.  Importance of low carb/ketogenic diet discussed with patient in addition to regular exercise.   - Check FBS and 2 hours after the biggest meal of your day.  Keep log and bring in next OV for my review.   Also, if you ever feel poorly, please check your blood pressure and blood sugar, as one or the other could be the cause of your symptoms.  - Being a diabetic, you need yearly eye and foot exams. Make appt.for diabetic eye exam.  Need for Diabetic Cholesterol Management - Discussed recommendations for all diabetics, including the need for chronic management on cholesterol medicine.  Extensive education provided about risk factors.  All questions were answered.   - Advised patient to re-check labs around March as scheduled.  If LDL is above 100 at that time, reviewed that medication will be indicated.  2. Orthostatic Hypotension - Stable at this time. - Continue management as recommended. - Will continue to monitor.  3. Chronic Pain Management - Back Pain, Follows up with Specialist - Encouraged patient to speak with pain medicine about the possibility of managing his pain on Lyrica, gabapentin, Neurontin, Elavil.  - Encouraged patient to transition to another avenue of chronic pain management, but retain access to the tramadol just in case the pain gets bad.  - Discussed importance of relaxation, meditation, and continuing  to seek prudent alternate means to manage his pain.  - Has an appointment coming up with Trish Mage of [facility]," recommended by his therapist.  Strongly advised patient to have his paperwork sent here to PCP from these visits.  - Reviewed and extensively discussed critical importance of hydration.  4. BMI Counseling - BMI of 24.21 Explained to patient what BMI refers to, and what it means medically.    Told patient to think about it as a "medical risk stratification measurement" and how increasing BMI is associated with increasing risk/ or worsening state of various diseases such as hypertension, hyperlipidemia, diabetes, premature OA, depression etc.  American Heart Association guidelines for healthy diet, basically Mediterranean diet, and exercise guidelines of 30 minutes 5 days per week or more discussed in detail.  Health counseling performed.  All questions answered.  5. Lifestyle & Preventative Health Maintenance - Advised patient to continue working toward exercising to improve overall mental, physical, and emotional health.    - Reviewed the "spokes of the wheel" of mood and health management.  Stressed the importance of ongoing prudent habits, including regular exercise, appropriate sleep hygiene, healthful dietary habits, and prayer/meditation to relax.  - Encouraged patient to engage in daily physical activity, especially a formal exercise routine.  Recommended that the patient eventually strive for at least 150 minutes of moderate cardiovascular activity per week according to guidelines established by the [facility].   - Healthy dietary habits encouraged, including low-carb, and high amounts of lean protein in diet.   - Patient should also consume  adequate amounts of water.   Education and routine counseling performed. Handouts provided.    Orders Placed This Encounter  Procedures  . Tdap vaccine greater than or equal to 7yo IM  . CBC with Differential/Platelet  .  Comprehensive metabolic panel  . Hemoglobin A1c  . Lipid panel  . T4, free  . TSH  . VITAMIN D 25 Hydroxy (Vit-D Deficiency, Fractures)  . POCT glycosylated hemoglobin (Hb A1C)    The patient was counseled, risk factors were discussed, anticipatory guidance given.  Gross side effects, risk and benefits, and alternatives of medications discussed with patient.  Patient is aware that all medications have potential side effects and we are unable to predict every side effect or drug-drug interaction that may occur.  Expresses verbal understanding and consents to current therapy plan and treatment regimen.   Return for diabetes follow up every 3-4 mo;  FBW 1 wk prior.   Please see AVS handed out to patient at the end of our visit for further patient instructions/ counseling done pertaining to today's office visit.    Note:  This document was prepared using Dragon voice recognition software and may include unintentional dictation errors.   This document serves as a record of services personally performed by Mellody Dance, DO. It was created on her behalf by [name], a trained medical scribe. The creation of this record is based on the scribe's personal observations and the provider's statements to them.   I have reviewed the above medical documentation for accuracy and completeness and I concur.  Mellody Dance, DO 11/07/2018 7:14 PM        Subjective:    Chief Complaint  Patient presents with  . Follow-up    Richard Noble is a 53 y.o. male who presents to [facility] today for Diabetes Management.  Taking his mother to a nutritionist today.  States that his past dizziness has resolved, he thinks it was due to dehydration.  Now drinks "as much as I can, all day long."  Felt dizziness once, and had aching in his calves, "but it's mostly after I'm on my feet for a while."    Is seeing a hypno-therapist for three sessions to help deal  with his chronic pain.  Sees his pain doctor on February 11th.  Says his aim is to get off of tramadol before June, due to his insurance.  DM HPI: -  He has been working on diet and exercise for diabetes.  Says "I eat everything, I just don't eat a ton of it."  Has resumed going to the gym, for five days per week.  Pt is currently maintained on the following medications for diabetes: diet-controlled. Medication compliance - Continues to be well-managed and diet-controlled.  Home glucose readings range: "Not as good as usual."  Feels it's ranged (post-prandial) from 90's to 118, a few times he got 140.  Confirms "on average, (post-prandials) under 120."  He doesn't usually take his blood sugars in the morning.   Denies polyuria/polydipsia. Denies hypo/ hyperglycemia symptoms - He denies new onset of: chest pain, exercise intolerance, shortness of breath, dizziness, visual changes, headache, lower extremity swelling or claudication.   Last diabetic eye exam was  Lab Results  Component Value Date   HMDIABEYEEXA No Retinopathy 04/07/2018    Foot exam- UTD  Last A1C in the office was:  Lab Results  Component Value Date   HGBA1C 6.1 (A) 11/03/2018   HGBA1C 6.2 05/03/2018  HGBA1C 6.4 (H) 01/01/2018    Lab Results  Component Value Date   MICROALBUR 30 05/03/2018   LDLCALC 139 (H) 01/01/2018   CREATININE 0.79 05/05/2018    1. Blood Pressure  -  His blood pressure has been controlled at home.  Pt is checking it at home.   - Patient reports good compliance with treatment plan.  - Denies medication S-E.   - Smoking Status noted   - He denies new onset of: chest pain, exercise intolerance, shortness of breath, dizziness, visual changes, headache, lower extremity swelling or claudication.   Last 3 blood pressure readings in our office are as follows: BP Readings from Last 3 Encounters:  11/03/18 114/78  08/12/18 109/72  05/05/18 116/82    Filed Weights   11/03/18 0902    Weight: 150 lb (68 kg)    Last 3 blood pressure readings in our office are as follows: BP Readings from Last 3 Encounters:  11/03/18 114/78  08/12/18 109/72  05/05/18 116/82    BMI Readings from Last 3 Encounters:  11/03/18 24.21 kg/m  08/12/18 22.92 kg/m  05/03/18 23.40 kg/m     No problems updated.    Patient Care Team    Relationship Specialty Notifications Start End  Mellody Dance, DO PCP - General Family Medicine  11/05/16   Carlyle Dolly, MD Resident Family Medicine All results, Admissions 10/03/15   [name], Kevan Ny, MD Consulting Physician Neurosurgery  11/05/16   Marlaine Hind, MD Consulting Physician Physical Medicine and Rehabilitation  11/05/16    Comment: pain Doc  [place], [name], OD  Optometry  05/03/18      Patient Active Problem List   Diagnosis Date Noted  . Low HDL (under 40) 11/05/2016    Priority: High  . Diet-controlled diabetes mellitus ([place]) 11/05/2016    Priority: High  . Status post colonoscopy 2008 11/05/2016    Priority: High  . Insomnia 09/28/2015    Priority: Medium  . Chronic pain associated with significant psychosocial dysfunction 09/05/2015    Priority: Medium  . Status post lumbar spinal fusion 11/05/2016    Priority: Low  . Not currently working due to disabled status 11/05/2016    Priority: Low  . Status post cervical spinal fusion 11/05/2016    Priority: Low  . Long-term use of high-risk medication 11/05/2016    Priority: Low  . Muscle spasms of neck, upper back & lower back 11/05/2016    Priority: Low  . Dizziness 08/12/2018  . Orthostatic hypotension 08/12/2018  . Dehydration, mild 08/12/2018  . Vitamin D insufficiency 01/01/2018  . Hyperlipidemia associated with type 2 diabetes mellitus ([place]) 01/01/2018  . Low testosterone level in male 01/01/2018  . Osteoarthritis of spine with radiculopathy, cervical region 09/01/2016  . DDD (degenerative disc disease), lumbar 09/28/2015  . DDD (degenerative disc  disease), cervical 09/05/2015  . Chronic pain syndrome 09/05/2015  . Cervical stenosis of spine 09/05/2015  . Cervical pain (neck) 08/07/2015     Past Medical History:  Diagnosis Date  . Anxiety    uses valium for anxiety related to pain.   . Arthritis    stenosis, cervical area, arthritis  - spine   . Diabetes mellitus without complication ([place])    treated with diet only  . History of exercise stress test     in [place] state 20 yrs. ago had stress test & he was followed by cardiologist for a couple yrs. , but hasn't been referred since he has lived here.   Marland Kitchen  History of kidney stones    found incidentally - no problems, just stable   . Hyperlipidemia    borderline no meds  . Sleep concern    study done in [place], told that it was normal, sleep issue related to pain.       Past Surgical History:  Procedure Laterality Date  . ANTERIOR CERVICAL DECOMP/DISCECTOMY FUSION N/A 09/01/2016   Procedure: Cervical five-six, Cervical six-seven ANTERIOR CERVICAL DECOMPRESSION/DISCECTOMY FUSION;  Surgeon: Kevan Ny [name], MD;  Location: [place];  Service: Neurosurgery;  Laterality: N/A;  . APPENDECTOMY  1984  . BACK SURGERY  2012    L5-S1 fusion  . BACK SURGERY  2016   C6-7 Decompression. [facility])  . COLONOSCOPY       Family History  Problem Relation Age of Onset  . Diabetes Mother   . Diabetes Brother   . Diabetes Sister   . Diabetes Brother   . Colon cancer Neg Hx   . Colon polyps Neg Hx   . Esophageal cancer Neg Hx   . Rectal cancer Neg Hx   . Stomach cancer Neg Hx      Social History   Substance and Sexual Activity  Drug Use No  ,  Social History   Substance and Sexual Activity  Alcohol Use Yes  . Alcohol/week: 0.0 standard drinks   Comment: weekly  ,  Social History   Tobacco Use  Smoking Status Never Smoker  Smokeless Tobacco Never Used  ,    Current Outpatient Medications on File Prior to Visit  Medication Sig Dispense Refill  .  nortriptyline (PAMELOR) 10 MG capsule Take 20 mg by mouth at bedtime.     . traMADol (ULTRAM) 50 MG tablet Take by mouth 3 (three) times daily.    . TURMERIC PO Take 1 tablet by mouth daily.    Marland Kitchen zolpidem (AMBIEN) 10 MG tablet Take 1 tablet (10 mg total) by mouth at bedtime as needed for sleep. 90 tablet 1   No current facility-administered medications on file prior to visit.      Allergies  Allergen Reactions  . Trazodone And Nefazodone Other (See Comments)    Passes out     Review of Systems:   General:  Denies fever, chills Optho/Auditory:   Denies visual changes, blurred vision Respiratory:   Denies SOB, cough, wheeze, DIB  Cardiovascular:   Denies chest pain, palpitations, painful respirations Gastrointestinal:   Denies nausea, vomiting, diarrhea.  Endocrine:     Denies new hot or cold intolerance Musculoskeletal:  Denies joint swelling, gait issues, or new unexplained myalgias/ arthralgias Skin:  Denies rash, suspicious lesions  Neurological:    Denies dizziness, unexplained weakness, numbness  Psychiatric/Behavioral:   Denies mood changes    Objective:     Blood pressure 114/78, pulse 88, temperature 98.3 F (36.8 C), height 5\' 6"  (1.676 m), weight 150 lb (68 kg), SpO2 100 %.  Body mass index is 24.21 kg/m.  General: Well Developed, well nourished, and in no acute distress.  HEENT: Normocephalic, atraumatic, pupils equal round reactive to light, neck supple, No carotid bruits, no JVD Skin: Warm and dry, cap RF less 2 sec Cardiac: Regular rate and rhythm, S1, S2 WNL's, no murmurs rubs or gallops Respiratory: ECTA B/L, Not using accessory muscles, speaking in full sentences. NeuroM-Sk: Ambulates w/o assistance, moves ext * 4 w/o difficulty, sensation grossly intact.  Ext: scant edema b/l lower ext Psych: No HI/SI, judgement and insight good, Euthymic mood. Full Affect.

## 2018-12-27 ENCOUNTER — Telehealth: Payer: Self-pay | Admitting: Family Medicine

## 2018-12-27 ENCOUNTER — Other Ambulatory Visit: Payer: Self-pay

## 2018-12-27 DIAGNOSIS — G4700 Insomnia, unspecified: Secondary | ICD-10-CM

## 2018-12-27 MED ORDER — ZOLPIDEM TARTRATE 10 MG PO TABS: 10.0000 mg | ORAL_TABLET | Freq: Every evening | ORAL | 0 refills | Status: DC | PRN

## 2018-12-27 NOTE — Telephone Encounter (Signed)
Patient is requesting a refill of his Ambien, if approved please send to [facility] [address]. Patient states he has about 4 days left of med.

## 2018-12-27 NOTE — Telephone Encounter (Signed)
Controlled substance reviewed chart and data base and sent request to Mina Marble, NP to review and advise. MPulliam, CMA/RT(R)

## 2018-12-27 NOTE — Telephone Encounter (Signed)
Patient is requesting a refill on Ambien last written on 05/03/2018 for #90 1 RF.  LOV 11/03/2018.  [place] controlled substance data base reviewed and no aberrancies noted. Please review and advise. MPulliam, CMA/RT(R)

## 2019-02-03 ENCOUNTER — Telehealth: Payer: Self-pay | Admitting: Family Medicine

## 2019-02-03 NOTE — Telephone Encounter (Signed)
Called pharmacy and verified that patient only receive 30 and still has # 60 as refills.  Advised patient to contact the pharmacy. MPulliam, CMA/RT(R)

## 2019-02-03 NOTE — Telephone Encounter (Signed)
Patient called to request refill on :  zolpidem (AMBIEN) 10 MG tablet [[id number]]   Order Details  Dose: 10 mg Route: Oral Frequency: At bedtime PRN for sleep  Dispense Quantity: 90 tablet Refills: 0 Fills remaining: --        Sig: Take 1 tablet (10 mg total) by mouth at bedtime as needed for sleep.     Forwarding request to medical assistant if approved to send to :  Preferred Pharmacies      [id number] [name], [place] [phone] (Phone) [phone] (Fax)   --glh

## 2019-03-03 ENCOUNTER — Other Ambulatory Visit (INDEPENDENT_AMBULATORY_CARE_PROVIDER_SITE_OTHER): Payer: Managed Care, Other (non HMO)

## 2019-03-03 ENCOUNTER — Other Ambulatory Visit: Payer: Self-pay

## 2019-03-03 DIAGNOSIS — E786 Lipoprotein deficiency: Secondary | ICD-10-CM

## 2019-03-03 DIAGNOSIS — E1169 Type 2 diabetes mellitus with other specified complication: Secondary | ICD-10-CM

## 2019-03-03 DIAGNOSIS — G894 Chronic pain syndrome: Secondary | ICD-10-CM

## 2019-03-03 DIAGNOSIS — E785 Hyperlipidemia, unspecified: Secondary | ICD-10-CM

## 2019-03-03 DIAGNOSIS — Z23 Encounter for immunization: Secondary | ICD-10-CM

## 2019-03-03 DIAGNOSIS — E119 Type 2 diabetes mellitus without complications: Secondary | ICD-10-CM

## 2019-03-04 ENCOUNTER — Other Ambulatory Visit: Payer: Managed Care, Other (non HMO)

## 2019-03-04 LAB — CBC WITH DIFFERENTIAL/PLATELET
Basophils Absolute: 0 10*3/uL (ref 0.0–0.2)
Basos: 0 %
EOS (ABSOLUTE): 0.3 10*3/uL (ref 0.0–0.4)
Eos: 6 %
Hematocrit: 36.3 % — ABNORMAL LOW (ref 37.5–51.0)
Hemoglobin: 12.7 g/dL — ABNORMAL LOW (ref 13.0–17.7)
Immature Grans (Abs): 0 10*3/uL (ref 0.0–0.1)
Immature Granulocytes: 0 %
Lymphocytes Absolute: 1.9 10*3/uL (ref 0.7–3.1)
Lymphs: 34 %
MCH: 29.4 pg (ref 26.6–33.0)
MCHC: 35 g/dL (ref 31.5–35.7)
MCV: 84 fL (ref 79–97)
Monocytes Absolute: 0.5 10*3/uL (ref 0.1–0.9)
Monocytes: 8 %
Neutrophils Absolute: 3 10*3/uL (ref 1.4–7.0)
Neutrophils: 52 %
Platelets: 235 10*3/uL (ref 150–450)
RBC: 4.32 x10E6/uL (ref 4.14–5.80)
RDW: 12.7 % (ref 11.6–15.4)
WBC: 5.7 10*3/uL (ref 3.4–10.8)

## 2019-03-04 LAB — COMPREHENSIVE METABOLIC PANEL
ALT: 35 IU/L (ref 0–44)
AST: 23 IU/L (ref 0–40)
Albumin/Globulin Ratio: 2.6 — ABNORMAL HIGH (ref 1.2–2.2)
Albumin: 4.7 g/dL (ref 3.8–4.9)
Alkaline Phosphatase: 42 IU/L (ref 39–117)
BUN/Creatinine Ratio: 18 (ref 9–20)
BUN: 15 mg/dL (ref 6–24)
Bilirubin Total: 0.9 mg/dL (ref 0.0–1.2)
CO2: 25 mmol/L (ref 20–29)
Calcium: 9.4 mg/dL (ref 8.7–10.2)
Chloride: 102 mmol/L (ref 96–106)
Creatinine, Ser: 0.84 mg/dL (ref 0.76–1.27)
GFR calc Af Amer: 116 mL/min/{1.73_m2} (ref >59)
GFR calc non Af Amer: 101 mL/min/{1.73_m2} (ref >59)
Globulin, Total: 1.8 g/dL (ref 1.5–4.5)
Glucose: 111 mg/dL — ABNORMAL HIGH (ref 65–99)
Potassium: 4.7 mmol/L (ref 3.5–5.2)
Sodium: 140 mmol/L (ref 134–144)
Total Protein: 6.5 g/dL (ref 6.0–8.5)

## 2019-03-04 LAB — LIPID PANEL
Chol/HDL Ratio: 6.1 ratio — ABNORMAL HIGH (ref 0.0–5.0)
Cholesterol, Total: 177 mg/dL (ref 100–199)
HDL: 29 mg/dL — ABNORMAL LOW (ref >39)
LDL Calculated: 119 mg/dL — ABNORMAL HIGH (ref 0–99)
Triglycerides: 144 mg/dL (ref 0–149)
VLDL Cholesterol Cal: 29 mg/dL (ref 5–40)

## 2019-03-04 LAB — TSH: TSH: 2.76 u[IU]/mL (ref 0.450–4.500)

## 2019-03-04 LAB — HEMOGLOBIN A1C
Est. average glucose Bld gHb Est-mCnc: 128 mg/dL
Hgb A1c MFr Bld: 6.1 % — ABNORMAL HIGH (ref 4.8–5.6)

## 2019-03-04 LAB — VITAMIN D 25 HYDROXY (VIT D DEFICIENCY, FRACTURES): Vit D, 25-Hydroxy: 41 ng/mL (ref 30.0–100.0)

## 2019-03-04 LAB — T4, FREE: Free T4: 1.08 ng/dL (ref 0.82–1.77)

## 2019-03-10 ENCOUNTER — Ambulatory Visit: Payer: Managed Care, Other (non HMO) | Admitting: Family Medicine

## 2019-03-10 ENCOUNTER — Other Ambulatory Visit: Payer: Self-pay

## 2019-03-11 ENCOUNTER — Ambulatory Visit: Payer: Managed Care, Other (non HMO) | Admitting: Family Medicine

## 2019-03-16 ENCOUNTER — Ambulatory Visit: Payer: Managed Care, Other (non HMO) | Admitting: Family Medicine

## 2019-03-22 ENCOUNTER — Encounter: Payer: Self-pay | Admitting: Family Medicine

## 2019-03-22 ENCOUNTER — Ambulatory Visit (INDEPENDENT_AMBULATORY_CARE_PROVIDER_SITE_OTHER): Payer: Managed Care, Other (non HMO) | Admitting: Family Medicine

## 2019-03-22 ENCOUNTER — Other Ambulatory Visit: Payer: Self-pay

## 2019-03-22 VITALS — BP 110/70 | Ht 66.0 in | Wt 150.0 lb

## 2019-03-22 DIAGNOSIS — E1169 Type 2 diabetes mellitus with other specified complication: Secondary | ICD-10-CM | POA: Diagnosis not present

## 2019-03-22 DIAGNOSIS — E786 Lipoprotein deficiency: Secondary | ICD-10-CM

## 2019-03-22 DIAGNOSIS — E785 Hyperlipidemia, unspecified: Secondary | ICD-10-CM

## 2019-03-22 DIAGNOSIS — E781 Pure hyperglyceridemia: Secondary | ICD-10-CM | POA: Diagnosis not present

## 2019-03-22 DIAGNOSIS — E119 Type 2 diabetes mellitus without complications: Secondary | ICD-10-CM

## 2019-03-22 DIAGNOSIS — E559 Vitamin D deficiency, unspecified: Secondary | ICD-10-CM

## 2019-03-22 MED ORDER — OMEGA-3-ACID ETHYL ESTERS 1 G PO CAPS
2.0000 g | ORAL_CAPSULE | Freq: Two times a day (BID) | ORAL | 3 refills | Status: DC
Start: 2019-03-22 — End: 2019-08-29

## 2019-03-22 MED ORDER — ATORVASTATIN CALCIUM 10 MG PO TABS: 5.0000 mg | ORAL_TABLET | Freq: Every day | ORAL | 1 refills | Status: DC

## 2019-03-22 NOTE — Progress Notes (Signed)
Virtual / live video office visit note for [employer], D.O- Primary Care Physician at [facility]   I connected with current patient today and beyond visually recognizing the correct individual, I verified that I am speaking with the correct person using two identifiers.  . Location of the patient: Home . Location of the provider: Office Only the patient (+/- their family members at pt's discretion) and myself were participating in the encounter    - This visit type was conducted due to national recommendations for restrictions regarding the COVID-19 Pandemic (e.g. social distancing) in an effort to limit this patient's exposure and mitigate transmission in our community.  This format is felt to be most appropriate for this patient at this time.   - The patient did have access to video technology today     - No physical exam could be performed with this format, beyond that communicated to [place] by the patient/ family members as noted.   - Additionally my office staff/ schedulers discussed with the patient that there may be a monetary charge related to this service, depending on patient's medical insurance.   The patient expressed understanding, and agreed to proceed.      History of Present Illness:   Hyperlipidemia: Patient has been really again starting medicines for some time now.  He has been working on diet and exercise very strictly both of his life.  He has been resistant however, even with improvement in his cholesterol panel he still is over the acceptable limit and also, reminded patient he is a diabetic thus should be on a statin.   -He has never tried 1  -He has been drinking plenty water one half his weight in ounces water per day and he does live a very active lifestyle. -Although patient has been a little less active than usual since he is not able to go to the gym and swim daily like he used to. -He does have a swimming pool at home and hence we talked about him  getting some large stretch bands to fix to the side of the pool and then put over his shoulders bilaterally which can cause resistance and he can do his swimming.  The 10-year ASCVD risk score Mikey Bussing DC Brooke Bonito., et al., 2013) is: 8.9%   Values used to calculate the score:     Age: 53 years     Sex: Male     Is Non-Hispanic African American: No     Diabetic: Yes     Tobacco smoker: No     Systolic Blood Pressure: 242 mmHg     Is BP treated: No     HDL Cholesterol: 29 mg/dL     Total Cholesterol: 177 mg/dL   DM HPI: -  He has been working on diet and exercise for diabetes  Medication compliance - good  Home fasting glucose readings:  105-115;  2 hr PP: not checking   Denies polyuria/polydipsia.  Denies hypo/ hyperglycemia symptoms  Last diabetic eye exam was  Lab Results  Component Value Date   HMDIABEYEEXA No Retinopathy 04/07/2018    Last A1C in the office was:  Lab Results  Component Value Date   HGBA1C 6.1 (H) 03/03/2019   HGBA1C 6.1 (A) 11/03/2018   HGBA1C 6.2 05/03/2018    Lab Results  Component Value Date   MICROALBUR 30 05/03/2018   LDLCALC 119 (H) 03/03/2019   CREATININE 0.84 03/03/2019    Wt Readings from Last 3  Encounters:  03/22/19 150 lb (68 kg)  11/03/18 150 lb (68 kg)  08/12/18 142 lb (64.4 kg)    BP Readings from Last 3 Encounters:  03/22/19 110/70  11/03/18 114/78  08/12/18 109/72      Impression and Recommendations:    1. Diet-controlled diabetes mellitus ([place])   2. Low HDL (under 40)   3. Hyperlipidemia associated with type 2 diabetes mellitus ([place])   4. High triglycerides   5. Vitamin D deficiency    -A1c well controlled.  Patient is a diet-controlled diabetic and we will continue monitor this every 6 months. -For his low HDL, patient states he has better insurance and we will change from his over-the-counter fish oil to Lovaza.  He was on this in the past tolerated well and it worked well for him.  His HDL did go down and hence this  in addition to his high triglycerides-we will start this medicine for him. -For his ASCVD risk of 8.9% and increased LDL as well as being a diet-controlled diabetic, patient today finally agrees to go on a statin.  We will start him on the lowest dose of Lipitor 5 mg nightly.  He will come in in 6 weeks for ALT and 4 months for fasting lipid profile with office visit 3 days later. -For vitamin D deficiency patient has been taking 1000 IUs of vitamin D daily.  I did fix this in his chart to reveal what he is actually taking.  Told him to take the 1000 daily during the summer and increase to 2000 daily in the winter  - As part of my medical decision making, I reviewed the following data within the [place] History obtained from pt /family, CMA notes reviewed and incorporated if applicable, Labs reviewed, Radiograph/ tests reviewed if applicable and OV notes from prior OV's with me, as well as other specialists she/he has seen since seeing me last, were all reviewed and used in my medical decision making process today.   - Additionally, discussion had with patient regarding txmnt plan, their biases about that plan etc were used in my medical decision making today.   - The patient agreed with the plan and demonstrated an understanding of the instructions.   No barriers to understanding were identified.   - Red flag symptoms and signs discussed in detail.  Patient expressed understanding regarding what to do in case of emergency\ urgent symptoms.  The patient was advised to call back or seek an in-person evaluation if the symptoms worsen or if the condition fails to improve as anticipated.   Return for 6 weeks lab only; 4 months office visit with FLP 3 days prior.    Orders Placed This Encounter  Procedures  . ALT  . Lipid panel    Meds ordered this encounter  Medications  . omega-3 acid ethyl esters (LOVAZA) 1 g capsule    Sig: Take 2 capsules (2 g total) by mouth 2 (two) times  daily.    Dispense:  180 capsule    Refill:  3  . atorvastatin (LIPITOR) 10 MG tablet    Sig: Take 0.5 tablets (5 mg total) by mouth at bedtime.    Dispense:  45 tablet    Refill:  1    Medications Discontinued During This Encounter  Medication Reason  . Omega 3 1200 MG CAPS Dose change      I provided 23+ minutes of non-face-to-face time during this encounter,with over 50% of the time in  direct counseling on patients medical conditions/ medical concerns.  Additional time was spent with charting and coordination of care after the actual visit commenced.   Note:  This note was prepared with assistance of Dragon voice recognition software. Occasional wrong-word or sound-a-like substitutions may have occurred due to the inherent limitations of voice recognition software. Mellody Dance, DO 03/22/2019 9:47 AM     Patient Care Team    Relationship Specialty Notifications Start End  Mellody Dance, DO PCP - General Family Medicine  11/05/16   Carlyle Dolly, MD Resident Family Medicine All results, Admissions 10/03/15   [name], Kevan Ny, MD Consulting Physician Neurosurgery  11/05/16   Marlaine Hind, MD Consulting Physician Physical Medicine and Rehabilitation  11/05/16    Comment: pain Doc  [place], [name], OD  Optometry  05/03/18     Allergies  Allergen Reactions  . Trazodone And Nefazodone Other (See Comments)    Passes out    -Vitals obtained; medications/ allergies reconciled;  personal medical, social, Sx etc.histories were updated by CMA, reviewed by me and are reflected in chart  Patient Active Problem List   Diagnosis Date Noted  . Low HDL (under 40) 11/05/2016    Priority: High  . Diet-controlled diabetes mellitus ([place]) 11/05/2016    Priority: High  . Status post colonoscopy 2008 11/05/2016    Priority: High  . Insomnia 09/28/2015    Priority: Medium  . Chronic pain associated with significant psychosocial dysfunction 09/05/2015    Priority: Medium  .  Status post lumbar spinal fusion 11/05/2016    Priority: Low  . Not currently working due to disabled status 11/05/2016    Priority: Low  . Status post cervical spinal fusion 11/05/2016    Priority: Low  . Long-term use of high-risk medication 11/05/2016    Priority: Low  . Muscle spasms of neck, upper back & lower back 11/05/2016    Priority: Low  . Dizziness 08/12/2018  . Orthostatic hypotension 08/12/2018  . Dehydration, mild 08/12/2018  . Vitamin D insufficiency 01/01/2018  . Hyperlipidemia associated with type 2 diabetes mellitus ([place]) 01/01/2018  . Low testosterone level in male 01/01/2018  . Osteoarthritis of spine with radiculopathy, cervical region 09/01/2016  . DDD (degenerative disc disease), lumbar 09/28/2015  . DDD (degenerative disc disease), cervical 09/05/2015  . Chronic pain syndrome 09/05/2015  . Cervical stenosis of spine 09/05/2015  . Cervical pain (neck) 08/07/2015     Current Meds  Medication Sig  . cholecalciferol (VITAMIN D3) 25 MCG (1000 UT) tablet Take 1,000 Units by mouth daily.  . nortriptyline (PAMELOR) 10 MG capsule Take 20 mg by mouth at bedtime.   . traMADol (ULTRAM) 50 MG tablet Take by mouth 3 (three) times daily.  . TURMERIC PO Take 1 tablet by mouth daily.  Marland Kitchen zolpidem (AMBIEN) 10 MG tablet Take 1 tablet (10 mg total) by mouth at bedtime as needed for sleep.  . [DISCONTINUED] Omega 3 1200 MG CAPS Take 2,400 tablets by mouth daily.     Allergies  Allergen Reactions  . Trazodone And Nefazodone Other (See Comments)    Passes out     ROS:  See above HPI for pertinent positives and negatives   Objective:   Blood pressure 110/70, height 5\' 6"  (1.676 m), weight 150 lb (68 kg).  (if some vitals are omitted, this means that patient was UNABLE to obtain them even though they were asked to get them prior to OV today.  They were asked to call us at their  earliest convenience with these once obtained.)  General: A & O * 3; visually in no acute  distress; in usual state of health.  Skin: Visible skin appears normal and pt's usual skin color HEENT:  EOMI, head is normocephalic and atraumatic.  Sclera are anicteric. Neck has a good range of motion.  Lips are noncyanotic Chest: normal chest excursion and movement Respiratory: speaking in full sentences, no conversational dyspnea; no use of accessory muscles Psych: insight good, mood- appears full   Current Outpatient Medications:  .  cholecalciferol (VITAMIN D3) 25 MCG (1000 UT) tablet, Take 1,000 Units by mouth daily., Disp: , Rfl:  .  nortriptyline (PAMELOR) 10 MG capsule, Take 20 mg by mouth at bedtime. , Disp: , Rfl:  .  traMADol (ULTRAM) 50 MG tablet, Take by mouth 3 (three) times daily., Disp: , Rfl:  .  TURMERIC PO, Take 1 tablet by mouth daily., Disp: , Rfl:  .  zolpidem (AMBIEN) 10 MG tablet, Take 1 tablet (10 mg total) by mouth at bedtime as needed for sleep., Disp: 90 tablet, Rfl: 0 .  atorvastatin (LIPITOR) 10 MG tablet, Take 0.5 tablets (5 mg total) by mouth at bedtime., Disp: 45 tablet, Rfl: 1 .  omega-3 acid ethyl esters (LOVAZA) 1 g capsule, Take 2 capsules (2 g total) by mouth 2 (two) times daily., Disp: 180 capsule, Rfl: 3

## 2019-04-07 ENCOUNTER — Other Ambulatory Visit: Payer: Self-pay | Admitting: Adult Health

## 2019-04-07 DIAGNOSIS — G4700 Insomnia, unspecified: Secondary | ICD-10-CM

## 2019-04-11 ENCOUNTER — Other Ambulatory Visit: Payer: Self-pay | Admitting: Family Medicine

## 2019-04-11 DIAGNOSIS — G4700 Insomnia, unspecified: Secondary | ICD-10-CM

## 2019-04-11 NOTE — Addendum Note (Signed)
Addended by: Fonnie Mu on: 04/11/2019 10:47 AM   Modules accepted: Orders

## 2019-04-12 ENCOUNTER — Other Ambulatory Visit: Payer: Self-pay

## 2019-04-12 DIAGNOSIS — G4700 Insomnia, unspecified: Secondary | ICD-10-CM

## 2019-04-12 MED ORDER — ZOLPIDEM TARTRATE 10 MG PO TABS: 10.0000 mg | ORAL_TABLET | Freq: Every evening | ORAL | 0 refills | Status: DC | PRN

## 2019-04-12 NOTE — Telephone Encounter (Signed)
Pharmacy sending refill request for Ambien. Medication last written 12/27/2018 for # 90 no refills.  [place] controlled substance database reviewed no aberrancies noted. LOV 03/22/2019.  Please review and advise. MPulliam, CMA/RT(R)

## 2019-04-20 NOTE — Telephone Encounter (Signed)
Please call pt and see if he got this script or not.  If not, please append med RF and send back to me- THANKS.

## 2019-04-20 NOTE — Telephone Encounter (Signed)
Called patient he did pick up refill of the medication. MPulliam, CMA/RT(R)

## 2019-05-10 ENCOUNTER — Telehealth: Payer: Self-pay | Admitting: Family Medicine

## 2019-05-10 NOTE — Telephone Encounter (Signed)
Called and left message to call the office back. MPulliam, CMA/RT(R)  

## 2019-05-10 NOTE — Telephone Encounter (Signed)
Richard Noble @ [facility] called to request provider call back to discuss pt's Rx for :   zolpidem (AMBIEN) 10 MG tablet [[id number]]   Order Details Dose: 10 mg Route: Oral Frequency: At bedtime PRN for sleep  Dispense Quantity: 90 tablet Refills: 0 Fills remaining: --        Sig: Take 1 tablet (10 mg total) by mouth at bedtime as needed for sleep.          ---- Forwarding message to medical assistant to call [phone] .  --glh

## 2019-05-11 NOTE — Telephone Encounter (Signed)
Spoke to [place], need to know if medication should be included in worker's comp claim.  She will email me paper work for the provider to review and fill out. MPulliam, CMA/RT(R)

## 2019-07-07 ENCOUNTER — Other Ambulatory Visit: Payer: Self-pay | Admitting: Family Medicine

## 2019-07-07 DIAGNOSIS — G4700 Insomnia, unspecified: Secondary | ICD-10-CM

## 2019-08-27 ENCOUNTER — Other Ambulatory Visit: Payer: Self-pay | Admitting: Family Medicine

## 2019-08-27 DIAGNOSIS — E781 Pure hyperglyceridemia: Secondary | ICD-10-CM

## 2019-08-27 DIAGNOSIS — E786 Lipoprotein deficiency: Secondary | ICD-10-CM

## 2019-08-27 DIAGNOSIS — E1169 Type 2 diabetes mellitus with other specified complication: Secondary | ICD-10-CM

## 2019-09-09 ENCOUNTER — Other Ambulatory Visit: Payer: Self-pay | Admitting: Family Medicine

## 2019-09-09 DIAGNOSIS — E781 Pure hyperglyceridemia: Secondary | ICD-10-CM

## 2019-09-09 DIAGNOSIS — E1169 Type 2 diabetes mellitus with other specified complication: Secondary | ICD-10-CM

## 2019-09-09 DIAGNOSIS — E785 Hyperlipidemia, unspecified: Secondary | ICD-10-CM

## 2019-09-09 DIAGNOSIS — E786 Lipoprotein deficiency: Secondary | ICD-10-CM

## 2019-09-24 ENCOUNTER — Other Ambulatory Visit: Payer: Self-pay | Admitting: Family Medicine

## 2019-09-24 DIAGNOSIS — E785 Hyperlipidemia, unspecified: Secondary | ICD-10-CM

## 2019-09-24 DIAGNOSIS — E781 Pure hyperglyceridemia: Secondary | ICD-10-CM

## 2019-09-24 DIAGNOSIS — E786 Lipoprotein deficiency: Secondary | ICD-10-CM

## 2019-09-24 DIAGNOSIS — E1169 Type 2 diabetes mellitus with other specified complication: Secondary | ICD-10-CM

## 2019-09-27 DIAGNOSIS — Z79899 Other long term (current) drug therapy: Secondary | ICD-10-CM | POA: Insufficient documentation

## 2019-10-05 ENCOUNTER — Other Ambulatory Visit: Payer: Self-pay | Admitting: Family Medicine

## 2019-10-05 DIAGNOSIS — G4700 Insomnia, unspecified: Secondary | ICD-10-CM

## 2019-10-05 MED ORDER — ZOLPIDEM TARTRATE 10 MG PO TABS: 10.0000 mg | ORAL_TABLET | Freq: Every evening | ORAL | 0 refills | Status: DC | PRN

## 2019-10-05 NOTE — Addendum Note (Signed)
Addended by: Fonnie Mu on: 10/05/2019 03:15 PM   Modules accepted: Orders

## 2019-10-05 NOTE — Telephone Encounter (Signed)
Patient is requesting a refill of his Ambien, if approved please send to [facility] on [address]

## 2019-10-12 ENCOUNTER — Other Ambulatory Visit: Payer: Self-pay

## 2019-10-12 ENCOUNTER — Encounter: Payer: Self-pay | Admitting: Family Medicine

## 2019-10-12 ENCOUNTER — Ambulatory Visit (INDEPENDENT_AMBULATORY_CARE_PROVIDER_SITE_OTHER): Payer: Managed Care, Other (non HMO) | Admitting: Family Medicine

## 2019-10-12 VITALS — Ht 65.0 in | Wt 150.0 lb

## 2019-10-12 DIAGNOSIS — E781 Pure hyperglyceridemia: Secondary | ICD-10-CM | POA: Diagnosis not present

## 2019-10-12 DIAGNOSIS — E119 Type 2 diabetes mellitus without complications: Secondary | ICD-10-CM | POA: Diagnosis not present

## 2019-10-12 DIAGNOSIS — E1169 Type 2 diabetes mellitus with other specified complication: Secondary | ICD-10-CM | POA: Diagnosis not present

## 2019-10-12 DIAGNOSIS — G894 Chronic pain syndrome: Secondary | ICD-10-CM

## 2019-10-12 DIAGNOSIS — E559 Vitamin D deficiency, unspecified: Secondary | ICD-10-CM

## 2019-10-12 DIAGNOSIS — E786 Lipoprotein deficiency: Secondary | ICD-10-CM

## 2019-10-12 DIAGNOSIS — G4700 Insomnia, unspecified: Secondary | ICD-10-CM

## 2019-10-12 DIAGNOSIS — E785 Hyperlipidemia, unspecified: Secondary | ICD-10-CM

## 2019-10-12 MED ORDER — ATORVASTATIN CALCIUM 10 MG PO TABS: 5.0000 mg | ORAL_TABLET | Freq: Every day | ORAL | 1 refills | Status: DC

## 2019-10-12 MED ORDER — OMEGA-3-ACID ETHYL ESTERS 1 G PO CAPS: 2.0000 g | ORAL_CAPSULE | Freq: Two times a day (BID) | ORAL | 1 refills | Status: DC

## 2019-10-12 MED ORDER — ZOLPIDEM TARTRATE 10 MG PO TABS: 10.0000 mg | ORAL_TABLET | Freq: Every evening | ORAL | 1 refills | Status: DC | PRN

## 2019-10-12 NOTE — Progress Notes (Signed)
Virtual / live video office visit note for [employer], D.O- Primary Care Physician at [facility]   I connected with current patient today and beyond visually recognizing the correct individual, I verified that I am speaking with the correct person using two identifiers.   Location of the patient: Home  Location of the provider: Office Only the patient (+/- their family members at pt's discretion) and myself were participating in the encounter    - This visit type was conducted due to national recommendations for restrictions regarding the COVID-19 Pandemic (e.g. social distancing) in an effort to limit this patient's exposure and mitigate transmission in our community.  This format is felt to be most appropriate for this patient at this time.   - The patient did have access to video technology today  - No physical exam could be performed with this format, beyond that communicated to [place] by the patient/ family members as noted.   - Additionally my office staff/ schedulers discussed with the patient that there may be a monetary charge related to this service, depending on patient's medical insurance.   The patient expressed understanding, and agreed to proceed.      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  Notes he's been doubling up on masks and only going to the supermarket.  His mother is doing well; states she's staying in her house and not going anywhere.  - Vitamin D Deficiency Continue's taking 1000 IU's daily.  - Insomnia He continues taking Ambien daily.  He has not tried sleep meditation.  Says his intention was to try different things for his back pain, but "had to take a step back because of COVID."  - Back Pain Notes it's gotten worse in the winter time, unfortunately.  Says around this time, is when he takes the tramadol.  He continues to follow up with specialist and  is stable for now.  Says "I haven't been able to get any MRI or anything like that; I've been avoiding going anywhere really."  HPI:  Hyperlipidemia:  53 y.o. male here for cholesterol follow-up.   - Patient reports good compliance with treatment plan of:  medication and/ or lifestyle management.    He has been taking a half-tablet of statin, and the fish oil pills as prescribed.  He continues attempting to make prudent lifestyle changes; notes he hasn't been to the gym due to COVID-19.  He's trying to keep himself occupied and often goes for walks.  - Patient denies any acute concerns or problems with management plan   - He denies new onset of: myalgias, arthralgias, increased fatigue more than normal, chest pains, exercise intolerance, shortness of breath, dizziness, visual changes, headache, lower extremity swelling or claudication.   Most recent cholesterol panel was:  Lab Results  Component Value Date   CHOL 177 03/03/2019   HDL 29 (L) 03/03/2019   LDLCALC 119 (H) 03/03/2019   TRIG 144 03/03/2019   CHOLHDL 6.1 (H) 03/03/2019   Hepatic Function Latest Ref Rng & Units 03/03/2019 01/01/2018 02/09/2017  Total Protein 6.0 - 8.5 g/dL 6.5 7.3 7.2  Albumin 3.8 - 4.9 g/dL 4.7 4.9 4.5  AST 0 - 40 IU/L 23 39 21  ALT 0 - 44 IU/L 35 65(H) 28  Alk Phosphatase 39 - 117 IU/L 42 46 42  Total Bilirubin 0.0 - 1.2 mg/dL 0.9 0.6 0.8  HPI:   Diabetes Mellitus:  Home glucose readings:  States every time he checks his sugars, they seem fine, falling around 105, 110.  Says he also measures two hours after he eats a heavy meal.   - Patient reports good compliance with therapy plan: medication and/or lifestyle modification  - His denies acute concerns or problems related to treatment plan  - He denies new concerns.  Denies polyuria/polydipsia, hypo/ hyperglycemia symptoms.  Denies new onset of: chest pain, exercise intolerance, shortness of breath, dizziness, visual changes, headache, lower  extremity swelling or claudication.   Last A1C in the office was:  Lab Results  Component Value Date   HGBA1C 6.1 (H) 03/03/2019   HGBA1C 6.1 (A) 11/03/2018   HGBA1C 6.2 05/03/2018   Lab Results  Component Value Date   MICROALBUR 30 05/03/2018   LDLCALC 119 (H) 03/03/2019   CREATININE 0.84 03/03/2019   BP Readings from Last 3 Encounters:  03/22/19 110/70  11/03/18 114/78  08/12/18 109/72   Wt Readings from Last 3 Encounters:  10/12/19 150 lb (68 kg)  03/22/19 150 lb (68 kg)  11/03/18 150 lb (68 kg)      Depression screen [facility] 2/9 10/12/2019 03/22/2019 11/03/2018 08/12/2018 05/03/2018  Decreased Interest 0 0 0 0 0  Down, Depressed, Hopeless 0 0 0 0 0  PHQ - 2 Score 0 0 0 0 0  Altered sleeping 0 0 0 0 1  Tired, decreased energy 0 0 0 2 0  Change in appetite 0 0 0 0 0  Feeling bad or failure about yourself  0 0 0 0 0  Trouble concentrating 0 0 0 0 0  Moving slowly or fidgety/restless 0 0 0 0 0  Suicidal thoughts 0 0 0 0 0  PHQ-9 Score 0 0 0 2 1  Difficult doing work/chores - Not difficult at all Not difficult at all Not difficult at all -    GAD 7 : Generalized Anxiety Score 03/22/2019  Nervous, Anxious, on Edge 0  Control/stop worrying 0  Worry too much - different things 0  Trouble relaxing 0  Restless 0  Easily annoyed or irritable 0  Afraid - awful might happen 0  Total GAD 7 Score 0  Anxiety Difficulty Not difficult at all     Impression and Recommendations:    1. Diet-controlled diabetes mellitus ([place])   2. Hyperlipidemia associated with type 2 diabetes mellitus ([place])   3. High triglycerides   4. Low HDL (under 40)   5. Insomnia, unspecified type   6. Chronic pain associated with significant psychosocial dysfunction   7. Vitamin D deficiency    - Last lab work drawn 03/03/2019.  Diet Controlled DM - Last A1c measured at 6.1, stable from 6.1 prior. - At goal, diet-controlled.  Continue established lifestyle modifications.  - Counseled patient on  pathophysiology of disease and discussed various treatment options, which always includes dietary and lifestyle modification as first line.    - Importance of low carb, heart-healthy diet discussed with patient in addition to regular aerobic exercise of 55mn 5d/week or more.   - Check FBS and 2 hours after the biggest meal of your day.  Keep log and bring in next OV for my review.     - Also told patient if you ever feel poorly, please check your blood pressure and blood sugar, as one or the other could be the cause of your symptoms.  - Pt reminded about need for yearly eye and foot exams.  Told patient to make appt.for diabetic eye exam, CMAs here will do foot exams  - Handouts provided at patient's desire and or told to go online at the American Diabetes Association website for further information  - Will continue to monitor and re-check as discussed.  Hyperlipidemia, Hypertriglyceridemia, Low HDL - Last FLP drawn 7 months ago: HDL = 29, low, down from 33 prior. Triglycerides = 144, down from 198 prior. LDL = 119, elevated, down from 139 prior.  - LDL poorly controlled, not at goal. - HDL low, not at goal.  - Continue management on statin and Lovaza as established and will re-evaluate treatment plan as needed on lab re-check.  - Ongoing prudent dietary changes such as low saturated & trans fat diets for hyperlipidemia and low carb diets for hypertriglyceridemia discussed with patient.    - Encouraged patient to follow AHA guidelines for regular exercise.  - Educational handouts provided at patient's desire and/ or told to look online at the [employer] website for further information.  - We will continue to monitor and re-check as discussed.  Vitamin D Deficiency - 41 last check 7 months ago, stable, WNL. - Continue 1000 IU's daily.  See med list. - Discussed goal of maintenance in the 40-60 range. - Will continue to monitor and re-check as discussed.  Chronic  Pain associated with Significant Psychosocial Dysfunction - Per patient, symptoms stable at this time. - Continue management as established.  Will continue to monitor. - Per patient, notes that he's been avoiding further follow-up during COVID-19. - Encouraged patient to continue prudent lifestyle habits. - Will continue to monitor alongside specialist.  Insomnia - Stable/controlled on Ambien management. - Encouraged patient to engage in sleep meditation to improve sleep hygiene. - Discussed prudent lifestyle changes. - Will continue to monitor.  Recommendations - Return near future for repeat blood work. - Need for CPE near future.  - As part of my medical decision making, I reviewed the following data within the [place] History obtained from pt /family, CMA notes reviewed and incorporated if applicable, Labs reviewed, Radiograph/ tests reviewed if applicable and OV notes from prior OV's with me, as well as other specialists she/he has seen since seeing me last, were all reviewed and used in my medical decision making process today.   - Additionally, discussion had with patient regarding txmnt plan, their biases about that plan etc were used in my medical decision making today.   - The patient agreed with the plan and demonstrated an understanding of the instructions.   No barriers to understanding were identified.    - Red flag symptoms and signs discussed in detail.  Patient expressed understanding regarding what to do in case of emergency\ urgent symptoms.  The patient was advised to call back or seek an in-person evaluation if the symptoms worsen or if the condition fails to improve as anticipated.   Return for f/up near future for repeat fasting blood work,  and CPE near future-next 45mo    Orders Placed This Encounter  Procedures   VITAMIN D 25 Hydroxy (Vit-D Deficiency, Fractures)   Hemoglobin A1c   Lipid panel   Comprehensive metabolic panel    Meds  ordered this encounter  Medications   atorvastatin (LIPITOR) 10 MG tablet    Sig: Take 0.5 tablets (5 mg total) by mouth at bedtime.    Dispense:  45 tablet    Refill:  1   omega-3 acid ethyl esters (LOVAZA) 1 g  capsule    Sig: Take 2 capsules (2 g total) by mouth 2 (two) times daily.    Dispense:  360 capsule    Refill:  1   zolpidem (AMBIEN) 10 MG tablet    Sig: Take 1 tablet (10 mg total) by mouth at bedtime as needed for sleep.    Dispense:  90 tablet    Refill:  1    Medications Discontinued During This Encounter  Medication Reason   atorvastatin (LIPITOR) 10 MG tablet Reorder   omega-3 acid ethyl esters (LOVAZA) 1 g capsule Reorder   zolpidem (AMBIEN) 10 MG tablet Reorder      Note:  This note was prepared with assistance of Dragon voice recognition software. Occasional wrong-word or sound-a-like substitutions may have occurred due to the inherent limitations of voice recognition software.   This document serves as a record of services personally performed by Mellody Dance, DO. It was created on her behalf by [name], a trained medical scribe. The creation of this record is based on the scribe's personal observations and the provider's statements to them.   This case required medical decision making of at least moderate complexity. The above documentation has been reviewed to be accurate and was completed by Marjory Sneddon, D.O.    Patient Care Team    Relationship Specialty Notifications Start End  Mellody Dance, DO PCP - General Family Medicine  11/05/16   Carlyle Dolly, MD Resident Family Medicine All results, Admissions 10/03/15   [name], Kevan Ny, MD Consulting Physician Neurosurgery  11/05/16   Marlaine Hind, MD Consulting Physician Physical Medicine and Rehabilitation  11/05/16    Comment: pain Doc  [place], [name], Success  Optometry  05/03/18     -Vitals obtained; medications/ allergies reconciled;  personal medical, social, Sx  etc.histories were updated by CMA, reviewed by me and are reflected in chart  Patient Active Problem List   Diagnosis Date Noted   Low HDL (under 40) 11/05/2016   Diet-controlled diabetes mellitus ([place]) 11/05/2016   Status post colonoscopy 2008 11/05/2016   Insomnia 09/28/2015   Chronic pain associated with significant psychosocial dysfunction 09/05/2015   Status post lumbar spinal fusion 11/05/2016   Not currently working due to disabled status 11/05/2016   Status post cervical spinal fusion 11/05/2016   Long-term use of high-risk medication 11/05/2016   Muscle spasms of neck, upper back & lower back 11/05/2016   High triglycerides 03/22/2019   Dizziness 08/12/2018   Orthostatic hypotension 08/12/2018   Dehydration, mild 08/12/2018   Vitamin D deficiency 01/01/2018   Hyperlipidemia associated with type 2 diabetes mellitus ([place]) 01/01/2018   Low testosterone level in male 01/01/2018   Osteoarthritis of spine with radiculopathy, cervical region 09/01/2016   DDD (degenerative disc disease), lumbar 09/28/2015   DDD (degenerative disc disease), cervical 09/05/2015   Chronic pain syndrome 09/05/2015   Cervical stenosis of spine 09/05/2015   Cervical pain (neck) 08/07/2015     Current Meds  Medication Sig   atorvastatin (LIPITOR) 10 MG tablet Take 0.5 tablets (5 mg total) by mouth at bedtime.   cholecalciferol (VITAMIN D3) 25 MCG (1000 UT) tablet Take 1,000 Units by mouth daily.   nortriptyline (PAMELOR) 10 MG capsule Take 20 mg by mouth at bedtime.    omega-3 acid ethyl esters (LOVAZA) 1 g capsule Take 2 capsules (2 g total) by mouth 2 (two) times daily.   traMADol (ULTRAM) 50 MG tablet Take by mouth 3 (three) times daily.   TURMERIC PO Take  1 tablet by mouth daily.   zolpidem (AMBIEN) 10 MG tablet Take 1 tablet (10 mg total) by mouth at bedtime as needed for sleep.   [DISCONTINUED] atorvastatin (LIPITOR) 10 MG tablet Take 0.5 tablets (5 mg total) by mouth at bedtime. **PATIENT  NEEDS APT FOR FURTHER REFILLS**   [DISCONTINUED] omega-3 acid ethyl esters (LOVAZA) 1 g capsule Take 2 capsules (2 g total) by mouth 2 (two) times daily. **PATIENT NEEDS APT FOR FURTHER REFILLS**   [DISCONTINUED] zolpidem (AMBIEN) 10 MG tablet Take 1 tablet (10 mg total) by mouth at bedtime as needed for sleep.     Allergies  Allergen Reactions   Trazodone And Nefazodone Other (See Comments)    Passes out     ROS:  See above HPI for pertinent positives and negatives   Objective:   Height 5' 5"  (1.651 m), weight 150 lb (68 kg).  (if some vitals are omitted, this means that patient was UNABLE to obtain them even though they were asked to get them prior to OV today.  They were asked to call us at their earliest convenience with these once obtained.)  General: A & O * 3; visually in no acute distress; in usual state of health.  Skin: Visible skin appears normal and pt's usual skin color HEENT:  EOMI, head is normocephalic and atraumatic.  Sclera are anicteric. Neck has a good range of motion.  Lips are noncyanotic Chest: normal chest excursion and movement Respiratory: speaking in full sentences, no conversational dyspnea; no use of accessory muscles Psych: insight good, mood- appears full

## 2019-10-20 ENCOUNTER — Other Ambulatory Visit: Payer: Self-pay

## 2019-10-20 ENCOUNTER — Other Ambulatory Visit: Payer: Managed Care, Other (non HMO)

## 2019-10-20 DIAGNOSIS — E1169 Type 2 diabetes mellitus with other specified complication: Secondary | ICD-10-CM

## 2019-10-20 DIAGNOSIS — E781 Pure hyperglyceridemia: Secondary | ICD-10-CM

## 2019-10-20 DIAGNOSIS — E785 Hyperlipidemia, unspecified: Secondary | ICD-10-CM

## 2019-10-20 DIAGNOSIS — G894 Chronic pain syndrome: Secondary | ICD-10-CM

## 2019-10-20 DIAGNOSIS — E559 Vitamin D deficiency, unspecified: Secondary | ICD-10-CM

## 2019-10-20 DIAGNOSIS — E786 Lipoprotein deficiency: Secondary | ICD-10-CM

## 2019-10-20 DIAGNOSIS — E119 Type 2 diabetes mellitus without complications: Secondary | ICD-10-CM

## 2019-10-21 LAB — COMPREHENSIVE METABOLIC PANEL
ALT: 79 IU/L — ABNORMAL HIGH (ref 0–44)
AST: 28 IU/L (ref 0–40)
Albumin/Globulin Ratio: 2.5 — ABNORMAL HIGH (ref 1.2–2.2)
Albumin: 4.9 g/dL (ref 3.8–4.9)
Alkaline Phosphatase: 53 IU/L (ref 39–117)
BUN/Creatinine Ratio: 16 (ref 9–20)
BUN: 14 mg/dL (ref 6–24)
Bilirubin Total: 1 mg/dL (ref 0.0–1.2)
CO2: 25 mmol/L (ref 20–29)
Calcium: 9.6 mg/dL (ref 8.7–10.2)
Chloride: 101 mmol/L (ref 96–106)
Creatinine, Ser: 0.89 mg/dL (ref 0.76–1.27)
GFR calc Af Amer: 113 mL/min/{1.73_m2} (ref >59)
GFR calc non Af Amer: 98 mL/min/{1.73_m2} (ref >59)
Globulin, Total: 2 g/dL (ref 1.5–4.5)
Glucose: 260 mg/dL — ABNORMAL HIGH (ref 65–99)
Potassium: 4.7 mmol/L (ref 3.5–5.2)
Sodium: 140 mmol/L (ref 134–144)
Total Protein: 6.9 g/dL (ref 6.0–8.5)

## 2019-10-21 LAB — LIPID PANEL
Chol/HDL Ratio: 4.9 ratio (ref 0.0–5.0)
Cholesterol, Total: 138 mg/dL (ref 100–199)
HDL: 28 mg/dL — ABNORMAL LOW (ref >39)
LDL Chol Calc (NIH): 81 mg/dL (ref 0–99)
Triglycerides: 166 mg/dL — ABNORMAL HIGH (ref 0–149)
VLDL Cholesterol Cal: 29 mg/dL (ref 5–40)

## 2019-10-21 LAB — HEMOGLOBIN A1C
Est. average glucose Bld gHb Est-mCnc: 160 mg/dL
Hgb A1c MFr Bld: 7.2 % — ABNORMAL HIGH (ref 4.8–5.6)

## 2019-10-21 LAB — VITAMIN D 25 HYDROXY (VIT D DEFICIENCY, FRACTURES): Vit D, 25-Hydroxy: 25.6 ng/mL — ABNORMAL LOW (ref 30.0–100.0)

## 2019-11-02 ENCOUNTER — Other Ambulatory Visit: Payer: Self-pay

## 2019-11-02 ENCOUNTER — Encounter: Payer: Self-pay | Admitting: Family Medicine

## 2019-11-02 ENCOUNTER — Ambulatory Visit (INDEPENDENT_AMBULATORY_CARE_PROVIDER_SITE_OTHER): Payer: Managed Care, Other (non HMO) | Admitting: Family Medicine

## 2019-11-02 VITALS — Temp 97.6°F | Ht 66.0 in | Wt 150.0 lb

## 2019-11-02 DIAGNOSIS — E786 Lipoprotein deficiency: Secondary | ICD-10-CM

## 2019-11-02 DIAGNOSIS — E119 Type 2 diabetes mellitus without complications: Secondary | ICD-10-CM | POA: Diagnosis not present

## 2019-11-02 DIAGNOSIS — E781 Pure hyperglyceridemia: Secondary | ICD-10-CM

## 2019-11-02 DIAGNOSIS — G4700 Insomnia, unspecified: Secondary | ICD-10-CM

## 2019-11-02 DIAGNOSIS — E1169 Type 2 diabetes mellitus with other specified complication: Secondary | ICD-10-CM | POA: Diagnosis not present

## 2019-11-02 DIAGNOSIS — G894 Chronic pain syndrome: Secondary | ICD-10-CM

## 2019-11-02 DIAGNOSIS — E785 Hyperlipidemia, unspecified: Secondary | ICD-10-CM

## 2019-11-02 DIAGNOSIS — E559 Vitamin D deficiency, unspecified: Secondary | ICD-10-CM

## 2019-11-02 DIAGNOSIS — R7401 Elevation of levels of liver transaminase levels: Secondary | ICD-10-CM

## 2019-11-02 LAB — BASIC METABOLIC PANEL: Glucose: 110

## 2019-11-02 MED ORDER — VITAMIN D (ERGOCALCIFEROL) 1.25 MG (50000 UNIT) PO CAPS: ORAL_CAPSULE | ORAL | 3 refills | Status: DC

## 2019-11-02 NOTE — Progress Notes (Signed)
Virtual office visit note for [employer], D.O- Primary Care Physician at [facility]   I connected with current patient today and beyond visually recognizing the correct individual, I verified that I am speaking with the correct person using two identifiers.  . Location of the patient: Home . Location of the provider: Office Only the patient (+/- their family members at pt's discretion) and myself were participating in the encounter   - This visit type was conducted due to national recommendations for restrictions regarding the COVID-19 Pandemic (e.g. social distancing) in an effort to limit this patient's exposure and mitigate transmission in our community.  This format is felt to be most appropriate for this patient at this time.   - No physical exam could be performed with this format, beyond that communicated to [place] by the patient/ family members as noted.   - Additionally my office staff/ schedulers discussed with the patient that there may be a monetary charge related to this service, depending on patient's medical insurance.   The patient expressed understanding, and agreed to proceed.      History of Present Illness: Hypertension and Hyperlipidemia   I, [name], am serving as scribe for Dr. Mellody Dance.  Notes he's feeling well.  - Insomnia He continues taking Ambien daily.  "Nothing a little coffee can't help."  - Vitamin D Deficiency He has been taking 1000 IU's Vitamin D daily.  - Hydration Has been trying to drink more liquid and notes he "switched over to cranberry juice and that's all I drink now."  Notes he takes the concentrated juice and mixes it with water.  Says "it makes me want to drink more of it, and I heard that cranberry juice has good antioxidants."  - Elevated ALT Drinks alcohol seldom, doesn't take tylenol, and takes ibuprofen every so often.  HPI:  Hyperlipidemia:  54 y.o. male here for cholesterol follow-up.   - Patient  reports good compliance with treatment plan of:  medication and/ or lifestyle management.    Feels he will be able to get his LDL down below 70 in the future when he can exercise more.  - Patient denies any acute concerns or problems with management plan.  Continues half tablet of cholesterol medicine at night.  He takes fish oil to help improve his HDL.  - He denies new onset of: myalgias, arthralgias, increased fatigue more than normal, chest pains, exercise intolerance, shortness of breath, dizziness, visual changes, headache, lower extremity swelling or claudication.   Most recent cholesterol panel was:  Lab Results  Component Value Date   CHOL 138 10/20/2019   HDL 28 (L) 10/20/2019   LDLCALC 81 10/20/2019   TRIG 166 (H) 10/20/2019   CHOLHDL 4.9 10/20/2019   Hepatic Function Latest Ref Rng & Units 10/20/2019 03/03/2019 01/01/2018  Total Protein 6.0 - 8.5 g/dL 6.9 6.5 7.3  Albumin 3.8 - 4.9 g/dL 4.9 4.7 4.9  AST 0 - 40 IU/L 28 23 39  ALT 0 - 44 IU/L 79(H) 35 65(H)  Alk Phosphatase 39 - 117 IU/L 53 42 46  Total Bilirubin 0.0 - 1.2 mg/dL 1.0 0.9 0.6   HPI:   Diabetes Mellitus:  Home glucose readings:     - Patient reports good compliance with therapy plan: medication and/or lifestyle modification  - His denies acute concerns or problems related to treatment plan  - He denies new concerns.  Denies polyuria/polydipsia, hypo/ hyperglycemia symptoms.  Denies new onset of:  chest pain, exercise intolerance, shortness of breath, dizziness, visual changes, headache, lower extremity swelling or claudication.   Last A1C in the office was:  Lab Results  Component Value Date   HGBA1C 7.2 (H) 10/20/2019   HGBA1C 6.1 (H) 03/03/2019   HGBA1C 6.1 (A) 11/03/2018   Lab Results  Component Value Date   MICROALBUR 30 05/03/2018   LDLCALC 81 10/20/2019   CREATININE 0.89 10/20/2019   BP Readings from Last 3 Encounters:  03/22/19 110/70  11/03/18 114/78  08/12/18 109/72   Wt Readings from  Last 3 Encounters:  11/02/19 150 lb (68 kg)  10/12/19 150 lb (68 kg)  03/22/19 150 lb (68 kg)    Depression screen [facility] 2/9 11/02/2019 10/12/2019 03/22/2019 11/03/2018 08/12/2018  Decreased Interest 0 0 0 0 0  Down, Depressed, Hopeless 0 0 0 0 0  PHQ - 2 Score 0 0 0 0 0  Altered sleeping 0 0 0 0 0  Tired, decreased energy 0 0 0 0 2  Change in appetite 0 0 0 0 0  Feeling bad or failure about yourself  0 0 0 0 0  Trouble concentrating 0 0 0 0 0  Moving slowly or fidgety/restless 0 0 0 0 0  Suicidal thoughts 0 0 0 0 0  PHQ-9 Score 0 0 0 0 2  Difficult doing work/chores - - Not difficult at all Not difficult at all Not difficult at all    GAD 7 : Generalized Anxiety Score 03/22/2019  Nervous, Anxious, on Edge 0  Control/stop worrying 0  Worry too much - different things 0  Trouble relaxing 0  Restless 0  Easily annoyed or irritable 0  Afraid - awful might happen 0  Total GAD 7 Score 0  Anxiety Difficulty Not difficult at all     Impression and Recommendations:    1. Diet-controlled diabetes mellitus ([place])   2. Hyperlipidemia associated with type 2 diabetes mellitus ([place])   3. High triglycerides   4. Low HDL (under 40)   5. Insomnia, unspecified type   6. Chronic pain associated with significant psychosocial dysfunction   7. Vitamin D deficiency   8. Elevated ALT measurement      - Reviewed recent lab work (10/20/2019) in depth with patient today.  All lab work within normal limits unless otherwise noted.  Extensive education provided and all questions answered.   Elevated ALT - Discussed that patient's ALT is up to 79 from 35 prior, elevated.  - Discussed importance of avoiding hepatotoxic substances such as alcohol or tylenol. - Advised patient to eat beef or pork once per week. - Encouraged patient to eat chicken, [place], eggs, tofu instead of red meat.  - Will re-check in 3 months.   Vitamin D Deficiency - Vitamin D last measured at 25.6, low. - Reviewed goal of  maintenance in 40-60 range. - Begin weekly Vitamin D prescription.  See med list.  - Will continue to monitor and re-check as discussed.    Diet-Controlled DM - A1c up to 7.2, poorly controlled at this time. - Discussed goal in the 6.0-6.5 range.  Patient's A1c was maintained at 6.1 prior.  - Patient knows to monitor for symptoms.  - Recommended working intensively on diet and exercise, or beginning low-dose medication to help reduce patient's A1c.  Patient wishes to work on intensive lifestyle modification.  - Counseled patient on pathophysiology of disease and discussed various treatment options, which always includes dietary and lifestyle modification as first line.    -  Importance of low carb, heart-healthy diet discussed with patient in addition to regular aerobic exercise of 10mn 5d/week or more.   - Check FBS and 2 hours after the biggest meal of your day.  Keep log and bring in next OV for my review.     - Also told patient if you ever feel poorly, please check your blood pressure and blood sugar, as one or the other could be the cause of your symptoms.  - Pt reminded about need for yearly eye and foot exams.  Told patient to make appt.for diabetic eye exam, CMAs here will do foot exams  - We will continue to monitor and re-check in 3 months.    Hyperlipidemia associated with DM, high triglycerides, low HDL  Last FLP obtained 13 days ago (patient was NOT fasting): - LDL = 81, down from 119 eight months ago, 139 a year prior. - HDL = 28, low, unchanged from prior. - Triglycerides = 166, elevated, up from 144 prior.  - Cholesterol is improved.  - Pt will continue current treatment regimen. - Agrees to increase to a whole tablet in future if needed.  - Dietary changes such as low saturated & trans fat diets for hyperlipidemia and low carb/ ketogenic diets for hypertriglyceridemia discussed with patient.    - Encouraged patient to follow AHA guidelines for regular  exercise and also engage in weight loss if BMI above 25.   - We will continue to monitor and re-check 3 mo.   If LDL not improved to goal of less than 70, will adjust Lipitor to 10 mg nightly.    Health Counseling & Preventative Maintenance - Advised patient to continue working toward exercising to improve overall mental, physical, and emotional health.    - Reviewed the "spokes of the wheel" of mood and health management.  Stressed the importance of ongoing prudent habits, including regular exercise, appropriate sleep hygiene, healthful dietary habits, and prayer/meditation to relax.  - Encouraged patient to engage in daily physical activity, especially a formal exercise routine.  Recommended that the patient eventually strive for at least 150 minutes of moderate cardiovascular activity per week according to guidelines established by the [facility]   - Healthy dietary habits encouraged, including low-carb, and high amounts of lean protein in diet.   - Patient should also consume adequate amounts of water.  - Health counseling performed.  All questions answered.    Insomnia - Stable/controlled on Ambien management.  Understands risks of medicine and he hopes to get off but, thinks the benefits outweigh the risks at this time.- Encouraged patient to engage in sleep meditation to improve sleep hygiene. - Discussed prudent lifestyle changes. - Will continue to monitor.    Chronic pain associated with significant psychosocial dysfunction and Decreased Activity as of late  - Per patient, symptoms stable at this time. - Continue management as established.  Will continue to monitor. - Encouraged patient to continue prudent lifestyle habits. - Will continue to monitor alongside specialist.    Recommendations  - Re-check A1c, FLP, ALT, and Vitamin D in 3 months, OV to follow 3 d later.   - As part of my medical decision making, I reviewed the following data within the electronic medical  record:  History obtained from pt /family, CMA notes reviewed and incorporated if applicable, Labs reviewed, Radiograph/ tests reviewed if applicable and OV notes from prior OV's with me, as well as other specialists she/he has seen since seeing me last, were all reviewed and  used in my medical decision making process today.   - Additionally, discussion had with patient regarding txmnt plan, their biases about that plan etc were used in my medical decision making today.   - The patient agreed with the plan and demonstrated an understanding of the instructions.   No barriers to understanding were identified.    - Red flag symptoms and signs discussed in detail.  Patient expressed understanding regarding what to do in case of emergency\ urgent symptoms.  The patient was advised to call back or seek an in-person evaluation if the symptoms worsen or if the condition fails to improve as anticipated.   Return for re-check labs A1c, FLP, ALT, Vitamin D, OV 3-5 days later..   Orders Placed This Encounter  Procedures  . ALT  . Lipid panel  . Hemoglobin A1c  . VITAMIN D 25 Hydroxy (Vit-D Deficiency, Fractures)    Meds ordered this encounter  Medications  . Vitamin D, Ergocalciferol, (DRISDOL) 1.25 MG (50000 UNIT) CAPS capsule    Sig: Take one tablet wkly    Dispense:  12 capsule    Refill:  3    Note:  This note was prepared with assistance of Dragon voice recognition software. Occasional wrong-word or sound-a-like substitutions may have occurred due to the inherent limitations of voice recognition software.   This document serves as a record of services personally performed by Mellody Dance, DO. It was created on her behalf by [name], a trained medical scribe. The creation of this record is based on the scribe's personal observations and the provider's statements to them.   This case required medical decision making of at least moderate complexity. The above documentation has been  reviewed to be accurate and was completed by Marjory Sneddon, D.O.       Patient Care Team    Relationship Specialty Notifications Start End  Mellody Dance, DO PCP - General Family Medicine  11/05/16   Carlyle Dolly, MD Resident Family Medicine All results, Admissions 10/03/15   [name], Kevan Ny, MD Consulting Physician Neurosurgery  11/05/16   Marlaine Hind, MD Consulting Physician Physical Medicine and Rehabilitation  11/05/16    Comment: pain Doc  [place], [name], [place]  Optometry  05/03/18     -Vitals obtained; medications/ allergies reconciled;  personal medical, social, Sx etc.histories were updated by CMA, reviewed by me and are reflected in chart  Patient Active Problem List   Diagnosis Date Noted  . Low HDL (under 40) 11/05/2016  . Diet-controlled diabetes mellitus ([place]) 11/05/2016  . Status post colonoscopy 2008 11/05/2016  . Insomnia 09/28/2015  . Chronic pain associated with significant psychosocial dysfunction 09/05/2015  . Status post lumbar spinal fusion 11/05/2016  . Not currently working due to disabled status 11/05/2016  . Status post cervical spinal fusion 11/05/2016  . Long-term use of high-risk medication 11/05/2016  . Muscle spasms of neck, upper back & lower back 11/05/2016  . High triglycerides 03/22/2019  . Dizziness 08/12/2018  . Orthostatic hypotension 08/12/2018  . Dehydration, mild 08/12/2018  . Vitamin D deficiency 01/01/2018  . Hyperlipidemia associated with type 2 diabetes mellitus ([place]) 01/01/2018  . Low testosterone level in male 01/01/2018  . Osteoarthritis of spine with radiculopathy, cervical region 09/01/2016  . DDD (degenerative disc disease), lumbar 09/28/2015  . DDD (degenerative disc disease), cervical 09/05/2015  . Chronic pain syndrome 09/05/2015  . Cervical stenosis of spine 09/05/2015  . Cervical pain (neck) 08/07/2015     Current Meds  Medication Sig  .  atorvastatin (LIPITOR) 10 MG tablet Take 0.5 tablets (5 mg  total) by mouth at bedtime.  . cholecalciferol (VITAMIN D3) 25 MCG (1000 UT) tablet Take 1,000 Units by mouth daily.  . nortriptyline (PAMELOR) 10 MG capsule Take 20 mg by mouth at bedtime.   Marland Kitchen omega-3 acid ethyl esters (LOVAZA) 1 g capsule Take 2 capsules (2 g total) by mouth 2 (two) times daily.  . traMADol (ULTRAM) 50 MG tablet Take by mouth 3 (three) times daily.  . TURMERIC PO Take 1 tablet by mouth daily.  Marland Kitchen zolpidem (AMBIEN) 10 MG tablet Take 1 tablet (10 mg total) by mouth at bedtime as needed for sleep.     Allergies  Allergen Reactions  . Trazodone And Nefazodone Other (See Comments)    Passes out     ROS:  See above HPI for pertinent positives and negatives   Objective:   Temperature 97.6 F (36.4 C), height _0  (1.676 m), weight 150 lb (68 kg).  (if some vitals are omitted, this means that patient was UNABLE to obtain them even though they were asked to get them prior to OV today.  They were asked to call us at their earliest convenience with these once obtained.)  General: A & O * 3; visually in no acute distress; in usual state of health.  Skin: Visible skin appears normal and pt's usual skin color HEENT:  EOMI, head is normocephalic and atraumatic.  Sclera are anicteric. Neck has a good range of motion.  Lips are noncyanotic Chest: normal chest excursion and movement Respiratory: speaking in full sentences, no conversational dyspnea; no use of accessory muscles Psych: insight good, mood- appears full

## 2020-01-02 ENCOUNTER — Telehealth: Payer: Self-pay | Admitting: Family Medicine

## 2020-01-02 NOTE — Telephone Encounter (Signed)
Patient requesting a refill of his Ambien, if approved please send to [facility] on [address]

## 2020-01-02 NOTE — Telephone Encounter (Signed)
Last seen 11/02/19 and advised Return for re-check labs A1c, FLP, ALT, Vitamin D, OV 3-5 days later..  Last refill given 10/12/19 #90 with 1 refill.  Called pharmacy and spoke with Jone Baseman who states refill has not been filled yet and can not be filled until 01/04/20. Advised patient of the above and he verbalized understanding. AS, CMA

## 2020-01-27 ENCOUNTER — Other Ambulatory Visit: Payer: Self-pay

## 2020-01-27 ENCOUNTER — Other Ambulatory Visit: Payer: Managed Care, Other (non HMO)

## 2020-01-27 DIAGNOSIS — E119 Type 2 diabetes mellitus without complications: Secondary | ICD-10-CM

## 2020-01-27 DIAGNOSIS — E1169 Type 2 diabetes mellitus with other specified complication: Secondary | ICD-10-CM

## 2020-01-27 DIAGNOSIS — E559 Vitamin D deficiency, unspecified: Secondary | ICD-10-CM

## 2020-01-27 DIAGNOSIS — E785 Hyperlipidemia, unspecified: Secondary | ICD-10-CM

## 2020-01-27 DIAGNOSIS — E781 Pure hyperglyceridemia: Secondary | ICD-10-CM

## 2020-01-27 DIAGNOSIS — E786 Lipoprotein deficiency: Secondary | ICD-10-CM

## 2020-01-27 DIAGNOSIS — R7401 Elevation of levels of liver transaminase levels: Secondary | ICD-10-CM

## 2020-01-28 LAB — LIPID PANEL
Chol/HDL Ratio: 3.1 ratio (ref 0.0–5.0)
Cholesterol, Total: 96 mg/dL — ABNORMAL LOW (ref 100–199)
HDL: 31 mg/dL — ABNORMAL LOW (ref >39)
LDL Chol Calc (NIH): 50 mg/dL (ref 0–99)
Triglycerides: 69 mg/dL (ref 0–149)
VLDL Cholesterol Cal: 15 mg/dL (ref 5–40)

## 2020-01-28 LAB — VITAMIN D 25 HYDROXY (VIT D DEFICIENCY, FRACTURES): Vit D, 25-Hydroxy: 63.9 ng/mL (ref 30.0–100.0)

## 2020-01-28 LAB — HEMOGLOBIN A1C
Est. average glucose Bld gHb Est-mCnc: 146 mg/dL
Hgb A1c MFr Bld: 6.7 % — ABNORMAL HIGH (ref 4.8–5.6)

## 2020-01-28 LAB — ALT: ALT: 34 IU/L (ref 0–44)

## 2020-01-31 ENCOUNTER — Encounter: Payer: Self-pay | Admitting: Family Medicine

## 2020-01-31 ENCOUNTER — Ambulatory Visit: Payer: Managed Care, Other (non HMO) | Admitting: Adult Health

## 2020-01-31 ENCOUNTER — Telehealth (INDEPENDENT_AMBULATORY_CARE_PROVIDER_SITE_OTHER): Payer: Managed Care, Other (non HMO) | Admitting: Family Medicine

## 2020-01-31 ENCOUNTER — Other Ambulatory Visit: Payer: Self-pay

## 2020-01-31 VITALS — Temp 98.2°F | Ht 65.0 in | Wt 155.0 lb

## 2020-01-31 DIAGNOSIS — E559 Vitamin D deficiency, unspecified: Secondary | ICD-10-CM

## 2020-01-31 DIAGNOSIS — E786 Lipoprotein deficiency: Secondary | ICD-10-CM | POA: Diagnosis not present

## 2020-01-31 DIAGNOSIS — E781 Pure hyperglyceridemia: Secondary | ICD-10-CM

## 2020-01-31 DIAGNOSIS — E1169 Type 2 diabetes mellitus with other specified complication: Secondary | ICD-10-CM | POA: Diagnosis not present

## 2020-01-31 DIAGNOSIS — E119 Type 2 diabetes mellitus without complications: Secondary | ICD-10-CM | POA: Diagnosis not present

## 2020-01-31 DIAGNOSIS — G4700 Insomnia, unspecified: Secondary | ICD-10-CM

## 2020-01-31 DIAGNOSIS — E785 Hyperlipidemia, unspecified: Secondary | ICD-10-CM

## 2020-01-31 LAB — BASIC METABOLIC PANEL: Glucose: 115

## 2020-01-31 MED ORDER — VITAMIN D (ERGOCALCIFEROL) 1.25 MG (50000 UNIT) PO CAPS: ORAL_CAPSULE | ORAL | 3 refills | Status: DC

## 2020-01-31 NOTE — Progress Notes (Signed)
Telehealth office visit note for Richard Noble, D.O- at Primary Care at [facility]   I connected with current patient today and verified that I am speaking with the correct person   . Location of the patient: Home . Location of the provider: Office - This visit type was conducted due to national recommendations for restrictions regarding the COVID-19 Pandemic (e.g. social distancing) in an effort to limit this patient's exposure and mitigate transmission in our community.    - No physical exam could be performed with this format, beyond that communicated to [place] by the patient/ family members as noted.   - Additionally my office staff/ schedulers were to discuss with the patient that there may be a monetary charge related to this service, depending on their medical insurance.  My understanding is that patient understood and consented to proceed.     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  Since last OV, notes he's been "watching a little more what I eat," and can tell a difference in how he feels.  He's also trying to wean himself off of tramadol, taking half of a pill for a month, and then another half of a pill, etc.  - Vitamin D Continues once weekly vitamin D supplementation.  He is not taking Vitamin D daily.  - Blood Pressure Denies concerns with his blood pressure.  - COVID-19 Vaccination Received his second COVID-19 vaccination yesterday.  - Insomnia He continues Ambien.  Says "as soon as I'm done with the tramadol, Ambien is my next aim to get off of."  He currently takes Ambien daily.  HPI:   Diabetes Mellitus:  Home glucose readings: reports fasting sugars in the 110, 105 range, "something like that."   - Patient reports good compliance with therapy plan: medication and/or lifestyle modification  - His denies acute  concerns or problems related to treatment plan  - He denies new concerns.  Denies polyuria/polydipsia, hypo/ hyperglycemia symptoms.  Denies new onset of: chest pain, exercise intolerance, shortness of breath, dizziness, visual changes, headache, lower extremity swelling or claudication.   Last A1C in the office was:  Lab Results  Component Value Date   HGBA1C 6.7 (H) 01/27/2020   HGBA1C 7.2 (H) 10/20/2019   HGBA1C 6.1 (H) 03/03/2019   Lab Results  Component Value Date   MICROALBUR 30 05/03/2018   LDLCALC 50 01/27/2020   CREATININE 0.89 10/20/2019   BP Readings from Last 3 Encounters:  03/22/19 110/70  11/03/18 114/78  08/12/18 109/72   Wt Readings from Last 3 Encounters:  01/31/20 155 lb (70.3 kg)  11/02/19 150 lb (68 kg)  10/12/19 150 lb (68 kg)    HPI:  Hyperlipidemia:  54 y.o. male here for cholesterol follow-up.   - Patient reports good compliance with treatment plan of:  medication and/ or lifestyle management.  Continues half of a tablet before bedtime.  - Patient denies any acute concerns or problems with management plan.  - He denies new onset of: myalgias, arthralgias, increased fatigue more than normal, chest pains, exercise intolerance, shortness of breath, dizziness, visual changes, headache, lower extremity swelling or claudication.   Most recent cholesterol panel was:  Lab Results  Component Value Date   CHOL 96 (L) 01/27/2020   HDL 31 (L) 01/27/2020   LDLCALC 50 01/27/2020   TRIG 69 01/27/2020   CHOLHDL 3.1 01/27/2020   Hepatic Function Latest Ref Rng &  Units 01/27/2020 10/20/2019 03/03/2019  Total Protein 6.0 - 8.5 g/dL - 6.9 6.5  Albumin 3.8 - 4.9 g/dL - 4.9 4.7  AST 0 - 40 IU/L - 28 23  ALT 0 - 44 IU/L 34 79(H) 35  Alk Phosphatase 39 - 117 IU/L - 53 42  Total Bilirubin 0.0 - 1.2 mg/dL - 1.0 0.9       GAD 7 : Generalized Anxiety Score 03/22/2019  Nervous, Anxious, on Edge 0  Control/stop worrying 0  Worry too much - different things 0   Trouble relaxing 0  Restless 0  Easily annoyed or irritable 0  Afraid - awful might happen 0  Total GAD 7 Score 0  Anxiety Difficulty Not difficult at all    Depression screen [facility] 2/9 01/31/2020 11/02/2019 10/12/2019 03/22/2019 11/03/2018  Decreased Interest 0 0 0 0 0  Down, Depressed, Hopeless 0 0 0 0 0  PHQ - 2 Score 0 0 0 0 0  Altered sleeping 1 0 0 0 0  Tired, decreased energy 0 0 0 0 0  Change in appetite 0 0 0 0 0  Feeling bad or failure about yourself  0 0 0 0 0  Trouble concentrating 0 0 0 0 0  Moving slowly or fidgety/restless 0 0 0 0 0  Suicidal thoughts 0 0 0 0 0  PHQ-9 Score 1 0 0 0 0  Difficult doing work/chores Not difficult at all - - Not difficult at all Not difficult at all  Some recent data might be hidden      Impression and Recommendations:     1. Diet-controlled diabetes mellitus ([place])   2. Hyperlipidemia associated with type 2 diabetes mellitus ([place])   3. High triglycerides   4. Low HDL (under 40)   5. Vitamin D deficiency   6. Insomnia, unspecified type       Diet-controlled Diabetes Mellitus - Last A1c measured four days ago at 6.7, down from 7.2 three months ago.  - A1c stable and improving at this time. - Discussed goal of under 7.0, ideally in the 6.0 to 6.5 range.  - If A1c is not under 6.5 next office visit in three months, recommend starting metformin.  - Counseled patient on pathophysiology of disease and discussed various treatment options, which always includes dietary and lifestyle modification as first line.    - Importance of low carb, heart-healthy diet discussed with patient in addition to regular aerobic exercise of 22mn 5d/week or more.   - Patient will continue with lifestyle modification goals.  - Check FBS and 2 hours after the biggest meal of your day.  Keep log and bring in next OV for my review.     - Also told patient if you ever feel poorly, please check your blood pressure and blood sugar, as one or the other could  be the cause of your symptoms.  - Pt reminded about need for yearly eye and foot exams.  Told patient to make appt.for diabetic eye exam, CMAs here will do foot exams  - We will continue to monitor.   Hyperlipidemia associated with Type 2 DM, High Triglycerides, Low HDL (under 40) - Last FLP obtained 4 days ago.  Triglycerides = 69, down from 166 three months ago. HDL = 31, up from 28 three months ago. LDL = 50, down from 81 three months ago.  - LDL at goal less than 70. - Discussed that cholesterol levels are currently at goal.  - Pt will continue current  treatment regimen.  See med list.  - Prudent dietary changes such as low saturated & trans fat diets for hyperlipidemia and low carb/ ketogenic diets for hypertriglyceridemia discussed with patient.    Encouraged patient to follow AHA guidelines for regular exercise and also engage in weight loss if BMI above 25.   Educational handouts provided at patient's desire and/ or told to look online at the [employer] website for further information.  We will continue to monitor.   Vitamin D Deficiency - Up to 63.9 from 25.6 three months ago, currently at goal.  - Advised patient to take one tablet of Vitamin D every 10 days.  See med list.  - Will continue to monitor and re-check as discussed.   Insomnia - Currently managed on Ambien.  - Stable at this time. - Continue management as established.  See med list.  - Per patient, plans to wean off of this medication in the future.  - Will continue to monitor.   - The patient agreed with the plan and demonstrated an understanding of the instructions.   No barriers to understanding were identified.     - The patient was advised to call back or seek an in-person evaluation if the symptoms worsen or if the condition fails to improve as anticipated.   Return for DM, HTN, HLD follow up every 63mo    Meds ordered this encounter  Medications  . Vitamin D,  Ergocalciferol, (DRISDOL) 1.25 MG (50000 UNIT) CAPS capsule    Sig: Take one tablet every 10 days    Dispense:  12 capsule    Refill:  3    Medications Discontinued During This Encounter  Medication Reason  . cholecalciferol (VITAMIN D3) 25 MCG (1000 UT) tablet   . Vitamin D, Ergocalciferol, (DRISDOL) 1.25 MG (50000 UNIT) CAPS capsule       Time spent on visit including pre-visit chart review and post-visit care was 13 minutes.   Note:  This note was prepared with assistance of Dragon voice recognition software. Occasional wrong-word or sound-a-like substitutions may have occurred due to the inherent limitations of voice recognition software.  The [place] signed into law in 2016 which includes the topic of electronic health records.  This provides immediate access to information in MyChart.  This includes consultation notes, operative notes, office notes, lab results and pathology reports.  If you have any questions about what you read please let u[place] at your next visit or call u[place]at the office.  We are right here with you.  This document serves as a record of services personally performed by DMellody Dance DO. It was created on her behalf by KToni Noble a trained medical scribe. The creation of this record is based on the scribe's personal observations and the provider's statements to them.    The above documentation from KToni Noble medical scribe, has been reviewed by DMarjory Sneddon D.O. __________________________________________________________________________________   Patient Care Team    Relationship Specialty Notifications Start End  OMellody Dance DO PCP - General Family Medicine  11/05/16   GCarlyle Dolly MD Resident Family Medicine All results, Admissions 10/03/15   [name], BKevan Ny MD Consulting Physician Neurosurgery  11/05/16   BMarlaine Hind MD Consulting Physician Physical Medicine and Rehabilitation  11/05/16     Comment: pain Doc  [name], [facility]  05/03/18      -Vitals obtained; medications/ allergies reconciled;  personal medical, social, Sx etc.histories were updated  by CMA, reviewed by me and are reflected in chart   Patient Active Problem List   Diagnosis Date Noted  . Hyperlipidemia associated with type 2 diabetes mellitus ([place]) 01/01/2018  . Low HDL (under 40) 11/05/2016  . Diet-controlled diabetes mellitus ([place]) 11/05/2016  . Status post colonoscopy 2008 11/05/2016  . Insomnia 09/28/2015  . Chronic pain associated with significant psychosocial dysfunction 09/05/2015  . Status post lumbar spinal fusion 11/05/2016  . Not currently working due to disabled status 11/05/2016  . Status post cervical spinal fusion 11/05/2016  . Long-term use of high-risk medication 11/05/2016  . Muscle spasms of neck, upper back & lower back 11/05/2016  . High triglycerides 03/22/2019  . Dizziness 08/12/2018  . Orthostatic hypotension 08/12/2018  . Dehydration, mild 08/12/2018  . Vitamin D deficiency 01/01/2018  . Low testosterone level in male 01/01/2018  . Osteoarthritis of spine with radiculopathy, cervical region 09/01/2016  . DDD (degenerative disc disease), lumbar 09/28/2015  . DDD (degenerative disc disease), cervical 09/05/2015  . Chronic pain syndrome 09/05/2015  . Cervical stenosis of spine 09/05/2015  . Cervical pain (neck) 08/07/2015     Current Meds  Medication Sig  . atorvastatin (LIPITOR) 10 MG tablet Take 0.5 tablets (5 mg total) by mouth at bedtime.  . nortriptyline (PAMELOR) 10 MG capsule Take 20 mg by mouth at bedtime.   Marland Kitchen omega-3 acid ethyl esters (LOVAZA) 1 g capsule Take 2 capsules (2 g total) by mouth 2 (two) times daily.  . traMADol (ULTRAM) 50 MG tablet Take by mouth 3 (three) times daily.  . TURMERIC PO Take 1 tablet by mouth daily.  . Vitamin D, Ergocalciferol, (DRISDOL) 1.25 MG (50000 UNIT) CAPS capsule Take one tablet every 10 days  . zolpidem (AMBIEN)  10 MG tablet Take 1 tablet (10 mg total) by mouth at bedtime as needed for sleep.  . [DISCONTINUED] cholecalciferol (VITAMIN D3) 25 MCG (1000 UT) tablet Take 1,000 Units by mouth daily.  . [DISCONTINUED] Vitamin D, Ergocalciferol, (DRISDOL) 1.25 MG (50000 UNIT) CAPS capsule Take one tablet wkly     Allergies:  Allergies  Allergen Reactions  . Trazodone And Nefazodone Other (See Comments)    Passes out     ROS:  See above HPI for pertinent positives and negatives   Objective:   Temperature 98.2 F (36.8 C), height _0  (1.651 m), weight 155 lb (70.3 kg).  (if some vitals are omitted, this means that patient was UNABLE to obtain them even though they were asked to get them prior to OV today.  They were asked to call us at their earliest convenience with these once obtained. ) General: A & O * 3; sounds in no acute distress; in usual state of health.  Skin: Pt confirms warm and dry extremities and pink fingertips HEENT: Pt confirms lips non-cyanotic Chest: Patient confirms normal chest excursion and movement Respiratory: speaking in full sentences, no conversational dyspnea; patient confirms no use of accessory muscles Psych: insight appears good, mood- appears full

## 2020-02-08 ENCOUNTER — Other Ambulatory Visit: Payer: Self-pay

## 2020-02-08 ENCOUNTER — Ambulatory Visit: Payer: 59 | Admitting: Podiatry

## 2020-02-08 ENCOUNTER — Encounter: Payer: Self-pay | Admitting: Podiatry

## 2020-02-08 VITALS — Temp 97.6°F

## 2020-02-08 DIAGNOSIS — B351 Tinea unguium: Secondary | ICD-10-CM | POA: Diagnosis not present

## 2020-02-08 MED ORDER — TERBINAFINE HCL 250 MG PO TABS: 250.0000 mg | ORAL_TABLET | Freq: Every day | ORAL | 0 refills | Status: DC

## 2020-02-08 NOTE — Progress Notes (Signed)
Subjective:   Patient ID: Richard Noble, male   DOB: 54 y.o.   MRN: [id number]   HPI Patient presents stating he is got some yellow discoloration of his nails that is localized and states that he is try topical which is not really helped him.  Patient does not smoke likes to be active and states the nails can be bothersome at times   Review of Systems  All other systems reviewed and are negative.       Objective:  Physical Exam Vitals and nursing note reviewed.  Constitutional:      Appearance: He is well-developed.  Pulmonary:     Effort: Pulmonary effort is normal.  Musculoskeletal:        General: Normal range of motion.  Skin:    General: Skin is warm.  Neurological:     Mental Status: He is alert.     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  I have recommended a combination of oral medicine with patient having liver function studies it will be done in conjunction with the laser and continue topical.  Educated him on all these conditions and what to do.  Discussed ingrown's do not recommend correction currently

## 2020-02-14 LAB — HEPATIC FUNCTION PANEL
AG Ratio: 2.2 (calc) (ref 1.0–2.5)
ALT: 35 U/L (ref 9–46)
AST: 24 U/L (ref 10–35)
Albumin: 4.8 g/dL (ref 3.6–5.1)
Alkaline phosphatase (APISO): 44 U/L (ref 35–144)
Bilirubin, Direct: 0.2 mg/dL (ref 0.0–0.2)
Globulin: 2.2 g/dL (calc) (ref 1.9–3.7)
Indirect Bilirubin: 0.7 mg/dL (calc) (ref 0.2–1.2)
Total Bilirubin: 0.9 mg/dL (ref 0.2–1.2)
Total Protein: 7 g/dL (ref 6.1–8.1)

## 2020-03-09 ENCOUNTER — Ambulatory Visit (INDEPENDENT_AMBULATORY_CARE_PROVIDER_SITE_OTHER): Payer: 59 | Admitting: *Deleted

## 2020-03-09 ENCOUNTER — Other Ambulatory Visit: Payer: Self-pay

## 2020-03-09 DIAGNOSIS — B351 Tinea unguium: Secondary | ICD-10-CM

## 2020-03-09 NOTE — Progress Notes (Signed)
Patient presents today for the 1st laser treatment. Diagnosed with mycotic nail infection by Dr. Paulla Dolly.   Toenail most affected 1st, 4th, 5th bilateral.  All other systems are negative.  Nails were filed thin. Laser therapy was administered to 1-5 toenails bilateral and patient tolerated the treatment well. All safety precautions were in place.   Patient was also prescribed oral terbinafine on 4/28. He is awaiting blood work results. I did review his labs today and informed him they were normal and could go ahead and start the medication.  Follow up in 4 weeks for laser # 2.  Picture of nails taken today to document visual progress

## 2020-03-09 NOTE — Patient Instructions (Signed)

## 2020-04-06 ENCOUNTER — Ambulatory Visit (INDEPENDENT_AMBULATORY_CARE_PROVIDER_SITE_OTHER): Payer: 59 | Admitting: *Deleted

## 2020-04-06 ENCOUNTER — Other Ambulatory Visit: Payer: Self-pay

## 2020-04-06 DIAGNOSIS — B351 Tinea unguium: Secondary | ICD-10-CM

## 2020-04-06 NOTE — Progress Notes (Signed)
Patient presents today for the 2nd laser treatment. Diagnosed with mycotic nail infection by Dr. Paulla Dolly.   Toenail most affected 1st, 4th, 5th bilateral.  All other systems are negative.  Nails were filed thin. Laser therapy was administered to 1-5 toenails bilateral and patient tolerated the treatment well. All safety precautions were in place.   Patient started terbinafine on 03/09/20 and has taken about 30 days of prescribed 90.  Follow up in 4 weeks for laser # 3  ~Start intervals at 6 weeks for treatment #4~

## 2020-04-11 ENCOUNTER — Other Ambulatory Visit: Payer: Self-pay | Admitting: Family Medicine

## 2020-04-11 DIAGNOSIS — E786 Lipoprotein deficiency: Secondary | ICD-10-CM

## 2020-04-11 DIAGNOSIS — E781 Pure hyperglyceridemia: Secondary | ICD-10-CM

## 2020-04-11 DIAGNOSIS — E1169 Type 2 diabetes mellitus with other specified complication: Secondary | ICD-10-CM

## 2020-04-18 ENCOUNTER — Other Ambulatory Visit: Payer: Self-pay | Admitting: Podiatry

## 2020-04-18 ENCOUNTER — Telehealth: Payer: Self-pay | Admitting: Physician Assistant

## 2020-04-18 DIAGNOSIS — E786 Lipoprotein deficiency: Secondary | ICD-10-CM

## 2020-04-18 DIAGNOSIS — E781 Pure hyperglyceridemia: Secondary | ICD-10-CM

## 2020-04-18 DIAGNOSIS — E1169 Type 2 diabetes mellitus with other specified complication: Secondary | ICD-10-CM

## 2020-04-18 MED ORDER — ATORVASTATIN CALCIUM 10 MG PO TABS: 5.0000 mg | ORAL_TABLET | Freq: Every day | ORAL | 0 refills | Status: DC

## 2020-04-18 NOTE — Addendum Note (Signed)
Addended by: Fonnie Mu on: 04/18/2020 10:33 AM   Modules accepted: Orders

## 2020-04-18 NOTE — Telephone Encounter (Signed)
Patient is requesting a refill of his atorvastatin, if approved please send to order to [facility] on [address].

## 2020-05-01 ENCOUNTER — Telehealth: Payer: Self-pay | Admitting: Physician Assistant

## 2020-05-01 DIAGNOSIS — E1169 Type 2 diabetes mellitus with other specified complication: Secondary | ICD-10-CM

## 2020-05-01 DIAGNOSIS — E786 Lipoprotein deficiency: Secondary | ICD-10-CM

## 2020-05-01 DIAGNOSIS — E781 Pure hyperglyceridemia: Secondary | ICD-10-CM

## 2020-05-01 DIAGNOSIS — E785 Hyperlipidemia, unspecified: Secondary | ICD-10-CM

## 2020-05-01 NOTE — Telephone Encounter (Signed)
Patient is requesting a refill of his atorvastatin, if approved please send to [facility] on [address].

## 2020-05-02 MED ORDER — ATORVASTATIN CALCIUM 10 MG PO TABS: 5.0000 mg | ORAL_TABLET | Freq: Every day | ORAL | 0 refills | Status: DC

## 2020-05-02 NOTE — Addendum Note (Signed)
Addended by: Mickel Crow on: 05/02/2020 08:10 AM   Modules accepted: Orders

## 2020-05-02 NOTE — Telephone Encounter (Signed)
Refill sent to requested pharmacy. AS, CMA 

## 2020-05-04 ENCOUNTER — Other Ambulatory Visit: Payer: 59

## 2020-05-11 ENCOUNTER — Other Ambulatory Visit: Payer: Self-pay

## 2020-05-11 ENCOUNTER — Ambulatory Visit (INDEPENDENT_AMBULATORY_CARE_PROVIDER_SITE_OTHER): Payer: 59 | Admitting: *Deleted

## 2020-05-11 DIAGNOSIS — B351 Tinea unguium: Secondary | ICD-10-CM

## 2020-05-11 NOTE — Progress Notes (Signed)
Patient presents today for the 3rd laser treatment. Diagnosed with mycotic nail infection by Dr. Paulla Dolly.   Toenail most affected 1st, 4th, 5th bilateral. He is very happy with the progress his nails has made.  All other systems are negative.  Nails were filed thin. Laser therapy was administered to 1-5 toenails bilateral and patient tolerated the treatment well. All safety precautions were in place.   Patient started terbinafine on 03/09/20. He has about 45 pills left.   Follow up in 6 weeks for laser # 4.

## 2020-05-14 ENCOUNTER — Ambulatory Visit (INDEPENDENT_AMBULATORY_CARE_PROVIDER_SITE_OTHER): Payer: Managed Care, Other (non HMO) | Admitting: Physician Assistant

## 2020-05-14 ENCOUNTER — Encounter: Payer: Self-pay | Admitting: Physician Assistant

## 2020-05-14 ENCOUNTER — Other Ambulatory Visit: Payer: Self-pay

## 2020-05-14 VITALS — BP 111/73 | HR 70 | Temp 98.4°F | Ht 66.0 in | Wt 147.0 lb

## 2020-05-14 DIAGNOSIS — E785 Hyperlipidemia, unspecified: Secondary | ICD-10-CM

## 2020-05-14 DIAGNOSIS — E119 Type 2 diabetes mellitus without complications: Secondary | ICD-10-CM | POA: Diagnosis not present

## 2020-05-14 DIAGNOSIS — E559 Vitamin D deficiency, unspecified: Secondary | ICD-10-CM | POA: Diagnosis not present

## 2020-05-14 DIAGNOSIS — E781 Pure hyperglyceridemia: Secondary | ICD-10-CM

## 2020-05-14 DIAGNOSIS — E1169 Type 2 diabetes mellitus with other specified complication: Secondary | ICD-10-CM | POA: Diagnosis not present

## 2020-05-14 LAB — POCT UA - MICROALBUMIN
Albumin/Creatinine Ratio, Urine, POC: <30
Creatinine, POC: 100 mg/dL
Microalbumin Ur, POC: 30 mg/L

## 2020-05-14 LAB — POCT GLYCOSYLATED HEMOGLOBIN (HGB A1C): Hemoglobin A1C: 6.8 % — AB (ref 4.0–5.6)

## 2020-05-14 MED ORDER — METFORMIN HCL 500 MG PO TABS: 500.0000 mg | ORAL_TABLET | Freq: Every day | ORAL | 1 refills | Status: DC

## 2020-05-14 NOTE — Assessment & Plan Note (Addendum)
-   A1c today is 6.8 , slight increase from prior - Patient agreeable to starting low-dose of Metformin. - Continue to follow low glucose/carbohydrate diet and exercise regimen - Foot exam performed today, wnl - UA microalbumin normal.

## 2020-05-14 NOTE — Progress Notes (Signed)
Established Patient Office Visit  Subjective:  Patient ID: Richard Noble, male    DOB: [birth year]  Age: 54 y.o. MRN: [id number]  CC:  Chief Complaint  Patient presents with  . Diabetes  . Hypertension  . Hyperlipidemia    HPI Richard Noble presents for follow-up on diabetes mellitus, hyperlipidemia, and vitamin D deficiency.  Diabetes mellitus, diet controlled: Pt reports his tries to monitor his carbohydrates. Sometimes cehcks glucose at home but usually forgets. Reports he would be interested in Dexcom sensor. He stays active with swimming.   Hyperlipidemia, hypertriglyceridemia: Pt taking medication as directed without issues. Denies side effects including myalgias and RUQ pain.   Vitamin D: Reports compliance with vitamin D supplement every 10 days.  Past Medical History:  Diagnosis Date  . Anxiety    uses valium for anxiety related to pain.   . Arthritis    stenosis, cervical area, arthritis  - spine   . Diabetes mellitus without complication ([place])    treated with diet only  . History of exercise stress test     in [place] state [place]rs[place] ago had stress test & he was followed by cardiologist for a couple yrs. , but hasn't been referred since he has lived here.   Marland Kitchen History of kidney stones    found incidentally - no problems, just stable   . Hyperlipidemia    borderline no meds  . Sleep concern    study done in [place], told that it was normal, sleep issue related to pain.      Past Surgical History:  Procedure Laterality Date  . ANTERIOR CERVICAL DECOMP/DISCECTOMY FUSION N/A 09/01/2016   Procedure: Cervical five-six, Cervical six-seven ANTERIOR CERVICAL DECOMPRESSION/DISCECTOMY FUSION;  Surgeon: Kevan Ny [name], MD;  Location: [facility];  Service: Neurosurgery;  Laterality: N/A;  . APPENDECTOMY  1984  . BACK SURGERY  2012    L5-S1 fusion  . BACK SURGERY  2016   C6-7 Decompression. [facility])  . COLONOSCOPY      Family History  Problem Relation Age  of Onset  . Diabetes Mother   . Diabetes Brother   . Diabetes Sister   . Diabetes Brother   . Colon cancer Neg Hx   . Colon polyps Neg Hx   . Esophageal cancer Neg Hx   . Rectal cancer Neg Hx   . Stomach cancer Neg Hx     Social History   Socioeconomic History  . Marital status: Married    Spouse name: Not on file  . Number of children: Not on file  . Years of education: Not on file  . Highest education level: Not on file  Occupational History  . Not on file  Tobacco Use  . Smoking status: Never Smoker  . Smokeless tobacco: Never Used  Substance and Sexual Activity  . Alcohol use: Yes    Alcohol/week: 0.0 standard drinks    Comment: weekly  . Drug use: No  . Sexual activity: Not on file  Other Topics Concern  . Not on file  Social History Narrative   Married - [place], 3 children   On Disability for lumbar DDD   Born in [place] - moved to [place] when 54 years old   Social Determinants of Radio broadcast assistant Strain:   . Difficulty of Paying Living Expenses:   Food Insecurity:   . Worried About Charity fundraiser in the Last Year:   . Arboriculturist in  the Last Year:   Transportation Needs:   . Film/video editor (Medical):   Marland Kitchen Lack of Transportation (Non-Medical):   Physical Activity:   . Days of Exercise per Week:   . Minutes of Exercise per Session:   Stress:   . Feeling of Stress :   Social Connections:   . Frequency of Communication with Friends and Family:   . Frequency of Social Gatherings with Friends and Family:   . Attends Religious Services:   . Active Member of Clubs or Organizations:   . Attends Archivist Meetings:   Marland Kitchen Marital Status:   Intimate Partner Violence:   . Fear of Current or Ex-Partner:   . Emotionally Abused:   Marland Kitchen Physically Abused:   . Sexually Abused:     Outpatient Medications Prior to Visit  Medication Sig Dispense Refill  . atorvastatin (LIPITOR) 10 MG tablet Take 0.5 tablets (5 mg total) by mouth at  bedtime. 45 tablet 0  . diclofenac (FLECTOR) 1.3 % PTCH Place onto the skin.    Marland Kitchen omega-3 acid ethyl esters (LOVAZA) 1 g capsule Take 2 capsules (2 g total) by mouth 2 (two) times daily. 360 capsule 1  . terbinafine (LAMISIL) 250 MG tablet Take 1 tablet (250 mg total) by mouth daily. 90 tablet 0  . traMADol (ULTRAM) 50 MG tablet Take by mouth 3 (three) times daily.    . TURMERIC PO Take 1 tablet by mouth daily.    . Vitamin D, Ergocalciferol, (DRISDOL) 1.25 MG (50000 UNIT) CAPS capsule Take one tablet every 10 days 12 capsule 3  . zolpidem (AMBIEN) 10 MG tablet Take 1 tablet (10 mg total) by mouth at bedtime as needed for sleep. 90 tablet 1  . nortriptyline (PAMELOR) 10 MG capsule Take 20 mg by mouth at bedtime.  (Patient not taking: Reported on 05/14/2020)     No facility-administered medications prior to visit.    Allergies  Allergen Reactions  . Trazodone And Nefazodone Other (See Comments)    Passes out    ROS Review of Systems  A fourteen system review of systems was performed and found to be positive as per HPI.    Objective:    Physical Exam General:  Well Developed, well nourished, appropriate for stated age.  Neuro:  Alert and oriented,  extra-ocular muscles intact  HEENT:  Normocephalic, atraumatic, neck supple  Skin:  no gross rash, warm, pink. Cardiac:  RRR, S1 S2 Respiratory:  ECTA B/L and A/P, Not using accessory muscles, speaking in full sentences- unlabored. Vascular:  Ext warm, no cyanosis apprec.; cap RF less 2 sec. Psych:  No HI/SI, judgement and insight good, Euthymic mood. Full Affect.  BP 111/73   Pulse 70   Temp 98.4 F (36.9 C) (Oral)   Ht 5' 6"  (1.676 m)   Wt 147 lb (66.7 kg)   SpO2 97%   BMI 23.73 kg/m  Wt Readings from Last 3 Encounters:  05/14/20 147 lb (66.7 kg)  01/31/20 155 lb (70.3 kg)  11/02/19 150 lb (68 kg)     Health Maintenance Due  Topic Date Due  . Hepatitis C Screening  Never done  . PNEUMOCOCCAL POLYSACCHARIDE VACCINE AGE  53-64 HIGH RISK  Never done  . COVID-19 Vaccine (1) Never done  . HIV Screening  Never done  . COLONOSCOPY  02/02/2019  . OPHTHALMOLOGY EXAM  04/08/2019  . INFLUENZA VACCINE  05/13/2020    There are no preventive care reminders to display for this patient.  Lab Results  Component Value Date   TSH 2.760 03/03/2019   Lab Results  Component Value Date   WBC 5.7 03/03/2019   HGB 12.7 (L) 03/03/2019   HCT 36.3 (L) 03/03/2019   MCV 84 03/03/2019   PLT 235 03/03/2019   Lab Results  Component Value Date   NA 140 10/20/2019   K 4.7 10/20/2019   CO2 25 10/20/2019   GLUCOSE 260 (H) 10/20/2019   BUN 14 10/20/2019   CREATININE 0.89 10/20/2019   BILITOT 0.9 02/14/2020   ALKPHOS 53 10/20/2019   AST 24 02/14/2020   ALT 35 02/14/2020   PROT 7.0 02/14/2020   ALBUMIN 4.9 10/20/2019   CALCIUM 9.6 10/20/2019   ANIONGAP 8 05/05/2018   Lab Results  Component Value Date   CHOL 96 (L) 01/27/2020   Lab Results  Component Value Date   HDL 31 (L) 01/27/2020   Lab Results  Component Value Date   LDLCALC 50 01/27/2020   Lab Results  Component Value Date   TRIG 69 01/27/2020   Lab Results  Component Value Date   CHOLHDL 3.1 01/27/2020   Lab Results  Component Value Date   HGBA1C 6.8 (A) 05/14/2020      Assessment & Plan:   Problem List Items Addressed This Visit      Endocrine   Diet-controlled diabetes mellitus ([place]) - Primary (Chronic)    - A1c today is 6.8 , slight increase from prior - Patient agreeable to starting low-dose of Metformin. - Continue to follow low glucose/carbohydrate diet and exercise regimen - Foot exam performed today, wnl - UA microalbumin normal.       Relevant Medications   metFORMIN (GLUCOPHAGE) 500 MG tablet   Other Relevant Orders   POCT glycosylated hemoglobin (Hb A1C) (Completed)   POCT UA - Microalbumin (Completed)   Comp Met (CMET)   Hyperlipidemia associated with type 2 diabetes mellitus (HCC)    - Last lipid panel: HDL 31, LDL 50  - Continue Atorvastatin and Lovaza - Follow heart healthy diet. - Rechecking lipid panel and hepatic function today.       Relevant Medications   metFORMIN (GLUCOPHAGE) 500 MG tablet   Other Relevant Orders   Lipid Profile   Comp Met (CMET)     Other   Vitamin D deficiency    -Last Vitamin D 63.9 -Continue Vit D 50,0000 units every 10 days. -Rechecking vitamin D level today.      Relevant Orders   Vitamin D (25 hydroxy)   High triglycerides    -Last triglycerides 69 -Continue Atorvastatin and Lovaza. -Rechecking lipid panel today.      Relevant Orders   Lipid Profile      Meds ordered this encounter  Medications  . metFORMIN (GLUCOPHAGE) 500 MG tablet    Sig: Take 1 tablet (500 mg total) by mouth daily with breakfast.    Dispense:  90 tablet    Refill:  1    Order Specific Question:   Supervising Provider    Answer:   Beatrice Lecher D [[id number]]    Follow-up: Return in about 3 months (around 08/14/2020) for DM- started med, HLD, Insomnia.    Lorrene Reid, PA-C

## 2020-05-14 NOTE — Assessment & Plan Note (Addendum)
-  Last Vitamin D 63.9 -Continue Vit D 50,0000 units every 10 days. -Rechecking vitamin D level today.

## 2020-05-14 NOTE — Assessment & Plan Note (Addendum)
-  Last triglycerides 69 -Continue Atorvastatin and Lovaza. -Rechecking lipid panel today.

## 2020-05-14 NOTE — Assessment & Plan Note (Addendum)
>>  ASSESSMENT AND PLAN FOR HYPERLIPIDEMIA ASSOCIATED WITH TYPE 2 DIABETES MELLITUS (HCC) WRITTEN ON 05/14/2020 10:10 AM BY Jayvien Rowlette, PA-C  - Last lipid panel: HDL 31, LDL 50 - Continue Atorvastatin and Lovaza - Follow heart healthy diet. - Rechecking lipid panel and hepatic function today.   >>ASSESSMENT AND PLAN FOR HIGH TRIGLYCERIDES WRITTEN ON 05/14/2020 10:10 AM BY Alexsandria Kivett, PA-C  -Last triglycerides 69 -Continue Atorvastatin and Lovaza. -Rechecking lipid panel today.

## 2020-05-14 NOTE — Patient Instructions (Signed)

## 2020-05-15 LAB — COMPREHENSIVE METABOLIC PANEL
ALT: 46 IU/L — ABNORMAL HIGH (ref 0–44)
AST: 26 IU/L (ref 0–40)
Albumin/Globulin Ratio: 2.2 (ref 1.2–2.2)
Albumin: 5 g/dL — ABNORMAL HIGH (ref 3.8–4.9)
Alkaline Phosphatase: 43 IU/L — ABNORMAL LOW (ref 48–121)
BUN/Creatinine Ratio: 14 (ref 9–20)
BUN: 13 mg/dL (ref 6–24)
Bilirubin Total: 1.2 mg/dL (ref 0.0–1.2)
CO2: 26 mmol/L (ref 20–29)
Calcium: 9.8 mg/dL (ref 8.7–10.2)
Chloride: 101 mmol/L (ref 96–106)
Creatinine, Ser: 0.94 mg/dL (ref 0.76–1.27)
GFR calc Af Amer: 107 mL/min/{1.73_m2} (ref >59)
GFR calc non Af Amer: 92 mL/min/{1.73_m2} (ref >59)
Globulin, Total: 2.3 g/dL (ref 1.5–4.5)
Glucose: 128 mg/dL — ABNORMAL HIGH (ref 65–99)
Potassium: 4.6 mmol/L (ref 3.5–5.2)
Sodium: 141 mmol/L (ref 134–144)
Total Protein: 7.3 g/dL (ref 6.0–8.5)

## 2020-05-15 LAB — LIPID PANEL
Chol/HDL Ratio: 4.3 ratio (ref 0.0–5.0)
Cholesterol, Total: 134 mg/dL (ref 100–199)
HDL: 31 mg/dL — ABNORMAL LOW (ref >39)
LDL Chol Calc (NIH): 82 mg/dL (ref 0–99)
Triglycerides: 114 mg/dL (ref 0–149)
VLDL Cholesterol Cal: 21 mg/dL (ref 5–40)

## 2020-05-15 LAB — VITAMIN D 25 HYDROXY (VIT D DEFICIENCY, FRACTURES): Vit D, 25-Hydroxy: 56.7 ng/mL (ref 30.0–100.0)

## 2020-06-25 ENCOUNTER — Telehealth: Payer: Self-pay | Admitting: Physician Assistant

## 2020-06-25 DIAGNOSIS — E119 Type 2 diabetes mellitus without complications: Secondary | ICD-10-CM

## 2020-06-25 MED ORDER — DEXCOM G6 RECEIVER DEVI: 0 refills | Status: DC

## 2020-06-25 MED ORDER — EMPAGLIFLOZIN 10 MG PO TABS: 10.0000 mg | ORAL_TABLET | Freq: Every day | ORAL | 2 refills | Status: DC

## 2020-06-25 MED ORDER — DEXCOM G6 SENSOR MISC: 1 refills | Status: DC

## 2020-06-25 NOTE — Telephone Encounter (Signed)
Sent rx for Dexcom sensors/device and Jardiance. Patient reports intolerance to Metformin.  Lorrene Reid, PA-C

## 2020-06-25 NOTE — Telephone Encounter (Signed)
Patient is requesting a call back from clinic staff to discuss huis metformin and some issues he is having with this med.  Also, he wants to discuss getting a new glucose monitor too.  Please contact when available.

## 2020-06-25 NOTE — Telephone Encounter (Signed)
Patient states he has been taking the Metformin for 1 week now and is feeling shaky, uneasy and would like to change medication.   Patient is also requesting to try dexcom glucometer.   Please review and advise. AS, CMA

## 2020-06-25 NOTE — Telephone Encounter (Signed)
Will discontinue Metformin and send Jardiance 10 mg once daily. Recommend to monitor glucose to ensure it is not hypoglycemia causing his symptoms. Will also send rx for dexcom sensors and device.

## 2020-06-26 NOTE — Telephone Encounter (Signed)
Attempted to call patient, no answer. Unable to leave voicemail. AS, CMA

## 2020-06-27 NOTE — Telephone Encounter (Signed)
Attempted to call patient. No answer. Unable to leave VM. AS, CMA

## 2020-06-28 ENCOUNTER — Other Ambulatory Visit: Payer: Self-pay | Admitting: Physician Assistant

## 2020-06-28 DIAGNOSIS — G4700 Insomnia, unspecified: Secondary | ICD-10-CM

## 2020-06-28 MED ORDER — ZOLPIDEM TARTRATE 10 MG PO TABS: 10.0000 mg | ORAL_TABLET | Freq: Every evening | ORAL | 0 refills | Status: DC | PRN

## 2020-06-28 NOTE — Telephone Encounter (Signed)
Attempted to call patient. Unable to leave VM due to mail box not being set up. AS< CMA

## 2020-06-28 NOTE — Telephone Encounter (Signed)
Patient requesting refill of Ambien.   Last apt was 05/14/20 and patient advised to follow up in 3 months.   Last refill given 10/12/19 #90 with 1 refill.    Please approve if refill deemed appropriate. AS, CMA

## 2020-06-29 ENCOUNTER — Other Ambulatory Visit: Payer: Self-pay

## 2020-06-29 ENCOUNTER — Ambulatory Visit (INDEPENDENT_AMBULATORY_CARE_PROVIDER_SITE_OTHER): Payer: 59 | Admitting: *Deleted

## 2020-06-29 DIAGNOSIS — B351 Tinea unguium: Secondary | ICD-10-CM

## 2020-06-29 NOTE — Progress Notes (Signed)
Patient presents today for the 4th laser treatment. Diagnosed with mycotic nail infection by Dr. Paulla Dolly.   Toenail most affected 1st, 4th, 5th bilateral. The nails are looking better.  All other systems are negative.  Nails were filed thin. Laser therapy was administered to 1-5 toenails bilateral and patient tolerated the treatment well. All safety precautions were in place.   Patient started terbinafine on 03/09/20. He has completed the medication.  Follow up in 6 weeks for laser # 5.

## 2020-06-29 NOTE — Patient Instructions (Signed)

## 2020-07-02 ENCOUNTER — Telehealth: Payer: Self-pay | Admitting: Physician Assistant

## 2020-07-04 NOTE — Telephone Encounter (Signed)
error 

## 2020-08-02 ENCOUNTER — Telehealth: Payer: Self-pay | Admitting: Physician Assistant

## 2020-08-02 DIAGNOSIS — Z1211 Encounter for screening for malignant neoplasm of colon: Secondary | ICD-10-CM

## 2020-08-02 NOTE — Telephone Encounter (Signed)
Patient is requesting a referral for a routine colonoscopy, please place referral if appropriate

## 2020-08-02 NOTE — Addendum Note (Signed)
Addended by: Fonnie Mu on: 08/02/2020 05:03 PM   Modules accepted: Orders

## 2020-08-10 ENCOUNTER — Ambulatory Visit (INDEPENDENT_AMBULATORY_CARE_PROVIDER_SITE_OTHER): Payer: 59 | Admitting: *Deleted

## 2020-08-10 ENCOUNTER — Other Ambulatory Visit: Payer: Self-pay

## 2020-08-10 DIAGNOSIS — Z79899 Other long term (current) drug therapy: Secondary | ICD-10-CM

## 2020-08-10 DIAGNOSIS — B351 Tinea unguium: Secondary | ICD-10-CM

## 2020-08-10 NOTE — Progress Notes (Signed)
Patient presents today for the 5th laser treatment. Diagnosed with mycotic nail infection by Dr. Paulla Dolly.   Toenail most affected 1st, 4th, 5th bilateral. The nails have grown out quite a bit and looking a lot better.  All other systems are negative.  Nails were filed thin. Laser therapy was administered to 1-5 toenails bilateral and patient tolerated the treatment well. All safety precautions were in place.   Patient started terbinafine on 03/09/20. He has completed the medication. He is requesting a lab order for bloodwork just to make sure his liver enzymes went back to normal since they were previously elevated.  Patient given a blood requisition today.  Follow up in 8 weeks for laser # 6  ~Take final pic next visit~

## 2020-08-11 LAB — HEPATIC FUNCTION PANEL
AG Ratio: 2 (calc) (ref 1.0–2.5)
ALT: 28 U/L (ref 9–46)
AST: 19 U/L (ref 10–35)
Albumin: 4.7 g/dL (ref 3.6–5.1)
Alkaline phosphatase (APISO): 34 U/L — ABNORMAL LOW (ref 35–144)
Bilirubin, Direct: 0.2 mg/dL (ref 0.0–0.2)
Globulin: 2.3 g/dL (calc) (ref 1.9–3.7)
Indirect Bilirubin: 0.8 mg/dL (calc) (ref 0.2–1.2)
Total Bilirubin: 1 mg/dL (ref 0.2–1.2)
Total Protein: 7 g/dL (ref 6.1–8.1)

## 2020-08-17 ENCOUNTER — Other Ambulatory Visit: Payer: Self-pay

## 2020-08-17 ENCOUNTER — Telehealth: Payer: Self-pay | Admitting: Podiatry

## 2020-08-17 ENCOUNTER — Other Ambulatory Visit: Payer: Managed Care, Other (non HMO)

## 2020-08-17 DIAGNOSIS — E119 Type 2 diabetes mellitus without complications: Secondary | ICD-10-CM

## 2020-08-17 DIAGNOSIS — E785 Hyperlipidemia, unspecified: Secondary | ICD-10-CM

## 2020-08-17 DIAGNOSIS — E1169 Type 2 diabetes mellitus with other specified complication: Secondary | ICD-10-CM

## 2020-08-17 NOTE — Telephone Encounter (Signed)
Pt called stating he received his blood work results and would like for you to review it with him. Please advise.

## 2020-08-18 LAB — LIPID PANEL
Chol/HDL Ratio: 3.8 ratio (ref 0.0–5.0)
Cholesterol, Total: 110 mg/dL (ref 100–199)
HDL: 29 mg/dL — ABNORMAL LOW (ref >39)
LDL Chol Calc (NIH): 61 mg/dL (ref 0–99)
Triglycerides: 109 mg/dL (ref 0–149)
VLDL Cholesterol Cal: 20 mg/dL (ref 5–40)

## 2020-08-18 LAB — HEMOGLOBIN A1C
Est. average glucose Bld gHb Est-mCnc: 143 mg/dL
Hgb A1c MFr Bld: 6.6 % — ABNORMAL HIGH (ref 4.8–5.6)

## 2020-08-22 NOTE — Telephone Encounter (Signed)
Everything was normal as far as taking the antifungal. Please let him know everything was good

## 2020-09-19 ENCOUNTER — Encounter: Payer: Self-pay | Admitting: Physician Assistant

## 2020-09-19 ENCOUNTER — Ambulatory Visit (INDEPENDENT_AMBULATORY_CARE_PROVIDER_SITE_OTHER): Payer: Managed Care, Other (non HMO) | Admitting: Physician Assistant

## 2020-09-19 ENCOUNTER — Other Ambulatory Visit: Payer: Self-pay

## 2020-09-19 VITALS — BP 110/70 | Ht 65.0 in | Wt 150.0 lb

## 2020-09-19 DIAGNOSIS — E781 Pure hyperglyceridemia: Secondary | ICD-10-CM | POA: Diagnosis not present

## 2020-09-19 DIAGNOSIS — E1169 Type 2 diabetes mellitus with other specified complication: Secondary | ICD-10-CM

## 2020-09-19 DIAGNOSIS — E786 Lipoprotein deficiency: Secondary | ICD-10-CM | POA: Diagnosis not present

## 2020-09-19 DIAGNOSIS — G4700 Insomnia, unspecified: Secondary | ICD-10-CM

## 2020-09-19 DIAGNOSIS — E785 Hyperlipidemia, unspecified: Secondary | ICD-10-CM

## 2020-09-19 MED ORDER — ZOLPIDEM TARTRATE 10 MG PO TABS: 10.0000 mg | ORAL_TABLET | Freq: Every evening | ORAL | 0 refills | Status: DC | PRN

## 2020-09-19 MED ORDER — OMEGA-3-ACID ETHYL ESTERS 1 G PO CAPS: 2.0000 g | ORAL_CAPSULE | Freq: Two times a day (BID) | ORAL | 1 refills | Status: DC

## 2020-09-19 NOTE — Progress Notes (Signed)
Telehealth office visit note for Richard Reid, PA-C- at Primary Care at [facility]   I connected with current patient today by telephone and verified that I am speaking with the correct person   . Location of the patient: Home . Location of the provider: Office - This visit type was conducted due to national recommendations for restrictions regarding the COVID-19 Pandemic (e.g. social distancing) in an effort to limit this patient's exposure and mitigate transmission in our community.    - No physical exam could be performed with this format, beyond that communicated to [place] by the patient/ family members as noted.   - Additionally my office staff/ schedulers were to discuss with the patient that there may be a monetary charge related to this service, depending on their medical insurance.  My understanding is that patient understood and consented to proceed.     _________________________________________________________________________________   History of Present Illness: Patient calls in to follow up on diabetes mellitus and hyperlipidemia. Has no acute concerns today.  Diabetes: Pt denies increased urination or thirst. Pt reports discontinued Jardiance, states difficult to explain how he felt with medication. Restarted Metformin 500 mg once daily and is tolerating it better. No hypoglycemic events. Checking glucose at home and readings range 100-120. Is watching carbohydrates and glucose.  HLD: Pt taking medication as directed without issues. Denies side effects. Has returned to the gym.   Insomnia: Reports takes Ambien every night to help with sleep.     GAD 7 : Generalized Anxiety Score 03/22/2019  Nervous, Anxious, on Edge 0  Control/stop worrying 0  Worry too much - different things 0  Trouble relaxing 0  Restless 0  Easily annoyed or irritable 0  Afraid - awful might happen 0  Total GAD 7 Score 0  Anxiety Difficulty Not difficult at all    Depression screen [facility]  2/9 09/19/2020 05/14/2020 01/31/2020 11/02/2019 10/12/2019  Decreased Interest 0 0 0 0 0  Down, Depressed, Hopeless 0 0 0 0 0  PHQ - 2 Score 0 0 0 0 0  Altered sleeping 0 0 1 0 0  Tired, decreased energy 0 0 0 0 0  Change in appetite 0 0 0 0 0  Feeling bad or failure about yourself  0 0 0 0 0  Trouble concentrating 0 0 0 0 0  Moving slowly or fidgety/restless 0 0 0 0 0  Suicidal thoughts 0 0 0 0 0  PHQ-9 Score 0 0 1 0 0  Difficult doing work/chores - - Not difficult at all - -  Some recent data might be hidden      Impression and Recommendations:     1. Type 2 diabetes mellitus with other specified complication, without long-term current use of insulin ([place])   2. Hyperlipidemia associated with type 2 diabetes mellitus ([place])   3. Insomnia, unspecified type     Type 2 diabetes mellitus with other specified complication, without long-term current use of insulin: -Most recent A1c has improved from 6.8-6.6. -Continue Metformin 500 mg once daily. -Continue ambulatory glucose monitoring. -Continue to monitor carbohydrates and glucose. Continue to stay active. -Will continue to monitor.  Hyperlipidemia associated with type 2 diabetes mellitus: -Most recent lipid panel: Total cholesterol 110, 2 glycerides 109, HDL 29, LDL 61 (at goal) -Continue Lipitor 10 mg (0.5 tablet) and Lovaza.  -Follow a heart healthy diet low in saturated and transfats. -Continue to stay active. -Will continue to monitor.  Insomnia, unspecified type: -Stable. -  Continue current medication regimen.  Provided refill. -Recommend to establish a good sleep hygiene and limit/avoid caffeine use.   - As part of my medical decision making, I reviewed the following data within the [place] History obtained from pt /family, CMA notes reviewed and incorporated if applicable, Labs reviewed, Radiograph/ tests reviewed if applicable and OV notes from prior OV's with me, as well as any other specialists she/he  has seen since seeing me last, were all reviewed and used in my medical decision making process today.    - Additionally, when appropriate, discussion had with patient regarding our treatment plan, and their biases/concerns about that plan were used in my medical decision making today.    - The patient agreed with the plan and demonstrated an understanding of the instructions.   No barriers to understanding were identified.     - The patient was advised to call back or seek an in-person evaluation if the symptoms worsen or if the condition fails to improve as anticipated.   Return in about 4 months (around 01/18/2021) for DM, HLD, insomnia .    No orders of the defined types were placed in this encounter.   Meds ordered this encounter  Medications  . zolpidem (AMBIEN) 10 MG tablet    Sig: Take 1 tablet (10 mg total) by mouth at bedtime as needed for sleep.    Dispense:  90 tablet    Refill:  0    Order Specific Question:   Supervising Provider    Answer:   Beatrice Lecher D [[id number]]    Medications Discontinued During This Encounter  Medication Reason  . empagliflozin (JARDIANCE) 10 MG TABS tablet Side effect (s)  . terbinafine (LAMISIL) 250 MG tablet Patient Preference  . nortriptyline (PAMELOR) 10 MG capsule Patient Preference  . zolpidem (AMBIEN) 10 MG tablet Reorder       Time spent on visit including pre-visit chart review and post-visit care was 10 minutes.      The [place] was signed into law in 2016 which includes the topic of electronic health records.  This provides immediate access to information in MyChart.  This includes consultation notes, operative notes, office notes, lab results and pathology reports.  If you have any questions about what you read please let us know at your next visit or call us at the office.  We are right here with you.  Note:  This note was prepared with assistance of Dragon voice recognition software. Occasional wrong-word  or sound-a-like substitutions may have occurred due to the inherent limitations of voice recognition software.  __________________________________________________________________________________     Patient Care Team    Relationship Specialty Notifications Start End  Richard Noble, [place] PCP - General   02/12/20   Carlyle Dolly, MD Resident Family Medicine All results, Admissions 10/03/15   [name], Kevan Ny, MD Consulting Physician Neurosurgery  11/05/16   Marlaine Hind, MD Consulting Physician Physical Medicine and Rehabilitation  11/05/16    Comment: pain Doc  [place], [name], [place]  Optometry  05/03/18      -Vitals obtained; medications/ allergies reconciled;  personal medical, social, Sx etc.histories were updated by CMA, reviewed by me and are reflected in chart   Patient Active Problem List   Diagnosis Date Noted  . High triglycerides 03/22/2019  . Dizziness 08/12/2018  . Orthostatic hypotension 08/12/2018  . Dehydration, mild 08/12/2018  . Vitamin D deficiency 01/01/2018  . Hyperlipidemia associated with type 2 diabetes mellitus ([place])  01/01/2018  . Low testosterone level in male 01/01/2018  . Low HDL (under 40) 11/05/2016  . Diet-controlled diabetes mellitus ([place]) 11/05/2016  . Status post colonoscopy 2008 11/05/2016  . Status post lumbar spinal fusion 11/05/2016  . Not currently working due to disabled status 11/05/2016  . Status post cervical spinal fusion 11/05/2016  . Long-term use of high-risk medication 11/05/2016  . Muscle spasms of neck, upper back & lower back 11/05/2016  . Osteoarthritis of spine with radiculopathy, cervical region 09/01/2016  . DDD (degenerative disc disease), lumbar 09/28/2015  . Insomnia 09/28/2015  . DDD (degenerative disc disease), cervical 09/05/2015  . Chronic pain associated with significant psychosocial dysfunction 09/05/2015  . Chronic pain syndrome 09/05/2015  . Cervical stenosis of spine 09/05/2015  . Cervical pain (neck)  08/07/2015     Current Meds  Medication Sig  . atorvastatin (LIPITOR) 10 MG tablet Take 0.5 tablets (5 mg total) by mouth at bedtime.  . diclofenac (FLECTOR) 1.3 % PTCH Place onto the skin.  Marland Kitchen omega-3 acid ethyl esters (LOVAZA) 1 g capsule Take 2 capsules (2 g total) by mouth 2 (two) times daily.  . traMADol (ULTRAM) 50 MG tablet Take by mouth 3 (three) times daily.  . TURMERIC PO Take 1 tablet by mouth daily.  . Vitamin D, Ergocalciferol, (DRISDOL) 1.25 MG (50000 UNIT) CAPS capsule Take one tablet every 10 days  . zolpidem (AMBIEN) 10 MG tablet Take 1 tablet (10 mg total) by mouth at bedtime as needed for sleep.  . [DISCONTINUED] zolpidem (AMBIEN) 10 MG tablet Take 1 tablet (10 mg total) by mouth at bedtime as needed for sleep.     Allergies:  Allergies  Allergen Reactions  . Trazodone And Nefazodone Other (See Comments)    Passes out     ROS:  See above HPI for pertinent positives and negatives   Objective:   Blood pressure 110/70, height 5\' 5"  (1.651 m), weight 150 lb (68 kg).  (if some vitals are omitted, this means that patient was UNABLE to obtain them even though they were asked to get them prior to OV today.  They were asked to call us at their earliest convenience with these once obtained. ) General: A & O * 3; sounds in no acute distress Respiratory: speaking in full sentences, no conversational dyspnea Psych: insight appears good, mood- appears full

## 2020-09-22 ENCOUNTER — Other Ambulatory Visit: Payer: Self-pay | Admitting: Physician Assistant

## 2020-09-22 DIAGNOSIS — E1169 Type 2 diabetes mellitus with other specified complication: Secondary | ICD-10-CM

## 2020-09-22 DIAGNOSIS — E786 Lipoprotein deficiency: Secondary | ICD-10-CM

## 2020-09-22 DIAGNOSIS — E781 Pure hyperglyceridemia: Secondary | ICD-10-CM

## 2020-09-28 ENCOUNTER — Other Ambulatory Visit: Payer: Self-pay

## 2020-09-28 ENCOUNTER — Ambulatory Visit (INDEPENDENT_AMBULATORY_CARE_PROVIDER_SITE_OTHER): Payer: Managed Care, Other (non HMO) | Admitting: *Deleted

## 2020-09-28 DIAGNOSIS — B351 Tinea unguium: Secondary | ICD-10-CM

## 2020-09-28 NOTE — Progress Notes (Signed)
Patient presents today for the 6th laser treatment. Diagnosed with mycotic nail infection by Dr. Paulla Dolly.   Toenail most affected 1st, 4th, 5th bilateral. The nails are completely grown out and looking healthy. He is very satisfied with the outcome.  All other systems are negative.  Nails were filed thin. Laser therapy was administered to 1-5 toenails bilateral and patient tolerated the treatment well. All safety precautions were in place.   Patient started terbinafine on 03/09/20. He has completed the medication.   Patient has completed the recommended laser treatments. He will follow up with Dr. Paulla Dolly in 3 months to evaluate progress.    ~Final nail pics taken today~

## 2020-10-02 ENCOUNTER — Other Ambulatory Visit: Payer: Self-pay | Admitting: Physician Assistant

## 2020-10-02 MED ORDER — METFORMIN HCL 500 MG PO TABS: 500.0000 mg | ORAL_TABLET | Freq: Every day | ORAL | 0 refills | Status: DC

## 2020-10-02 NOTE — Addendum Note (Signed)
Addended by: Mickel Crow on: 10/02/2020 01:26 PM   Modules accepted: Orders

## 2020-10-02 NOTE — Telephone Encounter (Signed)
Med from historical provider. Please approve if deemed appropriate. AS, CMA

## 2020-10-02 NOTE — Telephone Encounter (Signed)
Patient needs a refill on Metformin and uses [facility] on [name]. Thanks

## 2020-10-04 ENCOUNTER — Other Ambulatory Visit: Payer: Self-pay | Admitting: Physician Assistant

## 2020-10-04 DIAGNOSIS — E119 Type 2 diabetes mellitus without complications: Secondary | ICD-10-CM

## 2020-10-10 LAB — HM DIABETES EYE EXAM

## 2020-10-25 ENCOUNTER — Other Ambulatory Visit: Payer: Self-pay

## 2020-10-25 ENCOUNTER — Ambulatory Visit (AMBULATORY_SURGERY_CENTER): Payer: Managed Care, Other (non HMO) | Admitting: *Deleted

## 2020-10-25 VITALS — Ht 65.0 in | Wt 150.0 lb

## 2020-10-25 DIAGNOSIS — Z1211 Encounter for screening for malignant neoplasm of colon: Secondary | ICD-10-CM

## 2020-10-25 MED ORDER — SUPREP BOWEL PREP KIT 17.5-3.13-1.6 GM/177ML PO SOLN: 1.0000 | Freq: Once | ORAL | 0 refills | Status: AC

## 2020-10-25 NOTE — Progress Notes (Signed)
Pt verified name, DOB, address and insurance during PV today. Pt mailed instruction packet to included paper to complete and mail back to [facility] with addressed and stamped envelope, Emmi video, copy of consent form to read and not return, and instructions. Suprep  coupon mailed in packet. PV completed over the phone. Pt encouraged to call with questions or issues   No egg or soy allergy known to patient  No issues with past sedation with any surgeries or procedures No intubation problems in the past  No FH of Malignant Hyperthermia No diet pills per patient No home 02 use per patient  No blood thinners per patient   Pt states has a BM EOD- some hard - due to Tramadol- uses Fiber - if eats good , normal BM;s- if increases water soft bm's- occ blood noted with hard stools - will do a 2 day Suprep due to these issues   No A fib or A flutter  EMMI video to pt or via MyChart  COVID 19 guidelines implemented in PV today with Pt and RN  Pt is fully vaccinated  for Covid   suprep  Coupon given to pt in PV today , Code to Pharmacy   Due to the COVID-19 pandemic we are asking patients to follow certain guidelines.  Pt aware of COVID protocols and [facility] guidelines

## 2020-11-02 ENCOUNTER — Encounter: Payer: Self-pay | Admitting: Gastroenterology

## 2020-11-05 ENCOUNTER — Telehealth: Payer: Self-pay | Admitting: Gastroenterology

## 2020-11-05 NOTE — Telephone Encounter (Signed)
Pt is requesting a call back from a nurse to clarify some instructions regarding the dulcolax.

## 2020-11-05 NOTE — Telephone Encounter (Signed)
Pt asking if he needs Dulcolax Laxative tablets or Dulcolax stool softener Instructed Laxative 5 mg tabs x 4- pt verbalized understanding

## 2020-11-07 ENCOUNTER — Encounter: Payer: Self-pay | Admitting: Gastroenterology

## 2020-11-07 ENCOUNTER — Other Ambulatory Visit: Payer: Self-pay

## 2020-11-07 ENCOUNTER — Ambulatory Visit (AMBULATORY_SURGERY_CENTER): Payer: Managed Care, Other (non HMO) | Admitting: Gastroenterology

## 2020-11-07 VITALS — BP 107/69 | HR 57 | Temp 96.8°F | Resp 12 | Ht 65.0 in | Wt 150.0 lb

## 2020-11-07 DIAGNOSIS — K640 First degree hemorrhoids: Secondary | ICD-10-CM

## 2020-11-07 DIAGNOSIS — K5730 Diverticulosis of large intestine without perforation or abscess without bleeding: Secondary | ICD-10-CM

## 2020-11-07 DIAGNOSIS — Z1211 Encounter for screening for malignant neoplasm of colon: Secondary | ICD-10-CM

## 2020-11-07 DIAGNOSIS — D122 Benign neoplasm of ascending colon: Secondary | ICD-10-CM

## 2020-11-07 MED ORDER — SODIUM CHLORIDE 0.9 % IV SOLN: 500.0000 mL | Freq: Once | INTRAVENOUS | Status: DC

## 2020-11-07 NOTE — Patient Instructions (Signed)
Please read handouts provided. Continue present medications. Await pathology results. Return to GI clinic as needed.     YOU HAD AN ENDOSCOPIC PROCEDURE TODAY AT THE [facility]:   Refer to the procedure report that was given to you for any specific questions about what was found during the examination.  If the procedure report does not answer your questions, please call your gastroenterologist to clarify.  If you requested that your care partner not be given the details of your procedure findings, then the procedure report has been included in a sealed envelope for you to review at your convenience later.  YOU SHOULD EXPECT: Some feelings of bloating in the abdomen. Passage of more gas than usual.  Walking can help get rid of the air that was put into your GI tract during the procedure and reduce the bloating. If you had a lower endoscopy (such as a colonoscopy or flexible sigmoidoscopy) you may notice spotting of blood in your stool or on the toilet paper. If you underwent a bowel prep for your procedure, you may not have a normal bowel movement for a few days.  Please Note:  You might notice some irritation and congestion in your nose or some drainage.  This is from the oxygen used during your procedure.  There is no need for concern and it should clear up in a day or so.  SYMPTOMS TO REPORT IMMEDIATELY:   Following lower endoscopy (colonoscopy or flexible sigmoidoscopy):  Excessive amounts of blood in the stool  Significant tenderness or worsening of abdominal pains  Swelling of the abdomen that is new, acute  Fever of 100F or higher    For urgent or emergent issues, a gastroenterologist can be reached at any hour by calling [phone]. Do not use MyChart messaging for urgent concerns.    DIET:  We do recommend a small meal at first, but then you may proceed to your regular diet.  Drink plenty of fluids but you should avoid alcoholic beverages for 24  hours.  ACTIVITY:  You should plan to take it easy for the rest of today and you should NOT DRIVE or use heavy machinery until tomorrow (because of the sedation medicines used during the test).    FOLLOW UP: Our staff will call the number listed on your records 48-72 hours following your procedure to check on you and address any questions or concerns that you may have regarding the information given to you following your procedure. If we do not reach you, we will leave a message.  We will attempt to reach you two times.  During this call, we will ask if you have developed any symptoms of COVID 19. If you develop any symptoms (ie: fever, flu-like symptoms, shortness of breath, cough etc.) before then, please call [phone].  If you test positive for Covid 19 in the 2 weeks post procedure, please call and report this information to us.    If any biopsies were taken you will be contacted by phone or by letter within the next 1-3 weeks.  Please call us at [phone] if you have not heard about the biopsies in 3 weeks.    SIGNATURES/CONFIDENTIALITY: You and/or your care partner have signed paperwork which will be entered into your electronic medical record.  These signatures attest to the fact that that the information above on your After Visit Summary has been reviewed and is understood.  Full responsibility of the confidentiality of this discharge information lies with you   and/or your care-partner. 

## 2020-11-07 NOTE — Progress Notes (Signed)
Pt Drowsy. VSS. To PACU, report to RN. No anesthetic complications noted.  

## 2020-11-07 NOTE — Progress Notes (Signed)
Called to room to assist during endoscopic procedure.  Patient ID and intended procedure confirmed with present staff. Received instructions for my participation in the procedure from the performing physician.  

## 2020-11-07 NOTE — Progress Notes (Signed)
Pt's states no medical or surgical changes since previsit or office visit. 

## 2020-11-07 NOTE — Op Note (Signed)
[place] Patient Name: Richard Noble Procedure Date: 11/07/2020 8:58 AM MRN: [id number] Endoscopist: Gerrit Heck , MD Age: 55 Referring MD:  Date of Birth: 09-01-1966 Gender: Male Account #: [id number] Procedure:                Colonoscopy Indications:              Screening for colorectal malignant neoplasm (last                            colonoscopy was more than 10 years ago at outside                            facility and no polyps per patient) Medicines:                Monitored Anesthesia Care Procedure:                Pre-Anesthesia Assessment:                           - Prior to the procedure, a History and Physical                            was performed, and patient medications and                            allergies were reviewed. The patient's tolerance of                            previous anesthesia was also reviewed. The risks                            and benefits of the procedure and the sedation                            options and risks were discussed with the patient.                            All questions were answered, and informed consent                            was obtained. Prior Anticoagulants: The patient has                            taken no previous anticoagulant or antiplatelet                            agents. ASA Grade Assessment: II - A patient with                            mild systemic disease. After reviewing the risks                            and benefits, the patient was deemed in  satisfactory condition to undergo the procedure.                           After obtaining informed consent, the colonoscope                            was passed under direct vision. Throughout the                            procedure, the patient's blood pressure, pulse, and                            oxygen saturations were monitored continuously. The                            Olympus CF-HQ190L  [id number]) Colonoscope was                            introduced through the anus and advanced to the the                            cecum, identified by appendiceal orifice and                            ileocecal valve. The colonoscopy was performed                            without difficulty. The patient tolerated the                            procedure well. The quality of the bowel                            preparation was good. The ileocecal valve,                            appendiceal orifice, and rectum were photographed. Scope In: 9:26:14 AM Scope Out: 9:40:09 AM Scope Withdrawal Time: 0 hours 11 minutes 52 seconds  Total Procedure Duration: 0 hours 13 minutes 55 seconds  Findings:                 The perianal and digital rectal examinations were                            normal.                           A 4 mm polyp was found in the ascending colon. The                            polyp was sessile. The polyp was removed with a                            cold snare. Resection and retrieval were complete.  Estimated blood loss was minimal.                           A few small-mouthed diverticula were found in the                            sigmoid colon.                           Non-bleeding internal hemorrhoids were found during                            retroflexion. The hemorrhoids were small. Complications:            No immediate complications. Estimated Blood Loss:     Estimated blood loss was minimal. Impression:               - One 4 mm polyp in the ascending colon, removed                            with a cold snare. Resected and retrieved.                           - Diverticulosis in the sigmoid colon.                           - Non-bleeding internal hemorrhoids. Recommendation:           - Patient has a contact number available for                            emergencies. The signs and symptoms of potential                             delayed complications were discussed with the                            patient. Return to normal activities tomorrow.                            Written discharge instructions were provided to the                            patient.                           - Resume previous diet.                           - Continue present medications.                           - Await pathology results.                           - Repeat colonoscopy for surveillance based on  pathology results.                           - Return to GI clinic PRN.                           - Internal hemorrhoids were noted on this study and                            may be amenable to hemorrhoid band ligation. If you                            are interested in further treatment of these                            hemorrhoids with band ligation, please contact my                            clinic to set up an appointment for evaluation and                            treatment. Gerrit Heck, MD 11/07/2020 9:50:42 AM

## 2020-11-09 ENCOUNTER — Telehealth: Payer: Self-pay

## 2020-11-09 NOTE — Telephone Encounter (Signed)
  Follow up Call-  Call back number 11/07/2020  Post procedure Call Back phone  # [phone]  Permission to leave phone message Yes  Some recent data might be hidden     Patient questions:  Do you have a fever, pain , or abdominal swelling? No. Pain Score  0 *  Have you tolerated food without any problems? Yes.    Have you been able to return to your normal activities? Yes.    Do you have any questions about your discharge instructions: Diet   No. Medications  No. Follow up visit  No.  Do you have questions or concerns about your Care? No.  Actions: * If pain score is 4 or above: No action needed, pain <4. 1. Have you developed a fever since your procedure? no  2.   Have you had an respiratory symptoms (SOB or cough) since your procedure? no  3.   Have you tested positive for COVID 19 since your procedure no  4.   Have you had any family members/close contacts diagnosed with the COVID 19 since your procedure?  no   If yes to any of these questions please route to Joylene John, RN and Joella Prince, RN

## 2020-11-20 ENCOUNTER — Encounter: Payer: Self-pay | Admitting: Gastroenterology

## 2020-11-20 DIAGNOSIS — R030 Elevated blood-pressure reading, without diagnosis of hypertension: Secondary | ICD-10-CM | POA: Insufficient documentation

## 2020-11-28 ENCOUNTER — Other Ambulatory Visit: Payer: Self-pay | Admitting: Physician Assistant

## 2020-12-05 ENCOUNTER — Ambulatory Visit (INDEPENDENT_AMBULATORY_CARE_PROVIDER_SITE_OTHER): Payer: 59 | Admitting: Psychologist

## 2020-12-05 DIAGNOSIS — Z630 Problems in relationship with spouse or partner: Secondary | ICD-10-CM | POA: Diagnosis not present

## 2020-12-05 DIAGNOSIS — F411 Generalized anxiety disorder: Secondary | ICD-10-CM

## 2020-12-23 ENCOUNTER — Other Ambulatory Visit: Payer: Self-pay | Admitting: Physician Assistant

## 2020-12-23 DIAGNOSIS — G4700 Insomnia, unspecified: Secondary | ICD-10-CM

## 2020-12-24 ENCOUNTER — Other Ambulatory Visit: Payer: Self-pay | Admitting: Physician Assistant

## 2020-12-24 DIAGNOSIS — E781 Pure hyperglyceridemia: Secondary | ICD-10-CM

## 2020-12-24 DIAGNOSIS — E785 Hyperlipidemia, unspecified: Secondary | ICD-10-CM

## 2020-12-24 DIAGNOSIS — E786 Lipoprotein deficiency: Secondary | ICD-10-CM

## 2020-12-24 DIAGNOSIS — E1169 Type 2 diabetes mellitus with other specified complication: Secondary | ICD-10-CM

## 2020-12-28 ENCOUNTER — Encounter: Payer: Self-pay | Admitting: Podiatry

## 2020-12-28 ENCOUNTER — Other Ambulatory Visit: Payer: Self-pay

## 2020-12-28 ENCOUNTER — Ambulatory Visit (INDEPENDENT_AMBULATORY_CARE_PROVIDER_SITE_OTHER): Payer: Managed Care, Other (non HMO) | Admitting: Podiatry

## 2020-12-28 DIAGNOSIS — Z79899 Other long term (current) drug therapy: Secondary | ICD-10-CM | POA: Diagnosis not present

## 2020-12-28 DIAGNOSIS — B351 Tinea unguium: Secondary | ICD-10-CM | POA: Diagnosis not present

## 2020-12-30 NOTE — Progress Notes (Signed)
Subjective:   Patient ID: Richard Noble, male   DOB: 55 y.o.   MRN: [id number]   HPI Patient states that he is very happy so far with his nails but he wants to know what will need to be done in the future   ROS      Objective:  Physical Exam  Neurovascular status intact with patient's nails in good condition with the yellowness which is reduced quite well with still slight changes noted but much improvement from previous     Assessment:  Doing well post laser and oral medication for fungal nail development     Plan:  H&P reviewed conditions and discussed the continuation of conservative care.  We may do several more lasers and patient may possibly have a oral pulse therapy in the future depending on how he does

## 2021-01-10 ENCOUNTER — Ambulatory Visit (INDEPENDENT_AMBULATORY_CARE_PROVIDER_SITE_OTHER): Payer: Managed Care, Other (non HMO)

## 2021-01-10 ENCOUNTER — Other Ambulatory Visit: Payer: Self-pay

## 2021-01-10 ENCOUNTER — Encounter: Payer: Self-pay | Admitting: Podiatry

## 2021-01-10 ENCOUNTER — Ambulatory Visit: Payer: Medicare Other | Admitting: Podiatry

## 2021-01-10 DIAGNOSIS — M722 Plantar fascial fibromatosis: Secondary | ICD-10-CM

## 2021-01-10 DIAGNOSIS — M79671 Pain in right foot: Secondary | ICD-10-CM

## 2021-01-10 MED ORDER — TRIAMCINOLONE ACETONIDE 10 MG/ML IJ SUSP
10.0000 mg | Freq: Once | INTRAMUSCULAR | Status: AC
  Administered 2021-01-10: 10 mg

## 2021-01-10 NOTE — Progress Notes (Signed)
Subjective:   Patient ID: Richard Noble, male   DOB: 55 y.o.   MRN: [id number]   HPI Patient presents stating he is developed a lot of pain in the bottom of his right heel   ROS      Objective:  Physical Exam  Neurovascular status intact with exquisite discomfort which is developed plantar aspect right heel     Assessment:  Acute Planter fasciitis right     Plan:  H&P x-ray reviewed sterile prep injected the plantar fashion 3 mg Dexasone Kenalog 5 mg Xylocaine advised on reduced activity reappoint as symptoms indicate  X-rays indicate small spur no indication stress fracture arthritis

## 2021-03-22 ENCOUNTER — Other Ambulatory Visit: Payer: Self-pay | Admitting: Physician Assistant

## 2021-03-22 DIAGNOSIS — G4700 Insomnia, unspecified: Secondary | ICD-10-CM

## 2021-03-22 DIAGNOSIS — E785 Hyperlipidemia, unspecified: Secondary | ICD-10-CM

## 2021-03-22 DIAGNOSIS — E786 Lipoprotein deficiency: Secondary | ICD-10-CM

## 2021-03-22 DIAGNOSIS — E781 Pure hyperglyceridemia: Secondary | ICD-10-CM

## 2021-03-28 ENCOUNTER — Ambulatory Visit: Payer: Managed Care, Other (non HMO) | Admitting: Physician Assistant

## 2021-04-25 ENCOUNTER — Other Ambulatory Visit: Payer: Self-pay | Admitting: Physician Assistant

## 2021-05-02 ENCOUNTER — Ambulatory Visit: Payer: Managed Care, Other (non HMO) | Admitting: Physician Assistant

## 2021-05-20 ENCOUNTER — Encounter: Payer: Self-pay | Admitting: Physician Assistant

## 2021-05-20 ENCOUNTER — Ambulatory Visit (INDEPENDENT_AMBULATORY_CARE_PROVIDER_SITE_OTHER): Payer: Managed Care, Other (non HMO) | Admitting: Physician Assistant

## 2021-05-20 ENCOUNTER — Other Ambulatory Visit: Payer: Self-pay

## 2021-05-20 VITALS — BP 92/58 | HR 67 | Temp 97.5°F | Ht 65.0 in | Wt 141.6 lb

## 2021-05-20 DIAGNOSIS — M4727 Other spondylosis with radiculopathy, lumbosacral region: Secondary | ICD-10-CM

## 2021-05-20 DIAGNOSIS — E559 Vitamin D deficiency, unspecified: Secondary | ICD-10-CM | POA: Diagnosis not present

## 2021-05-20 DIAGNOSIS — E785 Hyperlipidemia, unspecified: Secondary | ICD-10-CM

## 2021-05-20 DIAGNOSIS — E1169 Type 2 diabetes mellitus with other specified complication: Secondary | ICD-10-CM

## 2021-05-20 DIAGNOSIS — G4700 Insomnia, unspecified: Secondary | ICD-10-CM | POA: Diagnosis not present

## 2021-05-20 LAB — POCT GLYCOSYLATED HEMOGLOBIN (HGB A1C): Hemoglobin A1C: 6.5 % — AB (ref 4.0–5.6)

## 2021-05-20 LAB — POCT UA - MICROALBUMIN
Creatinine, POC: 50 mg/dL
Microalbumin Ur, POC: 10 mg/L

## 2021-05-20 NOTE — Assessment & Plan Note (Signed)
-  Good sleep hygiene discussed. Unstable insomnia secondary to lumbar pain. Continue current medication regimen.

## 2021-05-20 NOTE — Progress Notes (Signed)
Established Patient Office Visit  Subjective:  Patient ID: Richard Noble, male    DOB: [birth year]  Age: 55 y.o. MRN: [id number]  CC:  Chief Complaint  Patient presents with   Diabetes   Hyperlipidemia   Insomnia    HPI Richard Noble presents for follow up on diabetes mellitus, hyperlipidemia and insomnia.   Diabetes: Pt denies increased urination or thirst. Pt reports medication compliance. Reports one hypoglycemic event last week which resolved after having a piece of candy. Checking glucose at home sometimes. States continues with low carbohydrate/glucose diet.  HLD: Pt taking medication as directed without issues. Continues with a low fat diet and is staying active with yoga and swimming.   Insomnia: Patient reports has needed to take Ambien every night to help with sleep due to back pain. Patient is followed by [facility] for spondylosis. States is trying conservative therapy before considering another surgery.     Past Medical History:  Diagnosis Date   Anemia    as child    Anxiety    uses valium for anxiety related to pain.    Arthritis    stenosis, cervical area, arthritis  - spine    Diabetes mellitus without complication ([place])    treated with diet only- undercontrol per pt    History of exercise stress test     in [place] state 20 yrs. ago had stress test & he was followed by cardiologist for a couple yrs. , but hasn't been referred since he has lived here.    History of kidney stones    found incidentally - no problems, just stable    Hyperlipidemia    borderline no meds   Sleep concern    study done in [place], told that it was normal, sleep issue related to pain.      Past Surgical History:  Procedure Laterality Date   ANTERIOR CERVICAL DECOMP/DISCECTOMY FUSION N/A 09/01/2016   Procedure: Cervical five-six, Cervical six-seven ANTERIOR CERVICAL DECOMPRESSION/DISCECTOMY FUSION;  Surgeon: Kevan Ny [name], MD;  Location: [place];  Service: Neurosurgery;   Laterality: N/A;   APPENDECTOMY  1984   BACK SURGERY  2012    L5-S1 fusion   BACK SURGERY  2016   C6-7 Decompression. [facility])   COLONOSCOPY  2008   [place]- normal per pt     Family History  Problem Relation Age of Onset   Diabetes Mother    Diabetes Brother    Diabetes Sister    Diabetes Brother    Colon cancer Neg Hx    Colon polyps Neg Hx    Esophageal cancer Neg Hx    Rectal cancer Neg Hx    Stomach cancer Neg Hx     Social History   Socioeconomic History   Marital status: Married    Spouse name: Not on file   Number of children: Not on file   Years of education: Not on file   Highest education level: Not on file  Occupational History   Not on file  Tobacco Use   Smoking status: Never   Smokeless tobacco: Never  Substance and Sexual Activity   Alcohol use: Yes    Alcohol/week: 0.0 standard drinks    Comment: weekly   Drug use: No   Sexual activity: Not on file  Other Topics Concern   Not on file  Social History Narrative   Married - [place], 3 children   On Disability for lumbar DDD   Born in [place] -  moved to [place] when 55 years old   Social Determinants of Radio broadcast assistant Strain: Not on file  Food Insecurity: Not on file  Transportation Needs: Not on file  Physical Activity: Not on file  Stress: Not on file  Social Connections: Not on file  Intimate Partner Violence: Not on file    Outpatient Medications Prior to Visit  Medication Sig Dispense Refill   atorvastatin (LIPITOR) 10 MG tablet TAKE 1/2 TABLET BY MOUTH AT BEDTIME 45 tablet 0   Continuous Blood Gluc Receiver (DEXCOM G6 RECEIVER) DEVI Use to check blood sugars continuously. 1 each 0   Continuous Blood Gluc Sensor (DEXCOM G6 SENSOR) MISC Use to check blood sugars continuously. Change sensor every 7 days. 12 each 1   GLUCOSAMINE-CHONDROITIN PO Take 1,500 mg by mouth daily.     Magnesium 250 MG TABS Take by mouth.     metFORMIN (GLUCOPHAGE) 500 MG tablet TAKE 1  TABLET (500 MG TOTAL) BY MOUTH DAILY. 90 tablet 0   Multiple Vitamin (MULTIVITAMIN) tablet Take 1 tablet by mouth daily.     omega-3 acid ethyl esters (LOVAZA) 1 g capsule Take 2 capsules (2 g total) by mouth 2 (two) times daily. 360 capsule 1   traMADol (ULTRAM) 50 MG tablet Take by mouth 3 (three) times daily.     TURMERIC PO Take 1 tablet by mouth daily.     Vitamin D, Ergocalciferol, (DRISDOL) 1.25 MG (50000 UNIT) CAPS capsule Take one tablet every 10 days 12 capsule 3   zolpidem (AMBIEN) 10 MG tablet TAKE 1 TABLET BY MOUTH EVERY DAY AT BEDTIME AS NEEDED FOR SLEEP 90 tablet 0   diclofenac (FLECTOR) 1.3 % PTCH Place onto the skin. (Patient not taking: Reported on 05/20/2021)     No facility-administered medications prior to visit.    Allergies  Allergen Reactions   Trazodone And Nefazodone Other (See Comments)    Passes out    ROS Review of Systems Review of Systems:  A fourteen system review of systems was performed and found to be positive as per HPI.   Objective:    Physical Exam General:  Well Developed, well nourished, appropriate for stated age.  Neuro:  Alert and oriented,  extra-ocular muscles intact  HEENT:  Normocephalic, atraumatic, neck supple Skin:  no gross rash, warm, pink. Cardiac:  RRR, S1 S2 wnl's  Respiratory:  CTA B/L, Not using accessory muscles, speaking in full sentences- unlabored. Vascular:  Ext warm, no cyanosis apprec.; cap RF less 2 sec. Psych:  No HI/SI, judgement and insight good, Euthymic mood. Full Affect.  BP (!) 92/58   Pulse 67   Temp (!) 97.5 F (36.4 C)   Ht 5' 5"  (1.651 m)   Wt 141 lb 9.6 oz (64.2 kg)   SpO2 99%   BMI 23.56 kg/m  Wt Readings from Last 3 Encounters:  05/20/21 141 lb 9.6 oz (64.2 kg)  11/07/20 150 lb (68 kg)  10/25/20 150 lb (68 kg)     Health Maintenance Due  Topic Date Due   PNEUMOCOCCAL POLYSACCHARIDE VACCINE AGE 37-64 HIGH RISK  Never done   COVID-19 Vaccine (1) Never done   HIV Screening  Never done    Hepatitis C Screening  Never done   Zoster Vaccines- Shingrix (1 of 2) Never done   Pneumococcal Vaccine 48-2 Years old (2 - PPSV23 or PCV20) 04/18/2016   OPHTHALMOLOGY EXAM  04/08/2019   INFLUENZA VACCINE  05/13/2021   FOOT EXAM  05/14/2021  There are no preventive care reminders to display for this patient.  Lab Results  Component Value Date   TSH 2.760 03/03/2019   Lab Results  Component Value Date   WBC 5.7 03/03/2019   HGB 12.7 (L) 03/03/2019   HCT 36.3 (L) 03/03/2019   MCV 84 03/03/2019   PLT 235 03/03/2019   Lab Results  Component Value Date   NA 141 05/14/2020   K 4.6 05/14/2020   CO2 26 05/14/2020   GLUCOSE 128 (H) 05/14/2020   BUN 13 05/14/2020   CREATININE 0.94 05/14/2020   BILITOT 1.0 08/10/2020   ALKPHOS 43 (L) 05/14/2020   AST 19 08/10/2020   ALT 28 08/10/2020   PROT 7.0 08/10/2020   ALBUMIN 5.0 (H) 05/14/2020   CALCIUM 9.8 05/14/2020   ANIONGAP 8 05/05/2018   Lab Results  Component Value Date   CHOL 110 08/17/2020   Lab Results  Component Value Date   HDL 29 (L) 08/17/2020   Lab Results  Component Value Date   LDLCALC 61 08/17/2020   Lab Results  Component Value Date   TRIG 109 08/17/2020   Lab Results  Component Value Date   CHOLHDL 3.8 08/17/2020   Lab Results  Component Value Date   HGBA1C 6.5 (A) 05/20/2021      Assessment & Plan:   Problem List Items Addressed This Visit       Endocrine   Diabetes mellitus ([place]) - Primary    -A1c today 6.5, mildly improved form 6.6. -Continue current medication regimen. Continue low carbohydrate/glucose diet and monitor for hypoglycemia.  -UA microalbumin collected today, A:C 30-300 mg/g. Recommend to consider low dose ACEi or ARB. -Will continue to monitor.       Relevant Orders   Comp Met (CMET)   POCT UA - Microalbumin (Completed)   POCT glycosylated hemoglobin (Hb A1C) (Completed)   Hyperlipidemia associated with type 2 diabetes mellitus ([place])    -Will collect direct LDL and  CMP to monitor ALT. -Continue current medication regimen. -Continue heart healthy diet. -Will continue to monitor.       Relevant Orders   Direct LDL     Nervous and Auditory   Lumbosacral spondylosis with radiculopathy    -Followed by Neurosurgery. -Encourage to continue with stretches and water aerobics.         Other   Insomnia    -Good sleep hygiene discussed. Unstable insomnia secondary to lumbar pain. Continue current medication regimen.       Vitamin D deficiency    -Will repeat Vitamin D. Pending lab results will make treatment adjustments if indicated.        Relevant Orders   Vitamin D (25 hydroxy)    No orders of the defined types were placed in this encounter.   Follow-up: Return in about 4 months (around 09/19/2021) for CPE.   Note:  This note was prepared with assistance of Dragon voice recognition software. Occasional wrong-word or sound-a-like substitutions may have occurred due to the inherent limitations of voice recognition software.  Lorrene Reid, PA-C

## 2021-05-20 NOTE — Assessment & Plan Note (Signed)
-  Will collect direct LDL and CMP to monitor ALT. -Continue current medication regimen. -Continue heart healthy diet. -Will continue to monitor.

## 2021-05-20 NOTE — Patient Instructions (Signed)

## 2021-05-20 NOTE — Assessment & Plan Note (Signed)
-  A1c today 6.5, mildly improved form 6.6. -Continue current medication regimen. Continue low carbohydrate/glucose diet and monitor for hypoglycemia.  -UA microalbumin collected today, A:C 30-300 mg/g. Recommend to consider low dose ACEi or ARB. -Will continue to monitor.

## 2021-05-20 NOTE — Assessment & Plan Note (Signed)
-  Followed by Neurosurgery. -Encourage to continue with stretches and water aerobics.

## 2021-05-20 NOTE — Assessment & Plan Note (Signed)
-  Will repeat Vitamin D. Pending lab results will make treatment adjustments if indicated.

## 2021-05-21 LAB — COMPREHENSIVE METABOLIC PANEL
ALT: 26 IU/L (ref 0–44)
AST: 18 IU/L (ref 0–40)
Albumin/Globulin Ratio: 2.3 — ABNORMAL HIGH (ref 1.2–2.2)
Albumin: 4.8 g/dL (ref 3.8–4.9)
Alkaline Phosphatase: 41 IU/L — ABNORMAL LOW (ref 44–121)
BUN/Creatinine Ratio: 15 (ref 9–20)
BUN: 12 mg/dL (ref 6–24)
Bilirubin Total: 1.1 mg/dL (ref 0.0–1.2)
CO2: 25 mmol/L (ref 20–29)
Calcium: 9.4 mg/dL (ref 8.7–10.2)
Chloride: 104 mmol/L (ref 96–106)
Creatinine, Ser: 0.79 mg/dL (ref 0.76–1.27)
Globulin, Total: 2.1 g/dL (ref 1.5–4.5)
Glucose: 112 mg/dL — ABNORMAL HIGH (ref 65–99)
Potassium: 4.3 mmol/L (ref 3.5–5.2)
Sodium: 141 mmol/L (ref 134–144)
Total Protein: 6.9 g/dL (ref 6.0–8.5)
eGFR: 106 mL/min/{1.73_m2} (ref >59)

## 2021-05-21 LAB — VITAMIN D 25 HYDROXY (VIT D DEFICIENCY, FRACTURES): Vit D, 25-Hydroxy: 71.7 ng/mL (ref 30.0–100.0)

## 2021-05-21 LAB — LDL CHOLESTEROL, DIRECT: LDL Direct: 64 mg/dL (ref 0–99)

## 2021-06-21 ENCOUNTER — Other Ambulatory Visit: Payer: Self-pay | Admitting: Physician Assistant

## 2021-06-21 DIAGNOSIS — E781 Pure hyperglyceridemia: Secondary | ICD-10-CM

## 2021-06-21 DIAGNOSIS — E1169 Type 2 diabetes mellitus with other specified complication: Secondary | ICD-10-CM

## 2021-06-21 DIAGNOSIS — E786 Lipoprotein deficiency: Secondary | ICD-10-CM

## 2021-06-26 ENCOUNTER — Other Ambulatory Visit: Payer: Self-pay | Admitting: Physician Assistant

## 2021-06-26 DIAGNOSIS — G4700 Insomnia, unspecified: Secondary | ICD-10-CM

## 2021-08-29 ENCOUNTER — Other Ambulatory Visit: Payer: Self-pay | Admitting: Physician Assistant

## 2021-08-29 DIAGNOSIS — E781 Pure hyperglyceridemia: Secondary | ICD-10-CM

## 2021-08-29 DIAGNOSIS — E785 Hyperlipidemia, unspecified: Secondary | ICD-10-CM

## 2021-08-29 DIAGNOSIS — E786 Lipoprotein deficiency: Secondary | ICD-10-CM

## 2021-08-29 DIAGNOSIS — G4700 Insomnia, unspecified: Secondary | ICD-10-CM

## 2021-09-01 ENCOUNTER — Other Ambulatory Visit: Payer: Self-pay | Admitting: Physician Assistant

## 2021-09-01 DIAGNOSIS — G4700 Insomnia, unspecified: Secondary | ICD-10-CM

## 2021-09-02 ENCOUNTER — Other Ambulatory Visit: Payer: Self-pay | Admitting: Physician Assistant

## 2021-09-02 DIAGNOSIS — G4700 Insomnia, unspecified: Secondary | ICD-10-CM

## 2021-09-04 ENCOUNTER — Ambulatory Visit: Payer: Managed Care, Other (non HMO) | Admitting: Physician Assistant

## 2021-09-04 ENCOUNTER — Encounter: Payer: Self-pay | Admitting: Physician Assistant

## 2021-09-04 ENCOUNTER — Other Ambulatory Visit: Payer: Self-pay

## 2021-09-04 VITALS — BP 110/69 | HR 78 | Temp 98.0°F | Ht 65.0 in | Wt 149.0 lb

## 2021-09-04 DIAGNOSIS — H60332 Swimmer's ear, left ear: Secondary | ICD-10-CM | POA: Diagnosis not present

## 2021-09-04 MED ORDER — BETAMETHASONE DIPROPIONATE 0.05 % EX CREA
TOPICAL_CREAM | Freq: Two times a day (BID) | CUTANEOUS | 0 refills | Status: DC
Start: 2021-09-04 — End: 2021-12-19

## 2021-09-04 MED ORDER — OFLOXACIN 0.3 % OT SOLN: 10.0000 [drp] | Freq: Every day | OTIC | 0 refills | Status: DC

## 2021-09-04 NOTE — Progress Notes (Signed)
Acute Office Visit  Subjective:    Patient ID: Richard Noble, male    DOB: 1966-04-03, 55 y.o.   MRN: [id number]  Chief Complaint  Patient presents with   Acute Visit    HPI Patient is in today for c/o left ear pain. Patient has been swimming 3-4 days per week. Throbbing ear pain started yesterday which radiates to neck. Denies otorrhea, fever, or respiratory symptoms. Patient reports does not wear protection. Also requesting refill of topical lotion he uses intermittent for itchy and dry scalp lesion.   Past Medical History:  Diagnosis Date   Anemia    as child    Anxiety    uses valium for anxiety related to pain.    Arthritis    stenosis, cervical area, arthritis  - spine    Diabetes mellitus without complication ([place])    treated with diet only- undercontrol per pt    History of exercise stress test     in [place] 20 yrs[place] ago had stress test & he was followed by cardiologist for a couple yrs. , but hasn't been referred since he has lived here.    History of kidney stones    found incidentally - no problems, just stable    Hyperlipidemia    borderline no meds   Sleep concern    study done in [place], told that it was normal, sleep issue related to pain.      Past Surgical History:  Procedure Laterality Date   ANTERIOR CERVICAL DECOMP/DISCECTOMY FUSION N/A 09/01/2016   Procedure: Cervical five-six, Cervical six-seven ANTERIOR CERVICAL DECOMPRESSION/DISCECTOMY FUSION;  Surgeon: Kevan Ny [name], MD;  Location: [facility];  Service: Neurosurgery;  Laterality: N/A;   APPENDECTOMY  1984   BACK SURGERY  2012    L5-S1 fusion   BACK SURGERY  2016   C6-7 [facility]compression. [facility])   [place]  [place]   [place]- normal per [place]     Family History  Problem Relation Age of Onset   Diabetes Mother    Diabetes Brother    Diabetes Sister    Diabetes Brother    Colon cancer Neg Hx    Colon polyps Neg Hx    Esophageal cancer Neg Hx    Rectal cancer Neg Hx     Stomach cancer Neg Hx     Social History   Socioeconomic History   Marital status: Married    Spouse name: Not on file   Number of children: Not on file   Years of education: Not on file   Highest education level: Not on file  Occupational History   Not on file  Tobacco Use   Smoking status: Never   Smokeless tobacco: Never  Substance and Sexual Activity   Alcohol use: Yes    Alcohol/week: 0.0 standard drinks    Comment: weekly   Drug use: No   Sexual activity: Not on file  Other Topics Concern   Not on file  Social History Narrative   Married - [place], 3 children   On Disability for lumbar DDD   Born in [place] - moved to [place] when 55 years old   Social Determinants of Radio broadcast assistant Strain: Not on file  Food Insecurity: Not on file  Transportation Needs: Not on file  Physical Activity: Not on file  Stress: Not on file  Social Connections: Not on file  Intimate Partner Violence: Not on file    Outpatient Medications Prior to Visit  Medication Sig Dispense Refill   atorvastatin (LIPITOR) 10 MG tablet TAKE 1/2 TABLET BY MOUTH EVERY DAY AT BEDTIME 45 tablet 0   DULoxetine (CYMBALTA) 30 MG capsule Take 30 mg by mouth 2 (two) times daily.     metFORMIN (GLUCOPHAGE) 500 MG tablet TAKE 1 TABLET (500 MG TOTAL) BY MOUTH DAILY. 90 tablet 0   Multiple Vitamin (MULTIVITAMIN) tablet Take 1 tablet by mouth daily.     omega-3 acid ethyl esters (LOVAZA) 1 g capsule Take 2 capsules (2 g total) by mouth 2 (two) times daily. 360 capsule 1   TURMERIC PO Take 1 tablet by mouth daily.     zolpidem (AMBIEN) 10 MG tablet TAKE 1 TABLET BY MOUTH EVERY DAY AT BEDTIME AS NEEDED 90 tablet 0   Continuous Blood Gluc Receiver (DEXCOM G6 RECEIVER) DEVI Use to check blood sugars continuously. 1 each 0   Continuous Blood Gluc Sensor (DEXCOM G6 SENSOR) MISC Use to check blood sugars continuously. Change sensor every 7 days. 12 each 1   diclofenac (FLECTOR) 1.3 % PTCH Place onto the skin.  (Patient not taking: Reported on 05/20/2021)     GLUCOSAMINE-CHONDROITIN PO Take 1,500 mg by mouth daily.     Magnesium 250 MG TABS Take by mouth.     traMADol (ULTRAM) 50 MG tablet Take by mouth 3 (three) times daily.     Vitamin D, Ergocalciferol, (DRISDOL) 1.25 MG (50000 UNIT) CAPS capsule Take one tablet every 10 days 12 capsule 3   No facility-administered medications prior to visit.    Allergies  Allergen Reactions   Trazodone And Nefazodone Other (See Comments)    Passes out    Review of Systems Review of Systems:  A fourteen system review of systems was performed and found to be positive as per HPI.    Objective:    Physical Exam General:  Well Developed, well nourished, appropriate for stated age.  Neuro:  Alert and oriented,  extra-ocular muscles intact  HEENT:  Normocephalic, atraumatic, neck supple, no sinus tenderness, erythema and mild swelling of left external ear canal, normal TM of right ear, positive Tragus and Pinna sign's for left ear (negative right ear), no adenopathy  Skin:  no gross rash, warm, pink. Respiratory:  Not using accessory muscles, speaking in full sentences- unlabored. Vascular:  Ext warm, no cyanosis apprec.; cap RF less 2 sec. Psych:  No HI/SI, judgement and insight good, Euthymic mood. Full Affect.  BP 110/69   Pulse 78   Temp 98 F (36.7 C)   Ht 5' 5"  (1.651 m)   Wt 149 lb (67.6 kg)   SpO2 98%   BMI 24.79 kg/m  Wt Readings from Last 3 Encounters:  09/04/21 149 lb (67.6 kg)  05/20/21 141 lb 9.6 oz (64.2 kg)  11/07/20 150 lb (68 kg)    Health Maintenance Due  Topic Date Due   HIV Screening  Never done   Hepatitis C Screening  Never done   Zoster Vaccines- Shingrix (1 of 2) Never done   Pneumococcal Vaccine 72-59 Years old (2 - PPSV23 if available, else PCV20) 04/18/2016   OPHTHALMOLOGY EXAM  04/08/2019   FOOT EXAM  05/14/2021    There are no preventive care reminders to display for this patient.   Lab Results  Component  Value Date   TSH 2.760 03/03/2019   Lab Results  Component Value Date   WBC 5.7 03/03/2019   HGB 12.7 (L) 03/03/2019   HCT 36.3 (L) 03/03/2019   MCV  84 03/03/2019   PLT 235 03/03/2019   Lab Results  Component Value Date   NA 141 05/20/2021   K 4.3 05/20/2021   CO2 25 05/20/2021   GLUCOSE 112 (H) 05/20/2021   BUN 12 05/20/2021   CREATININE 0.79 05/20/2021   BILITOT 1.1 05/20/2021   ALKPHOS 41 (L) 05/20/2021   AST 18 05/20/2021   ALT 26 05/20/2021   PROT 6.9 05/20/2021   ALBUMIN 4.8 05/20/2021   CALCIUM 9.4 05/20/2021   ANIONGAP 8 05/05/2018   EGFR 106 05/20/2021   Lab Results  Component Value Date   CHOL 110 08/17/2020   Lab Results  Component Value Date   HDL 29 (L) 08/17/2020   Lab Results  Component Value Date   LDLCALC 61 08/17/2020   Lab Results  Component Value Date   TRIG 109 08/17/2020   Lab Results  Component Value Date   CHOLHDL 3.8 08/17/2020   Lab Results  Component Value Date   HGBA1C 6.5 (A) 05/20/2021       Assessment & Plan:   Problem List Items Addressed This Visit   None Visit Diagnoses     Acute swimmer's ear of left side    -  Primary   Relevant Medications   ofloxacin (FLOXIN OTIC) 0.3 % OTIC solution      Patient has s/sx consistent with AOE of left ear, will start topical antibiotic therapy. Discussed preventative measures such as wearing ear plugs when swimming and instilling drops of white vinegar and rubbing alcohol after swimming. Follow up if symptoms fail to improve or worsen.   Meds ordered this encounter  Medications   betamethasone dipropionate 0.05 % cream    Sig: Apply topically 2 (two) times daily.    Dispense:  30 g    Refill:  0    Order Specific Question:   Supervising Provider    Answer:   Beatrice Lecher D [[id number]]   ofloxacin (FLOXIN OTIC) 0.3 % OTIC solution    Sig: Place 10 drops into the left ear daily.    Dispense:  5 mL    Refill:  0    Order Specific Question:   Supervising Provider     Answer:   Beatrice Lecher D [[id number]]      Lorrene Reid, PA-C

## 2021-09-04 NOTE — Patient Instructions (Signed)
Otitis Externa Otitis externa is an infection of the outer ear canal. The outer ear canal is the area between the outside of the ear and the eardrum. Otitis externa is sometimes called swimmer's ear. What are the causes? Common causes of this condition include: Swimming in dirty water. Moisture in the ear. An injury to the inside of the ear. An object stuck in the ear. A cut or scrape on the outside of the ear or in the ear canal. What increases the risk? You are more likely to get this condition if you go swimming often. What are the signs or symptoms? Itching in the ear. This is often the first symptom. Swelling of the ear. Redness in the ear. Ear pain. The pain may get worse when you pull on your ear. Pus coming from the ear. How is this treated? This condition may be treated with: Antibiotic ear drops. These are often given for 10-14 days. Medicines to reduce itching and swelling. Follow these instructions at home: If you were prescribed antibiotic ear drops, use them as told by your doctor. Do not stop using them even if you start to feel better. Take over-the-counter and prescription medicines only as told by your doctor. Avoid getting water in your ears as told by your doctor. You may be told to avoid swimming or water sports for a few days. Keep all follow-up visits. How is this prevented? Keep your ears dry. Use the corner of a towel to dry your ears after you swim or bathe. Try not to scratch or put things in your ear. Doing these things makes it easier for germs to grow in your ear. Avoid swimming in lakes, dirty water, or swimming pools that may not have the right amount of a chemical called chlorine. Contact a doctor if: You have a fever. Your ear is still red, swollen, or painful after 3 days. You still have pus coming from your ear after 3 days. Your redness, swelling, or pain gets worse. You have a very bad headache. Get help right away if: You have redness,  swelling, and pain or tenderness behind your ear. Summary Otitis externa is an infection of the outer ear canal. Symptoms include pain, redness, and swelling of the ear. If you were prescribed antibiotic ear drops, use them as told by your doctor. Do not stop using them even if you start to feel better. Try not to scratch or put things in your ear. This information is not intended to replace advice given to you by your health care provider. Make sure you discuss any questions you have with your health care provider. Document Revised: 12/12/2020 Document Reviewed: 12/12/2020 Elsevier Patient Education  [place].

## 2021-09-09 ENCOUNTER — Telehealth: Payer: Self-pay | Admitting: Physician Assistant

## 2021-09-09 DIAGNOSIS — H60332 Swimmer's ear, left ear: Secondary | ICD-10-CM

## 2021-09-09 NOTE — Telephone Encounter (Signed)
Patient called and stated he needs a refill on the eardrops he was given last week because he ended up using it for both ears and also dog chewed up the bottle. Please advise [phone]

## 2021-09-10 MED ORDER — OFLOXACIN 0.3 % OT SOLN: 10.0000 [drp] | Freq: Every day | OTIC | 0 refills | Status: DC

## 2021-09-10 NOTE — Telephone Encounter (Signed)
Refill sent to pharmacy. AS< CMA 

## 2021-09-20 ENCOUNTER — Ambulatory Visit (INDEPENDENT_AMBULATORY_CARE_PROVIDER_SITE_OTHER): Payer: Managed Care, Other (non HMO) | Admitting: Physician Assistant

## 2021-09-20 ENCOUNTER — Encounter: Payer: Self-pay | Admitting: Physician Assistant

## 2021-09-20 ENCOUNTER — Other Ambulatory Visit: Payer: Self-pay

## 2021-09-20 VITALS — BP 110/71 | HR 68 | Temp 98.0°F | Ht 65.0 in | Wt 153.0 lb

## 2021-09-20 DIAGNOSIS — E559 Vitamin D deficiency, unspecified: Secondary | ICD-10-CM | POA: Diagnosis not present

## 2021-09-20 DIAGNOSIS — Z23 Encounter for immunization: Secondary | ICD-10-CM | POA: Diagnosis not present

## 2021-09-20 DIAGNOSIS — E785 Hyperlipidemia, unspecified: Secondary | ICD-10-CM

## 2021-09-20 DIAGNOSIS — Z0000 Encounter for general adult medical examination without abnormal findings: Secondary | ICD-10-CM

## 2021-09-20 DIAGNOSIS — E1169 Type 2 diabetes mellitus with other specified complication: Secondary | ICD-10-CM

## 2021-09-20 NOTE — Patient Instructions (Signed)

## 2021-09-20 NOTE — Progress Notes (Signed)
Male physical   Impression and Recommendations:    1. Healthcare maintenance   2. Type 2 diabetes mellitus with other specified complication, without long-term current use of insulin ([place])   3. Hyperlipidemia associated with type 2 diabetes mellitus ([place])   4. Vitamin D deficiency   5. Need for pneumococcal vaccination   6. Need for shingles vaccine      1) Anticipatory Guidance: Skin CA prevention- recommend to use sunscreen when outside along with skin surveillance; eat a balanced and modest diet; physical activity at least 25 minutes per day or minimum of 150 min/ week moderate to intense activity.  2) Immunizations / Screenings / Labs:   All immunizations are up-to-date per recommendations or will be updated today if pt allows.    - Patient understands with dental and vision screens they will schedule independently.  - Will obtain CBC, CMP, HgA1c, Lipid panel, TSH when fasting, advised to schedule lab visit.  - Agreeable to Shingrix and Prevnar 20 vaccine. Declined Hep C and HIV screenings. - Will request most recent eye exam from [facility] - UTD colonoscopy.   3) Weight:  Recommend to continue to improve diet habits to improve overall feelings of well being and objective health data. Improve nutrient density of diet through increasing intake of fruits and vegetables and decreasing saturated fats, white flour products and refined sugars.   4) Healthcare Maintenance: -Recommend to stay well hydrated. -Continue water aerobics. Discussed preventative strategies for AOE such as 1-2 drops of white vinegar 1/3 in rubbing alcohol 2/3. -Follow up in 4 months for DM, HLD, insomnia and second dose of Shingrix     Orders Placed This Encounter  Procedures   Varicella-zoster vaccine IM   Pneumococcal conjugate vaccine 20-valent (Prevnar 20)     No orders of the defined types were placed in this encounter.    Return in about 4 months (around 01/19/2022) for DM, HLD,  insomnia, second shingles vaccine; lab visit next week for FBW inc Vit D.     Gross side effects, risk and benefits, and alternatives of medications discussed with patient.  Patient is aware that all medications have potential side effects and we are unable to predict every side effect or drug-drug interaction that may occur.  Expresses verbal understanding and consents to current therapy plan and treatment regimen.  Please see AVS handed out to patient at the end of our visit for further patient instructions/ counseling done pertaining to today's office visit.       Subjective:        CC: CPE   HPI: Richard Noble is a 55 y.o. male who presents to [place] at [facility] today for a yearly health maintenance exam.     Health Maintenance Summary  - Reviewed and updated, unless pt declines services.  Last Cologuard or Colonoscopy:  11/07/2020- repeat in 7 years Tobacco History Reviewed:   yes, never a smoker Abdominal Ultrasound:   n/a  CT scan for screening lung CA: n/a Alcohol / drug use:    No concerns, no excessive use / no use Exercise Habits:   water aerobics  Dental Home:  y Eye exams: yes,  eye (requesting most recent eye exam)  Male history: STD concerns:   none Additional penile/ urinary concerns:  none   Additional concerns beyond Health Maintenance issues:  none    Immunization History  Administered Date(s) Administered   Influenza Inj Mdck Quad Pf 07/11/2019  Influenza,inj,Quad PF,6+ Mos 09/28/2015, 08/12/2018   Influenza-Unspecified 08/07/2016, 08/23/2021   PFIZER(Purple Top)SARS-COV-2 Vaccination 01/02/2020, 01/30/2020, 07/28/2020   PNEUMOCOCCAL CONJUGATE-20 09/20/2021   Pfizer Covid-19 Vaccine Bivalent Booster 32yrs & up 08/23/2021   Pneumococcal Conjugate-13 04/19/2015   Tdap 11/03/2018   Zoster Recombinat (Shingrix) 09/20/2021     Health Maintenance  Topic Date Due   HIV Screening  Never done   Hepatitis C  Screening  Never done   OPHTHALMOLOGY EXAM  04/08/2019   FOOT EXAM  05/14/2021   Zoster Vaccines- Shingrix (2 of 2) 11/15/2021   HEMOGLOBIN A1C  11/20/2021   URINE MICROALBUMIN  05/20/2022   COLONOSCOPY (Pts 45-90yrs Insurance coverage will need to be confirmed)  11/08/2027   TETANUS/TDAP  11/03/2028   Pneumococcal Vaccine 79-2 Years old  Completed   INFLUENZA VACCINE  Completed   COVID-19 Vaccine  Completed   HPV VACCINES  Aged Out       Wt Readings from Last 3 Encounters:  09/20/21 153 lb (69.4 kg)  09/04/21 149 lb (67.6 kg)  05/20/21 141 lb 9.6 oz (64.2 kg)   BP Readings from Last 3 Encounters:  09/20/21 110/71  09/04/21 110/69  05/20/21 (!) 92/58   Pulse Readings from Last 3 Encounters:  09/20/21 68  09/04/21 78  05/20/21 67    Patient Active Problem List   Diagnosis Date Noted   Elevated blood-pressure reading, without diagnosis of hypertension 11/20/2020   Other long term (current) drug therapy 09/27/2019   High triglycerides 03/22/2019   Dizziness 08/12/2018   Orthostatic hypotension 08/12/2018   Dehydration, mild 08/12/2018   Vitamin D deficiency 01/01/2018   Hyperlipidemia associated with type 2 diabetes mellitus ([place]) 01/01/2018   Low testosterone level in male 01/01/2018   Muscle pain 08/13/2017   Lumbosacral spondylosis with radiculopathy 11/27/2016   Low HDL (under 40) 11/05/2016   Diabetes mellitus ([place]) 11/05/2016   Status post colonoscopy 2008 11/05/2016   Status post lumbar spinal fusion 11/05/2016   Not currently working due to disabled status 11/05/2016   Status post cervical spinal fusion 11/05/2016   Long-term use of high-risk medication 11/05/2016   Muscle spasms of neck, upper back & lower back 11/05/2016   Osteoarthritis of spine with radiculopathy, cervical region 09/01/2016   Opioid dependence ([place]) 03/18/2016   DDD (degenerative disc disease), lumbar 09/28/2015   Insomnia 09/28/2015   Anxiety 09/27/2015   Lumbar post-laminectomy  syndrome 09/27/2015   DDD (degenerative disc disease), cervical 09/05/2015   Chronic pain associated with significant psychosocial dysfunction 09/05/2015   Chronic pain syndrome 09/05/2015   Cervical stenosis of spine 09/05/2015   Numbness of upper limb 08/23/2015   Cervical pain (neck) 08/07/2015    Past Medical History:  Diagnosis Date   Anemia    as child    Anxiety    uses valium for anxiety related to pain.    Arthritis    stenosis, cervical area, arthritis  - spine    Diabetes mellitus without complication ([place])    treated with diet only- undercontrol per pt    History of exercise stress test     in [place]rs[place] ago had stress test & he was followed by cardiologist for a couple yrs. , but hasn't been referred since he has lived here.    History of kidney stones    found incidentally - no problems, just stable    Hyperlipidemia    borderline no meds   Sleep concern    study done in [place],  told that it was normal, sleep issue related to pain.      Past Surgical History:  Procedure Laterality Date   ANTERIOR CERVICAL DECOMP/DISCECTOMY FUSION N/A 09/01/2016   Procedure: Cervical five-six, Cervical six-seven ANTERIOR CERVICAL DECOMPRESSION/DISCECTOMY FUSION;  Surgeon: Kevan Ny [name], MD;  Location: [facility];  Service: Neurosurgery;  Laterality: N/A;   APPENDECTOMY  1984   BACK SURGERY  2012    L5-S1 fusion   BACK SURGERY  2016   [facility]7 [facility]   [place]OSCOPY  [place]   [place]- normal per [place]     Family History  Problem Relation Age of Onset   Diabetes Mother    Diabetes Brother    Diabetes Sister    Diabetes Brother    Colon cancer Neg Hx    Colon polyps Neg Hx    Esophageal cancer Neg Hx    Rectal cancer Neg Hx    Stomach cancer Neg Hx     Social History   Substance and Sexual Activity  Drug Use No  ,  Social History   Substance and Sexual Activity  Alcohol Use Yes   Alcohol/week: 0.0 standard drinks   Comment: weekly   ,  Social History   Tobacco Use  Smoking Status Never  Smokeless Tobacco Never  ,  Social History   Substance and Sexual Activity  Sexual Activity Not on file    Patient's Medications  New Prescriptions   No medications on file  Previous Medications   ATORVASTATIN (LIPITOR) 10 MG TABLET    TAKE 1/2 TABLET BY MOUTH EVERY DAY AT BEDTIME   BETAMETHASONE DIPROPIONATE 0.05 % CREAM    Apply topically 2 (two) times daily.   DULOXETINE (CYMBALTA) 30 MG CAPSULE    Take 30 mg by mouth 2 (two) times daily.   METFORMIN (GLUCOPHAGE) 500 MG TABLET    TAKE 1 TABLET (500 MG TOTAL) BY MOUTH DAILY.   MULTIPLE VITAMIN (MULTIVITAMIN) TABLET    Take 1 tablet by mouth daily.   OFLOXACIN (FLOXIN OTIC) 0.3 % OTIC SOLUTION    Place 10 drops into the left ear daily.   OMEGA-3 ACID ETHYL ESTERS (LOVAZA) 1 G CAPSULE    Take 2 capsules (2 g total) by mouth 2 (two) times daily.   TURMERIC PO    Take 1 tablet by mouth daily.   ZOLPIDEM (AMBIEN) 10 MG TABLET    TAKE 1 TABLET BY MOUTH EVERY DAY AT BEDTIME AS NEEDED  Modified Medications   No medications on file  Discontinued Medications   No medications on file    Trazodone and nefazodone  Review of Systems: General:   Denies fever, chills, unexplained weight loss.  Optho/Auditory:   Denies visual changes, blurred vision/LOV Respiratory:   Denies SOB, DOE more than baseline levels.   Cardiovascular:   Denies chest pain, palpitations, new onset peripheral edema  Gastrointestinal:   Denies nausea, vomiting, diarrhea.  Genitourinary: Denies dysuria, freq/ urgency, flank pain  Endocrine:     Denies hot or cold intolerance, polyuria, polydipsia. Musculoskeletal:   Denies unexplained myalgias, joint swelling, unexplained arthralgias, gait problems.  Skin:  Denies rash, suspicious lesions Neurological:     Denies dizziness, unexplained weakness, numbness  Psychiatric/Behavioral:   Denies mood changes, suicidal or homicidal ideations,  hallucinations    Objective:     Blood pressure 110/71, pulse 68, temperature 98 F (36.7 C), height 5\' 5"  (1.651 m), weight 153 lb (69.4 kg), SpO2 98 %. Body mass index is 25.46  kg/m. General Appearance:    Alert, cooperative, no distress, appears stated age  Head:    Normocephalic, without obvious abnormality, atraumatic  Eyes:    PERRL, conjunctiva/corneas clear, EOM's intact, fundi    benign, both eyes  Ears:    Normal TM's and external ear canals, both ears  Nose:   Nares normal, septum midline, mucosa normal, no drainage    or sinus tenderness  Throat:   Lips w/o lesion, mucosa moist, and tongue normal; teeth and gums normal  Neck:   Supple, symmetrical, trachea midline, no adenopathy;    thyroid:  no enlargement/tenderness/nodules; no JVD  Back:     Symmetric, no curvature, ROM normal, no CVA tenderness  Lungs:     Clear to auscultation bilaterally, respirations unlabored, no  Wh/ R/ R  Chest Wall:    No tenderness or gross deformity; normal excursion   Heart:    Regular rate and rhythm, S1 and S2 normal, no murmur  Abdomen:     Soft, non-tender, bowel sounds active all four quadrants, No G/R/R, no masses, no organomegaly  Genitalia:   Deferred.  Rectal:   Deferred.   Extremities:   Extremities normal, atraumatic, no cyanosis or gross edema  Pulses:   2+ and symmetric all extremities  Skin:   Warm, dry, Skin color, texture, turgor normal, no obvious rashes or lesions  M-Sk:   Ambulates * 4 w/o difficulty, no gross deformities, tone WNL  Neurologic:   CNII-XII grossly intact Psych:  No HI/SI, judgement and insight good, Euthymic mood. Full Affect.

## 2021-09-27 ENCOUNTER — Other Ambulatory Visit: Payer: Managed Care, Other (non HMO)

## 2021-09-27 ENCOUNTER — Other Ambulatory Visit: Payer: Self-pay

## 2021-09-27 DIAGNOSIS — Z0000 Encounter for general adult medical examination without abnormal findings: Secondary | ICD-10-CM

## 2021-09-27 DIAGNOSIS — E1169 Type 2 diabetes mellitus with other specified complication: Secondary | ICD-10-CM

## 2021-09-28 LAB — HEMOGLOBIN A1C
Est. average glucose Bld gHb Est-mCnc: 160 mg/dL
Hgb A1c MFr Bld: 7.2 % — ABNORMAL HIGH (ref 4.8–5.6)

## 2021-09-28 LAB — COMPREHENSIVE METABOLIC PANEL
ALT: 32 IU/L (ref 0–44)
AST: 21 IU/L (ref 0–40)
Albumin/Globulin Ratio: 2.2 (ref 1.2–2.2)
Albumin: 4.8 g/dL (ref 3.8–4.9)
Alkaline Phosphatase: 47 IU/L (ref 44–121)
BUN/Creatinine Ratio: 14 (ref 9–20)
BUN: 14 mg/dL (ref 6–24)
Bilirubin Total: 0.8 mg/dL (ref 0.0–1.2)
CO2: 26 mmol/L (ref 20–29)
Calcium: 9.7 mg/dL (ref 8.7–10.2)
Chloride: 98 mmol/L (ref 96–106)
Creatinine, Ser: 1.03 mg/dL (ref 0.76–1.27)
Globulin, Total: 2.2 g/dL (ref 1.5–4.5)
Glucose: 135 mg/dL — ABNORMAL HIGH (ref 70–99)
Potassium: 4.4 mmol/L (ref 3.5–5.2)
Sodium: 139 mmol/L (ref 134–144)
Total Protein: 7 g/dL (ref 6.0–8.5)
eGFR: 86 mL/min/{1.73_m2} (ref >59)

## 2021-09-28 LAB — TSH: TSH: 1.9 u[IU]/mL (ref 0.450–4.500)

## 2021-09-28 LAB — CBC WITH DIFFERENTIAL/PLATELET
Basophils Absolute: 0.1 10*3/uL (ref 0.0–0.2)
Basos: 1 %
EOS (ABSOLUTE): 0.3 10*3/uL (ref 0.0–0.4)
Eos: 4 %
Hematocrit: 41.3 % (ref 37.5–51.0)
Hemoglobin: 13.7 g/dL (ref 13.0–17.7)
Immature Grans (Abs): 0 10*3/uL (ref 0.0–0.1)
Immature Granulocytes: 1 %
Lymphocytes Absolute: 3.3 10*3/uL — ABNORMAL HIGH (ref 0.7–3.1)
Lymphs: 45 %
MCH: 29.1 pg (ref 26.6–33.0)
MCHC: 33.2 g/dL (ref 31.5–35.7)
MCV: 88 fL (ref 79–97)
Monocytes Absolute: 0.4 10*3/uL (ref 0.1–0.9)
Monocytes: 6 %
Neutrophils Absolute: 3 10*3/uL (ref 1.4–7.0)
Neutrophils: 43 %
Platelets: 268 10*3/uL (ref 150–450)
RBC: 4.7 x10E6/uL (ref 4.14–5.80)
RDW: 12.6 % (ref 11.6–15.4)
WBC: 7.1 10*3/uL (ref 3.4–10.8)

## 2021-09-28 LAB — LIPID PANEL
Chol/HDL Ratio: 4.5 ratio (ref 0.0–5.0)
Cholesterol, Total: 134 mg/dL (ref 100–199)
HDL: 30 mg/dL — ABNORMAL LOW (ref >39)
LDL Chol Calc (NIH): 83 mg/dL (ref 0–99)
Triglycerides: 115 mg/dL (ref 0–149)
VLDL Cholesterol Cal: 21 mg/dL (ref 5–40)

## 2021-10-03 ENCOUNTER — Other Ambulatory Visit: Payer: Self-pay | Admitting: Physician Assistant

## 2021-10-03 DIAGNOSIS — E781 Pure hyperglyceridemia: Secondary | ICD-10-CM

## 2021-10-03 DIAGNOSIS — E785 Hyperlipidemia, unspecified: Secondary | ICD-10-CM

## 2021-10-03 DIAGNOSIS — E786 Lipoprotein deficiency: Secondary | ICD-10-CM

## 2021-12-05 ENCOUNTER — Other Ambulatory Visit: Payer: Self-pay | Admitting: Physician Assistant

## 2021-12-05 DIAGNOSIS — E781 Pure hyperglyceridemia: Secondary | ICD-10-CM

## 2021-12-05 DIAGNOSIS — E786 Lipoprotein deficiency: Secondary | ICD-10-CM

## 2021-12-05 DIAGNOSIS — E1169 Type 2 diabetes mellitus with other specified complication: Secondary | ICD-10-CM

## 2021-12-05 DIAGNOSIS — E785 Hyperlipidemia, unspecified: Secondary | ICD-10-CM

## 2021-12-18 ENCOUNTER — Other Ambulatory Visit: Payer: Self-pay | Admitting: Physician Assistant

## 2021-12-18 DIAGNOSIS — G4700 Insomnia, unspecified: Secondary | ICD-10-CM

## 2021-12-19 NOTE — Telephone Encounter (Signed)
Patient has follow up appointment with [facility] 01/21/2022 ? ?

## 2022-01-02 ENCOUNTER — Other Ambulatory Visit: Payer: Self-pay | Admitting: Nurse Practitioner

## 2022-01-16 ENCOUNTER — Telehealth: Payer: Self-pay | Admitting: Physician Assistant

## 2022-01-16 ENCOUNTER — Other Ambulatory Visit: Payer: Self-pay

## 2022-01-16 DIAGNOSIS — E786 Lipoprotein deficiency: Secondary | ICD-10-CM

## 2022-01-16 DIAGNOSIS — E785 Hyperlipidemia, unspecified: Secondary | ICD-10-CM

## 2022-01-16 DIAGNOSIS — E781 Pure hyperglyceridemia: Secondary | ICD-10-CM

## 2022-01-16 MED ORDER — ATORVASTATIN CALCIUM 10 MG PO TABS: ORAL_TABLET | ORAL | 1 refills | Status: DC

## 2022-01-16 MED ORDER — DULOXETINE HCL 30 MG PO CPEP: 30.0000 mg | ORAL_CAPSULE | Freq: Two times a day (BID) | ORAL | 1 refills | Status: DC

## 2022-01-16 NOTE — Telephone Encounter (Signed)
Refill sent.

## 2022-01-16 NOTE — Telephone Encounter (Signed)
Patient requesting a refill of Atorvastatin and cymbalta. Please advise.  ?

## 2022-01-20 NOTE — Progress Notes (Signed)
?Established patient visit ? ? ?Patient: Richard Noble   DOB: May 24, 1966   56 y.o. Male  MRN: [id number] ?Visit Date: 01/21/2022 ? ?Chief Complaint  ?Patient presents with  ? Follow-up  ? Diabetes  ? Hyperlipidemia  ? Insomnia  ? ?Subjective  ?  ?HPI  ?Patient presents for follow-up on diabetes mellitus, hyperlipidemia, and insomnia. ? ?Diabetes: Pt denies increased urination or thirst. Pt reports medication compliance. No hypoglycemic events. Checking glucose at home. FBS range from 130-140. States has been under more personal stress and has not been swimming as much due to tennis elbow.  ? ?HLD: Pt taking medication as directed without issues. Denies muscle cramps, weakness or myalgias.  ? ?Insomnia: Patient reports gets 6-7 hours of sleep. Takes Ambien every night. No falls or issues with memory. ? ?Medications: ?Outpatient Medications Prior to Visit  ?Medication Sig  ? atorvastatin (LIPITOR) 10 MG tablet TAKE 1/2 TABLET BY MOUTH EVERY DAY AT BEDTIME  ? betamethasone dipropionate 0.05 % cream APPLY TOPICALLY TWICE A DAY  ? DULoxetine (CYMBALTA) 30 MG capsule Take 1 capsule (30 mg total) by mouth 2 (two) times daily.  ? Multiple Vitamin (MULTIVITAMIN) tablet Take 1 tablet by mouth daily.  ? omega-3 acid ethyl esters (LOVAZA) 1 g capsule TAKE 2 CAPSULES BY MOUTH 2 TIMES DAILY.  ? TURMERIC PO Take 1 tablet by mouth daily.  ? zolpidem (AMBIEN) 10 MG tablet TAKE 1 TABLET BY MOUTH EVERY DAY AT BEDTIME AS NEEDED  ? [DISCONTINUED] metFORMIN (GLUCOPHAGE) 500 MG tablet TAKE 1 TABLET (500 MG TOTAL) BY MOUTH DAILY.  ? [DISCONTINUED] ofloxacin (FLOXIN OTIC) 0.3 % OTIC solution Place 10 drops into the left ear daily.  ? ?No facility-administered medications prior to visit.  ? ? ?Review of Systems ?Review of Systems:  ?A fourteen system review of systems was performed and found to be positive as per HPI. ? ? ?  Objective  ?  ?BP 99/64   Pulse 66   Temp 97.9 ?F (36.6 ?C)   Ht 5' 5"  (1.651 m)   Wt 151 lb (68.5 kg)   SpO2 98%    BMI 25.13 kg/m?  ?BP Readings from Last 3 Encounters:  ?01/21/22 99/64  ?09/20/21 110/71  ?09/04/21 110/69  ? ?Wt Readings from Last 3 Encounters:  ?01/21/22 151 lb (68.5 kg)  ?09/20/21 153 lb (69.4 kg)  ?09/04/21 149 lb (67.6 kg)  ? ? ?Physical Exam  ?General:  Well Developed, well nourished, appropriate for stated age.  ?Neuro:  Alert and oriented,  extra-ocular muscles intact  ?HEENT:  Normocephalic, atraumatic, neck supple  ?Skin:  no gross rash, warm, pink. ?Cardiac:  RRR, S1 S2 ?Respiratory: CTA B/L  ?Vascular:  Ext warm, no cyanosis apprec.; cap RF less 2 sec. ?Psych:  No HI/SI, judgement and insight good, Euthymic mood. Full Affect. ? ? ?Results for orders placed or performed in visit on 01/21/22  ?POCT glycosylated hemoglobin (Hb A1C)  ?Result Value Ref Range  ? Hemoglobin A1C 7.2 (A) 4.0 - 5.6 %  ? HbA1c POC (<> result, manual entry)    ? HbA1c, POC (prediabetic range)    ? HbA1c, POC (controlled diabetic range)    ? ? Assessment & Plan  ?  ? ? ?Problem List Items Addressed This Visit   ? ?  ? Endocrine  ? Diabetes mellitus ([place]) - Primary  ?  -A1c unchanged at 7.2, goal <7.0. Discussed with patient adjusting metformin and is agreeable to increase to 500 mg BID. Discussed diabetic diet  and recommend to resume exercise regimen when able to. Will reassess A1c and medication therapy in 3 months. ?  ?  ? Relevant Medications  ? metFORMIN (GLUCOPHAGE) 500 MG tablet  ? Other Relevant Orders  ? POCT glycosylated hemoglobin (Hb A1C) (Completed)  ? Comp Met (CMET)  ? CBC w/Diff  ? Hyperlipidemia associated with type 2 diabetes mellitus ([place])  ?  -Last lipid panel: HDL 30, LDL 83 ?-Will repeat lipid panel and hepatic function. ?-Recommend to follow a heart healthy diet. ?-Will continue to monitor. ?  ?  ? Relevant Medications  ? metFORMIN (GLUCOPHAGE) 500 MG tablet  ? Other Relevant Orders  ? Comp Met (CMET)  ? CBC w/Diff  ? Lipid Profile  ?  ? Other  ? Insomnia  ?  -Stable. ?-Continue current medication  regimen. ?-Will continue to monitor. ?  ?  ? ?Other Visit Diagnoses   ? ? Need for shingles vaccine      ? Relevant Orders  ? Varicella-zoster vaccine IM (Completed)  ? ?  ? ? ?Return in about 3 months (around 04/22/2022) for DM- inc dose, HTN, HLD.  ?   ? ? ? ?Lorrene Reid, PA-C  ?[facility] ?[phone] (phone) ?[phone] (fax) ? ?[facility] ?

## 2022-01-21 ENCOUNTER — Ambulatory Visit (INDEPENDENT_AMBULATORY_CARE_PROVIDER_SITE_OTHER): Payer: Managed Care, Other (non HMO) | Admitting: Physician Assistant

## 2022-01-21 ENCOUNTER — Encounter: Payer: Self-pay | Admitting: Physician Assistant

## 2022-01-21 VITALS — BP 99/64 | HR 66 | Temp 97.9°F | Ht 65.0 in | Wt 151.0 lb

## 2022-01-21 DIAGNOSIS — Z23 Encounter for immunization: Secondary | ICD-10-CM

## 2022-01-21 DIAGNOSIS — E1169 Type 2 diabetes mellitus with other specified complication: Secondary | ICD-10-CM | POA: Diagnosis not present

## 2022-01-21 DIAGNOSIS — E785 Hyperlipidemia, unspecified: Secondary | ICD-10-CM | POA: Diagnosis not present

## 2022-01-21 DIAGNOSIS — G4700 Insomnia, unspecified: Secondary | ICD-10-CM

## 2022-01-21 LAB — POCT GLYCOSYLATED HEMOGLOBIN (HGB A1C): Hemoglobin A1C: 7.2 % — AB (ref 4.0–5.6)

## 2022-01-21 MED ORDER — METFORMIN HCL 500 MG PO TABS: 500.0000 mg | ORAL_TABLET | Freq: Two times a day (BID) | ORAL | 1 refills | Status: DC

## 2022-01-21 NOTE — Assessment & Plan Note (Signed)
-  Stable. -Continue current medication regimen.  -Will continue to monitor. 

## 2022-01-21 NOTE — Assessment & Plan Note (Signed)
-  Last lipid panel: HDL 30, LDL 83 ?-Will repeat lipid panel and hepatic function. ?-Recommend to follow a heart healthy diet. ?-Will continue to monitor. ?

## 2022-01-21 NOTE — Patient Instructions (Signed)

## 2022-01-21 NOTE — Assessment & Plan Note (Addendum)
-  A1c unchanged at 7.2, goal <7.0. Discussed with patient adjusting metformin and is agreeable to increase to 500 mg BID. Discussed diabetic diet and recommend to resume exercise regimen when able to. Will reassess A1c and medication therapy in 3 months. ?

## 2022-01-22 LAB — COMPREHENSIVE METABOLIC PANEL
ALT: 29 IU/L (ref 0–44)
AST: 22 IU/L (ref 0–40)
Albumin/Globulin Ratio: 2.4 — ABNORMAL HIGH (ref 1.2–2.2)
Albumin: 5.1 g/dL — ABNORMAL HIGH (ref 3.8–4.9)
Alkaline Phosphatase: 47 IU/L (ref 44–121)
BUN/Creatinine Ratio: 13 (ref 9–20)
BUN: 11 mg/dL (ref 6–24)
Bilirubin Total: 0.8 mg/dL (ref 0.0–1.2)
CO2: 27 mmol/L (ref 20–29)
Calcium: 9.8 mg/dL (ref 8.7–10.2)
Chloride: 102 mmol/L (ref 96–106)
Creatinine, Ser: 0.86 mg/dL (ref 0.76–1.27)
Globulin, Total: 2.1 g/dL (ref 1.5–4.5)
Glucose: 137 mg/dL — ABNORMAL HIGH (ref 70–99)
Potassium: 4.5 mmol/L (ref 3.5–5.2)
Sodium: 140 mmol/L (ref 134–144)
Total Protein: 7.2 g/dL (ref 6.0–8.5)
eGFR: 102 mL/min/{1.73_m2} (ref >59)

## 2022-01-22 LAB — LIPID PANEL
Chol/HDL Ratio: 4.8 ratio (ref 0.0–5.0)
Cholesterol, Total: 164 mg/dL (ref 100–199)
HDL: 34 mg/dL — ABNORMAL LOW (ref >39)
LDL Chol Calc (NIH): 106 mg/dL — ABNORMAL HIGH (ref 0–99)
Triglycerides: 132 mg/dL (ref 0–149)
VLDL Cholesterol Cal: 24 mg/dL (ref 5–40)

## 2022-01-22 LAB — CBC WITH DIFFERENTIAL/PLATELET
Basophils Absolute: 0 10*3/uL (ref 0.0–0.2)
Basos: 0 %
EOS (ABSOLUTE): 0.3 10*3/uL (ref 0.0–0.4)
Eos: 4 %
Hematocrit: 39.9 % (ref 37.5–51.0)
Hemoglobin: 13.5 g/dL (ref 13.0–17.7)
Immature Grans (Abs): 0 10*3/uL (ref 0.0–0.1)
Immature Granulocytes: 1 %
Lymphocytes Absolute: 2.3 10*3/uL (ref 0.7–3.1)
Lymphs: 36 %
MCH: 29.5 pg (ref 26.6–33.0)
MCHC: 33.8 g/dL (ref 31.5–35.7)
MCV: 87 fL (ref 79–97)
Monocytes Absolute: 0.4 10*3/uL (ref 0.1–0.9)
Monocytes: 6 %
Neutrophils Absolute: 3.3 10*3/uL (ref 1.4–7.0)
Neutrophils: 53 %
Platelets: 259 10*3/uL (ref 150–450)
RBC: 4.57 x10E6/uL (ref 4.14–5.80)
RDW: 12.7 % (ref 11.6–15.4)
WBC: 6.3 10*3/uL (ref 3.4–10.8)

## 2022-03-12 ENCOUNTER — Other Ambulatory Visit: Payer: Self-pay | Admitting: Physician Assistant

## 2022-03-12 DIAGNOSIS — E1169 Type 2 diabetes mellitus with other specified complication: Secondary | ICD-10-CM

## 2022-03-12 DIAGNOSIS — E786 Lipoprotein deficiency: Secondary | ICD-10-CM

## 2022-03-12 DIAGNOSIS — E781 Pure hyperglyceridemia: Secondary | ICD-10-CM

## 2022-03-13 ENCOUNTER — Other Ambulatory Visit: Payer: Self-pay | Admitting: Nurse Practitioner

## 2022-03-13 DIAGNOSIS — G4700 Insomnia, unspecified: Secondary | ICD-10-CM

## 2022-03-16 NOTE — Telephone Encounter (Signed)
Filled for 30 days. Patient will need to have follow up in order to have further refills. Please have her schedule an appointment.  Thanks so much.   -HB

## 2022-03-21 ENCOUNTER — Telehealth: Payer: Self-pay | Admitting: Physician Assistant

## 2022-03-21 DIAGNOSIS — E1169 Type 2 diabetes mellitus with other specified complication: Secondary | ICD-10-CM

## 2022-03-21 DIAGNOSIS — E786 Lipoprotein deficiency: Secondary | ICD-10-CM

## 2022-03-21 DIAGNOSIS — E781 Pure hyperglyceridemia: Secondary | ICD-10-CM

## 2022-03-21 MED ORDER — ATORVASTATIN CALCIUM 10 MG PO TABS: ORAL_TABLET | ORAL | 1 refills | Status: DC

## 2022-03-21 NOTE — Telephone Encounter (Signed)
Patient requesting refill of Atorvastatin with the increased dosage. Please advise.

## 2022-04-17 ENCOUNTER — Other Ambulatory Visit: Payer: Self-pay | Admitting: Physician Assistant

## 2022-04-17 DIAGNOSIS — M25519 Pain in unspecified shoulder: Secondary | ICD-10-CM

## 2022-04-21 ENCOUNTER — Telehealth: Payer: Self-pay | Admitting: Physician Assistant

## 2022-04-21 ENCOUNTER — Other Ambulatory Visit: Payer: Self-pay | Admitting: Nurse Practitioner

## 2022-04-21 DIAGNOSIS — G4700 Insomnia, unspecified: Secondary | ICD-10-CM

## 2022-04-21 NOTE — Telephone Encounter (Signed)
Patient requesting refill for Ambien and is asking for a 90 day supply. Please advise.

## 2022-04-22 MED ORDER — ZOLPIDEM TARTRATE 10 MG PO TABS: ORAL_TABLET | ORAL | 0 refills | Status: DC

## 2022-04-22 NOTE — Telephone Encounter (Signed)
Patient aware.

## 2022-04-22 NOTE — Telephone Encounter (Signed)
Please advise patient unable to provide 90 day refill of Ambien until he follows up with Maritza per last AVS. AS, CMA

## 2022-04-22 NOTE — Telephone Encounter (Signed)
He is able to get a 30 day supply this time but will need an appointment for further refills. AS< CMA

## 2022-04-22 NOTE — Telephone Encounter (Signed)
Is he able to get a 30 day refill on the Ambien or does he need a follow up all together?

## 2022-04-28 ENCOUNTER — Other Ambulatory Visit: Payer: Self-pay | Admitting: Nurse Practitioner

## 2022-05-28 ENCOUNTER — Ambulatory Visit: Payer: Managed Care, Other (non HMO) | Admitting: Physician Assistant

## 2022-05-28 ENCOUNTER — Other Ambulatory Visit: Payer: Self-pay | Admitting: Physician Assistant

## 2022-05-28 DIAGNOSIS — G4700 Insomnia, unspecified: Secondary | ICD-10-CM

## 2022-05-28 DIAGNOSIS — E1169 Type 2 diabetes mellitus with other specified complication: Secondary | ICD-10-CM

## 2022-05-28 DIAGNOSIS — E781 Pure hyperglyceridemia: Secondary | ICD-10-CM

## 2022-05-28 DIAGNOSIS — E786 Lipoprotein deficiency: Secondary | ICD-10-CM

## 2022-05-29 ENCOUNTER — Encounter: Payer: Self-pay | Admitting: Physician Assistant

## 2022-05-29 ENCOUNTER — Ambulatory Visit (INDEPENDENT_AMBULATORY_CARE_PROVIDER_SITE_OTHER): Payer: Managed Care, Other (non HMO) | Admitting: Physician Assistant

## 2022-05-29 VITALS — Ht 65.0 in | Wt 151.0 lb

## 2022-05-29 DIAGNOSIS — G4700 Insomnia, unspecified: Secondary | ICD-10-CM

## 2022-05-29 DIAGNOSIS — G894 Chronic pain syndrome: Secondary | ICD-10-CM | POA: Diagnosis not present

## 2022-05-29 DIAGNOSIS — E1169 Type 2 diabetes mellitus with other specified complication: Secondary | ICD-10-CM | POA: Diagnosis not present

## 2022-05-29 DIAGNOSIS — M4727 Other spondylosis with radiculopathy, lumbosacral region: Secondary | ICD-10-CM

## 2022-05-29 MED ORDER — ZOLPIDEM TARTRATE 10 MG PO TABS: ORAL_TABLET | ORAL | 0 refills | Status: DC

## 2022-05-29 NOTE — Progress Notes (Signed)
Telehealth office visit note for Lorrene Reid, PA-C- at Primary Care at [facility]   I connected with current patient today by telephone and verified that I am speaking with the correct person    Location of the patient: Hospital building  Location of the provider: Home office - This visit type was conducted due to national recommendations for restrictions regarding the COVID-19 Pandemic (e.g. social distancing) in an effort to limit this patient's exposure and mitigate transmission in our community.    - No physical exam could be performed with this format, beyond that communicated to [place] by the patient/ family members as noted.   - Additionally my office staff/ schedulers were to discuss with the patient that there may be a monetary charge related to this service, depending on their medical insurance.  My understanding is that patient understood and consented to proceed.     _________________________________________________________________________________   History of Present Illness: Patient calls in for medication refill for insomnia. Patient reports takes Ambien 10 mg every night to help with sleep especially when his neck is stiff and bothersome. Continues with Duloxetine 30 mg which helps make his chronic joint pain tolerable. Reports medication compliance with Metformin and Atorvastatin. No increased thirst or urination, no chest pain, palpitations or shortness of breath.          05/29/2022    9:13 AM 01/21/2022    9:00 AM 09/20/2021   10:16 AM 09/04/2021    2:06 PM  GAD 7 : Generalized Anxiety Score  Nervous, Anxious, on Edge 0 0 0 0  Control/stop worrying 0 0 0 0  Worry too much - different things 0 0 0 0  Trouble relaxing 0 0 0 0  Restless 0 0 0 0  Easily annoyed or irritable 0 0 0 0  Afraid - awful might happen 0 0 0 0  Total GAD 7 Score 0 0 0 0  Anxiety Difficulty Not difficult at all Not difficult at all  Not difficult at all       05/29/2022    9:12 AM  01/21/2022    9:00 AM 09/20/2021   10:16 AM 09/04/2021    2:06 PM 05/20/2021   10:07 AM  Depression screen PHQ 2/9  Decreased Interest 0 0 0 0 0  Down, Depressed, Hopeless 0 0 0 0 0  PHQ - 2 Score 0 0 0 0 0  Altered sleeping 0 0 0 0 0  Tired, decreased energy 0 0 0 0 0  Change in appetite 0 0 0 0 0  Feeling bad or failure about yourself  0 0 0 0 0  Trouble concentrating 0 0 0 0 0  Moving slowly or fidgety/restless 0 0 0 0 0  Suicidal thoughts 0 0 0 0 0  PHQ-9 Score 0 0 0 0 0  Difficult doing work/chores Not difficult at all Not difficult at all  Not difficult at all       Impression and Recommendations:     1. Type 2 diabetes mellitus with other specified complication, without long-term current use of insulin ([place])   2. Insomnia, unspecified type     Type 2 diabetes mellitus with other specified complication, without long-term current use of insulin: -Last A1c 7.2, unchanged from prior. Advised patient to schedule lab visit for fasting labs including A1c. -Continue current medication regimen. Will continue to monitor.  Insomnia, unspecified type: -Stable. Continue current medication regimen. PDMP reviewed, no aberrancies noted. Will continue  to monitor.  Chronic pain syndrome: -Pt with hx of cervical DDD, cervical stenosis, and lumbosacral spondylosis and spinal osteoarthritis. Recommend to continue duloxetine 30 mg daily. Will continue to monitor.   - As part of my medical decision making, I reviewed the following data within the [place] History obtained from pt /family, CMA notes reviewed and incorporated if applicable, Labs reviewed, Radiograph/ tests reviewed if applicable and OV notes from prior OV's with me, as well as any other specialists she/he has seen since seeing me last, were all reviewed and used in my medical decision making process today.    - Additionally, when appropriate, discussion had with patient regarding our treatment plan, and their  biases/concerns about that plan were used in my medical decision making today.    - The patient agreed with the plan and demonstrated an understanding of the instructions.   No barriers to understanding were identified.     - The patient was advised to call back or seek an in-person evaluation if the symptoms worsen or if the condition fails to improve as anticipated.   Return in about 4 months (around 09/28/2022) for CPE; lab visit for FBW in 1-3 weeks.    No orders of the defined types were placed in this encounter.   Meds ordered this encounter  Medications   zolpidem (AMBIEN) 10 MG tablet    Sig: TAKE 1 TABLET BY MOUTH EVERY DAY AT BEDTIME AS NEEDED    Dispense:  90 tablet    Refill:  0    Not to exceed 4 additional fills before 06/17/2022    Order Specific Question:   Supervising Provider    Answer:   Beatrice Lecher D [[id number]]    Medications Discontinued During This Encounter  Medication Reason   zolpidem (AMBIEN) 10 MG tablet Reorder       Time spent on telephone encounter care was 5 minutes.      The [place] was signed into law in 2016 which includes the topic of electronic health records.  This provides immediate access to information in MyChart.  This includes consultation notes, operative notes, office notes, lab results and pathology reports.  If you have any questions about what you read please let us know at your next visit or call us at the office.  We are right here with you.   __________________________________________________________________________________     Patient Care Team    Relationship Specialty Notifications Start End  Lorrene Reid, [place] PCP - General   02/12/20   Carlyle Dolly, MD (Inactive) Resident Family Medicine All results, Admissions 10/03/15   [name], Kevan Ny, MD Consulting Physician Neurosurgery  11/05/16   Marlaine Hind, MD Consulting Physician Physical Medicine and Rehabilitation  11/05/16     Comment: pain Doc  [place], [name], [place]  Optometry  05/03/18      -Vitals obtained; medications/ allergies reconciled;  personal medical, social, Sx etc.histories were updated by CMA, reviewed by me and are reflected in chart   Patient Active Problem List   Diagnosis Date Noted   Elevated blood-pressure reading, without diagnosis of hypertension 11/20/2020   Other long term (current) drug therapy 09/27/2019   High triglycerides 03/22/2019   Dizziness 08/12/2018   Orthostatic hypotension 08/12/2018   Dehydration, mild 08/12/2018   Vitamin D deficiency 01/01/2018   Hyperlipidemia associated with type 2 diabetes mellitus ([place]) 01/01/2018   Low testosterone level in male 01/01/2018   Muscle pain 08/13/2017   Lumbosacral spondylosis with  radiculopathy 11/27/2016   Low HDL (under 40) 11/05/2016   Diabetes mellitus ([place]) 11/05/2016   Status post colonoscopy 2008 11/05/2016   Status post lumbar spinal fusion 11/05/2016   Not currently working due to disabled status 11/05/2016   Status post cervical spinal fusion 11/05/2016   Long-term use of high-risk medication 11/05/2016   Muscle spasms of neck, upper back & lower back 11/05/2016   Osteoarthritis of spine with radiculopathy, cervical region 09/01/2016   Opioid dependence ([place]) 03/18/2016   DDD (degenerative disc disease), lumbar 09/28/2015   Insomnia 09/28/2015   Anxiety 09/27/2015   Lumbar post-laminectomy syndrome 09/27/2015   DDD (degenerative disc disease), cervical 09/05/2015   Chronic pain associated with significant psychosocial dysfunction 09/05/2015   Chronic pain syndrome 09/05/2015   Cervical stenosis of spine 09/05/2015   Numbness of upper limb 08/23/2015   Cervical pain (neck) 08/07/2015     Current Meds  Medication Sig   atorvastatin (LIPITOR) 10 MG tablet TAKE 1 TABLET BY MOUTH EVERY DAY AT BEDTIME   betamethasone dipropionate 0.05 % cream APPLY TO AFFECTED AREA TWICE A DAY   DULoxetine (CYMBALTA) 30 MG capsule  Take 1 capsule (30 mg total) by mouth 2 (two) times daily.   metFORMIN (GLUCOPHAGE) 500 MG tablet Take 1 tablet (500 mg total) by mouth 2 (two) times daily with a meal.   Multiple Vitamin (MULTIVITAMIN) tablet Take 1 tablet by mouth daily.   omega-3 acid ethyl esters (LOVAZA) 1 g capsule TAKE 2 CAPSULES BY MOUTH TWICE A DAY   TURMERIC PO Take 1 tablet by mouth daily.   [DISCONTINUED] zolpidem (AMBIEN) 10 MG tablet TAKE 1 TABLET BY MOUTH EVERY DAY AT BEDTIME AS NEEDED     Allergies:  Allergies  Allergen Reactions   Trazodone And Nefazodone Other (See Comments)    Passes out     ROS:  See above HPI for pertinent positives and negatives   Objective:   Height '5\' 5"'$  (1.651 m), weight 151 lb (68.5 kg).  (if some vitals are omitted, this means that patient was UNABLE to obtain them. ) General: A & O * 3; sounds in no acute distress Respiratory: speaking in full sentences, no conversational dyspnea Psych: insight appears good, mood- appears full

## 2022-07-18 ENCOUNTER — Other Ambulatory Visit: Payer: Self-pay | Admitting: Nurse Practitioner

## 2022-07-18 DIAGNOSIS — E1169 Type 2 diabetes mellitus with other specified complication: Secondary | ICD-10-CM

## 2022-07-31 ENCOUNTER — Telehealth: Payer: Self-pay

## 2022-07-31 DIAGNOSIS — U071 COVID-19: Secondary | ICD-10-CM

## 2022-07-31 MED ORDER — MOLNUPIRAVIR EUA 200MG CAPSULE: 4.0000 | ORAL_CAPSULE | Freq: Two times a day (BID) | ORAL | 0 refills | Status: AC

## 2022-07-31 NOTE — Telephone Encounter (Signed)
Patient called to see if he could medication for cv19, patient took home test yesterday, please advise,thanks!

## 2022-07-31 NOTE — Telephone Encounter (Signed)
Spoke with patient, expressed understanding of medication being sent to pharmacy.

## 2022-08-03 ENCOUNTER — Other Ambulatory Visit: Payer: Self-pay | Admitting: Physician Assistant

## 2022-08-03 DIAGNOSIS — E786 Lipoprotein deficiency: Secondary | ICD-10-CM

## 2022-08-03 DIAGNOSIS — E1169 Type 2 diabetes mellitus with other specified complication: Secondary | ICD-10-CM

## 2022-08-03 DIAGNOSIS — E781 Pure hyperglyceridemia: Secondary | ICD-10-CM

## 2022-08-10 ENCOUNTER — Other Ambulatory Visit: Payer: Self-pay | Admitting: Physician Assistant

## 2022-08-24 ENCOUNTER — Other Ambulatory Visit: Payer: Self-pay | Admitting: Physician Assistant

## 2022-08-24 DIAGNOSIS — E1169 Type 2 diabetes mellitus with other specified complication: Secondary | ICD-10-CM

## 2022-08-25 ENCOUNTER — Other Ambulatory Visit: Payer: Self-pay | Admitting: Physician Assistant

## 2022-08-25 DIAGNOSIS — G4700 Insomnia, unspecified: Secondary | ICD-10-CM

## 2022-11-20 ENCOUNTER — Other Ambulatory Visit: Payer: Self-pay

## 2022-11-20 DIAGNOSIS — E1169 Type 2 diabetes mellitus with other specified complication: Secondary | ICD-10-CM

## 2022-11-20 MED ORDER — METFORMIN HCL 500 MG PO TABS: 500.0000 mg | ORAL_TABLET | Freq: Two times a day (BID) | ORAL | 0 refills | Status: DC

## 2022-11-24 ENCOUNTER — Other Ambulatory Visit: Payer: Self-pay

## 2022-11-24 ENCOUNTER — Other Ambulatory Visit: Payer: Self-pay | Admitting: Family Medicine

## 2022-11-24 DIAGNOSIS — G4700 Insomnia, unspecified: Secondary | ICD-10-CM

## 2022-11-24 MED ORDER — ZOLPIDEM TARTRATE 10 MG PO TABS: ORAL_TABLET | ORAL | 0 refills | Status: DC

## 2022-11-24 NOTE — Telephone Encounter (Signed)
Pt is requesting a refill on:  zolpidem (AMBIEN) 10 MG tablet    Pharmacy: [id number] [address]    LOV 05/29/22 ROV 12/25/22

## 2022-11-25 NOTE — Telephone Encounter (Signed)
LVM that Rx was sent to the requested pharmacy.

## 2022-12-15 ENCOUNTER — Telehealth: Payer: Self-pay | Admitting: *Deleted

## 2022-12-15 NOTE — Telephone Encounter (Signed)
Pt calling stating that Christella Scheuermann is going to be sending a form to be completed for patients duloxetine.  He said that once completed to please mark it as URGENT because otherwise it will take them 8 days to process this. He said that insurance only allows 1 a day so a PA should be completed for it to be 2 a day. Confirmed with pharmacy pt picked up 90 tablets on 11/14/22 and still has 90 available. Form placed in clinical box to be reviewed and given to provider if needed.   Original fill 08/09/2022 for 180 tablets  Refill 11/14/2022 for 90 tablets  90 tablets still at pharmacy.

## 2022-12-16 NOTE — Telephone Encounter (Signed)
Form has been filled out and given [place], PA for review.

## 2022-12-18 ENCOUNTER — Other Ambulatory Visit: Payer: Self-pay

## 2022-12-18 DIAGNOSIS — Z0000 Encounter for general adult medical examination without abnormal findings: Secondary | ICD-10-CM

## 2022-12-18 DIAGNOSIS — Z130 Encounter for screening for diseases of the blood and blood-forming organs and certain disorders involving the immune mechanism: Secondary | ICD-10-CM

## 2022-12-18 DIAGNOSIS — R7309 Other abnormal glucose: Secondary | ICD-10-CM

## 2022-12-18 NOTE — Telephone Encounter (Addendum)
Pt is wanting to know if the forms have been faxed as of yet.  Pt is asking for a call back.

## 2022-12-18 NOTE — Telephone Encounter (Signed)
Called patient to inform inform him that PA was faxed on 12/16/22. I received a letter stating that Duloxetine was approved from 11/16/22 - 12/16/23. Unable to reach patient at phone number provided.

## 2022-12-19 ENCOUNTER — Other Ambulatory Visit: Payer: Managed Care, Other (non HMO)

## 2022-12-22 ENCOUNTER — Other Ambulatory Visit: Payer: Managed Care, Other (non HMO)

## 2022-12-22 DIAGNOSIS — Z130 Encounter for screening for diseases of the blood and blood-forming organs and certain disorders involving the immune mechanism: Secondary | ICD-10-CM

## 2022-12-22 DIAGNOSIS — R7309 Other abnormal glucose: Secondary | ICD-10-CM

## 2022-12-22 DIAGNOSIS — Z0000 Encounter for general adult medical examination without abnormal findings: Secondary | ICD-10-CM

## 2022-12-24 ENCOUNTER — Telehealth: Payer: Self-pay | Admitting: *Deleted

## 2022-12-24 LAB — CBC WITH DIFFERENTIAL/PLATELET
Basophils Absolute: 0 10*3/uL (ref 0.0–0.2)
Basos: 0 %
EOS (ABSOLUTE): 0.3 10*3/uL (ref 0.0–0.4)
Eos: 5 %
Hematocrit: 41.2 % (ref 37.5–51.0)
Hemoglobin: 13.6 g/dL (ref 13.0–17.7)
Immature Grans (Abs): 0 10*3/uL (ref 0.0–0.1)
Immature Granulocytes: 0 %
Lymphocytes Absolute: 2.4 10*3/uL (ref 0.7–3.1)
Lymphs: 40 %
MCH: 28.8 pg (ref 26.6–33.0)
MCHC: 33 g/dL (ref 31.5–35.7)
MCV: 87 fL (ref 79–97)
Monocytes Absolute: 0.4 10*3/uL (ref 0.1–0.9)
Monocytes: 7 %
Neutrophils Absolute: 2.9 10*3/uL (ref 1.4–7.0)
Neutrophils: 48 %
Platelets: 238 10*3/uL (ref 150–450)
RBC: 4.73 x10E6/uL (ref 4.14–5.80)
RDW: 12.8 % (ref 11.6–15.4)
WBC: 6 10*3/uL (ref 3.4–10.8)

## 2022-12-24 LAB — COMPREHENSIVE METABOLIC PANEL
ALT: 25 IU/L (ref 0–44)
AST: 17 IU/L (ref 0–40)
Albumin/Globulin Ratio: 2 (ref 1.2–2.2)
Albumin: 4.5 g/dL (ref 3.8–4.9)
Alkaline Phosphatase: 49 IU/L (ref 44–121)
BUN/Creatinine Ratio: 14 (ref 9–20)
BUN: 12 mg/dL (ref 6–24)
Bilirubin Total: 0.7 mg/dL (ref 0.0–1.2)
CO2: 27 mmol/L (ref 20–29)
Calcium: 9.1 mg/dL (ref 8.7–10.2)
Chloride: 103 mmol/L (ref 96–106)
Creatinine, Ser: 0.87 mg/dL (ref 0.76–1.27)
Globulin, Total: 2.2 g/dL (ref 1.5–4.5)
Glucose: 155 mg/dL — ABNORMAL HIGH (ref 70–99)
Potassium: 4.4 mmol/L (ref 3.5–5.2)
Sodium: 142 mmol/L (ref 134–144)
Total Protein: 6.7 g/dL (ref 6.0–8.5)
eGFR: 101 mL/min/{1.73_m2} (ref >59)

## 2022-12-24 LAB — LIPID PANEL
Chol/HDL Ratio: 3.4 ratio (ref 0.0–5.0)
Cholesterol, Total: 112 mg/dL (ref 100–199)
HDL: 33 mg/dL — ABNORMAL LOW (ref >39)
LDL Chol Calc (NIH): 58 mg/dL (ref 0–99)
Triglycerides: 112 mg/dL (ref 0–149)
VLDL Cholesterol Cal: 21 mg/dL (ref 5–40)

## 2022-12-24 LAB — HEMOGLOBIN A1C
Est. average glucose Bld gHb Est-mCnc: 177 mg/dL
Hgb A1c MFr Bld: 7.8 % — ABNORMAL HIGH (ref 4.8–5.6)

## 2022-12-24 LAB — TSH: TSH: 2.07 u[IU]/mL (ref 0.450–4.500)

## 2022-12-24 NOTE — Telephone Encounter (Signed)
Pt calling requesting a refill on his cymbalta, informed him that he has an appointment tomorrow and that he should be able to have his refills in that appointment. .memd

## 2022-12-24 NOTE — Telephone Encounter (Signed)
Another encounter was opened. Closing this one.

## 2022-12-25 ENCOUNTER — Ambulatory Visit: Payer: Managed Care, Other (non HMO) | Admitting: Family Medicine

## 2022-12-25 ENCOUNTER — Encounter: Payer: Self-pay | Admitting: Family Medicine

## 2022-12-25 VITALS — BP 113/76 | HR 77 | Resp 18 | Ht 65.0 in | Wt 141.0 lb

## 2022-12-25 DIAGNOSIS — E785 Hyperlipidemia, unspecified: Secondary | ICD-10-CM | POA: Diagnosis not present

## 2022-12-25 DIAGNOSIS — Z23 Encounter for immunization: Secondary | ICD-10-CM

## 2022-12-25 DIAGNOSIS — G894 Chronic pain syndrome: Secondary | ICD-10-CM

## 2022-12-25 DIAGNOSIS — E1169 Type 2 diabetes mellitus with other specified complication: Secondary | ICD-10-CM

## 2022-12-25 DIAGNOSIS — E786 Lipoprotein deficiency: Secondary | ICD-10-CM

## 2022-12-25 MED ORDER — FENOFIBRATE 48 MG PO TABS: 48.0000 mg | ORAL_TABLET | Freq: Every day | ORAL | 1 refills | Status: DC

## 2022-12-25 MED ORDER — DULOXETINE HCL 30 MG PO CPEP: 30.0000 mg | ORAL_CAPSULE | Freq: Two times a day (BID) | ORAL | 1 refills | Status: DC

## 2022-12-25 NOTE — Progress Notes (Signed)
Complete physical exam  Patient: Richard Noble   DOB: 1966-07-10   57 y.o. Male  MRN: [id number]  Subjective:    Chief Complaint  Patient presents with   Annual Exam    Richard Noble is a 57 y.o. male who presents today for a complete physical exam. He reports consuming a general diet.  He is planning to get back into a more regular routine of getting outside and exercising now that the weather is nicer  He generally feels well. He reports sleeping well as long as he takes his Ambien. He does not have additional problems to discuss today.    Most recent fall risk assessment:    12/25/2022    1:50 PM  Fall Risk   Falls in the past year? 0  Number falls in past yr: 0  Injury with Fall? 0  Risk for fall due to : No Fall Risks     Most recent depression screenings:    12/25/2022    1:49 PM 05/29/2022    9:12 AM  PHQ 2/9 Scores  PHQ - 2 Score 0 0  PHQ- 9 Score 0 0    Patient Active Problem List   Diagnosis Date Noted   Elevated blood-pressure reading, without diagnosis of hypertension 11/20/2020   High triglycerides 03/22/2019   Orthostatic hypotension 08/12/2018   Vitamin D deficiency 01/01/2018   Hyperlipidemia associated with type 2 diabetes mellitus ([place]) 01/01/2018   Low testosterone level in male 01/01/2018   Lumbosacral spondylosis with radiculopathy 11/27/2016   Low HDL (under 40) 11/05/2016   Type 2 diabetes mellitus with other specified complication ([place]) 0000000   Long-term use of high-risk medication 11/05/2016   Muscle spasms of neck, upper back & lower back 11/05/2016   Osteoarthritis of spine with radiculopathy, cervical region 09/01/2016   Opioid dependence ([place]) 03/18/2016   DDD (degenerative disc disease), lumbar S/P lumbar spinal fusion 09/28/2015   Insomnia 09/28/2015   Anxiety 09/27/2015   Lumbar post-laminectomy syndrome 09/27/2015   DDD (degenerative disc disease), cervical S/P cervical spinal fusion 09/05/2015   Chronic pain associated with  significant psychosocial dysfunction 09/05/2015   Chronic pain syndrome 09/05/2015   Cervical stenosis of spine 09/05/2015   Cervical pain (neck) 08/07/2015    Past Surgical History:  Procedure Laterality Date   ANTERIOR CERVICAL DECOMP/DISCECTOMY FUSION N/A 09/01/2016   Procedure: Cervical five-six, Cervical six-seven ANTERIOR CERVICAL DECOMPRESSION/DISCECTOMY FUSION;  Surgeon: Kevan Ny [name], MD;  Location: [place];  Service: Neurosurgery;  Laterality: N/A;   APPENDECTOMY  1984   BACK SURGERY  2012    L5-S1 fusion   BACK SURGERY  2016   C6-7 Decompression. [facility])   COLONOSCOPY  2008   [place]- normal per pt    Social History   Tobacco Use   Smoking status: Never    Passive exposure: Never   Smokeless tobacco: Never  Vaping Use   Vaping Use: Never used  Substance Use Topics   Alcohol use: Yes    Alcohol/week: 0.0 standard drinks of alcohol    Comment: weekly   Drug use: No   Family History  Problem Relation Age of Onset   Diabetes Mother    Diabetes Brother    Diabetes Sister    Diabetes Brother    Colon cancer Neg Hx    Colon polyps Neg Hx    Esophageal cancer Neg Hx    Rectal cancer Neg Hx    Stomach cancer Neg Hx  Allergies  Allergen Reactions   Trazodone And Nefazodone Other (See Comments)    Passes out     Patient Care Team: Velva Harman, PA as PCP - General (Family Medicine) Carlyle Dolly, MD (Inactive) as Resident (Family Medicine) [name], Kevan Ny, MD as Consulting Physician (Neurosurgery) Marlaine Hind, MD as Consulting Physician (Physical Medicine and Rehabilitation) [place], [name], OD [facility])   Outpatient Medications Prior to Visit  Medication Sig   atorvastatin (LIPITOR) 10 MG tablet TAKE 1 TABLET BY MOUTH EVERYDAY AT BEDTIME   betamethasone dipropionate 0.05 % cream APPLY TO AFFECTED AREA TWICE A DAY   metFORMIN (GLUCOPHAGE) 500 MG tablet Take 1 tablet (500 mg total) by mouth 2 (two) times daily  with a meal.   Multiple Vitamin (MULTIVITAMIN) tablet Take 1 tablet by mouth daily.   omega-3 acid ethyl esters (LOVAZA) 1 g capsule TAKE 2 CAPSULES BY MOUTH TWICE A DAY   TURMERIC PO Take 1 tablet by mouth daily.   zolpidem (AMBIEN) 10 MG tablet Take 1 tablet by mouth every day at bedtime as needed   [DISCONTINUED] DULoxetine (CYMBALTA) 30 MG capsule TAKE 1 CAPSULE BY MOUTH 2 TIMES DAILY.   No facility-administered medications prior to visit.    Review of Systems  Constitutional:  Negative for chills, fever and malaise/fatigue.  HENT:  Negative for congestion and hearing loss.   Eyes:  Negative for blurred vision and double vision.  Respiratory:  Negative for cough and shortness of breath.   Cardiovascular:  Negative for chest pain, palpitations and leg swelling.  Gastrointestinal:  Negative for abdominal pain, constipation, diarrhea and heartburn.  Genitourinary:  Negative for frequency and urgency.  Musculoskeletal:  Negative for myalgias and neck pain.  Neurological:  Negative for headaches.  Endo/Heme/Allergies:  Negative for polydipsia.  Psychiatric/Behavioral:  Negative for depression. The patient is not nervous/anxious.        Objective:    BP 113/76 (BP Location: Left Arm, Patient Position: Sitting, Cuff Size: Normal)   Pulse 77   Resp 18   Ht '5\' 5"'$  (1.651 m)   Wt 141 lb (64 kg)   SpO2 96%   BMI 23.46 kg/m    Physical Exam Constitutional:      General: He is not in acute distress.    Appearance: Normal appearance.  HENT:     Head: Normocephalic and atraumatic.     Right Ear: Tympanic membrane and ear canal normal.     Left Ear: Tympanic membrane and ear canal normal.     Nose: Nose normal.     Mouth/Throat:     Mouth: Mucous membranes are moist.     Pharynx: Oropharynx is clear.  Eyes:     Extraocular Movements: Extraocular movements intact.     Conjunctiva/sclera: Conjunctivae normal.     Pupils: Pupils are equal, round, and reactive to light.   Cardiovascular:     Rate and Rhythm: Normal rate and regular rhythm.     Pulses: Normal pulses.     Heart sounds: Normal heart sounds.  Pulmonary:     Effort: Pulmonary effort is normal.     Breath sounds: Normal breath sounds.  Abdominal:     General: Bowel sounds are normal.     Palpations: Abdomen is soft.  Musculoskeletal:        General: Normal range of motion.     Cervical back: Normal range of motion and neck supple. No tenderness.  Lymphadenopathy:     Cervical: No cervical adenopathy.  Skin:  General: Skin is warm and dry.  Neurological:     Mental Status: He is alert and oriented to person, place, and time.     Cranial Nerves: No cranial nerve deficit.     Deep Tendon Reflexes: Reflexes normal.  Psychiatric:        Mood and Affect: Mood normal.       Assessment & Plan:    Routine Health Maintenance and Physical Exam  Immunization History  Administered Date(s) Administered   Influenza Inj Mdck Quad Pf 07/11/2019   Influenza,inj,Quad PF,6+ Mos 09/28/2015, 08/12/2018   Influenza-Unspecified 08/07/2016, 08/23/2021   PFIZER(Purple Top)SARS-COV-2 Vaccination 01/02/2020, 01/30/2020, 07/28/2020   PNEUMOCOCCAL CONJUGATE-20 09/20/2021   Pfizer Covid-19 Vaccine Bivalent Booster 70yr & up 08/23/2021   Pneumococcal Conjugate-13 04/19/2015   Tdap 11/03/2018   Zoster Recombinat (Shingrix) 09/20/2021, 01/21/2022    Health Maintenance  Topic Date Due   HIV Screening  Never done   Hepatitis C Screening  Never done   OPHTHALMOLOGY EXAM  04/08/2019   INFLUENZA VACCINE  05/13/2022   Diabetic kidney evaluation - Urine ACR  05/20/2022   COVID-19 Vaccine (5 - 2023-24 season) 06/13/2022   FOOT EXAM  01/22/2023   HEMOGLOBIN A1C  06/24/2023   Diabetic kidney evaluation - eGFR measurement  12/22/2023   COLONOSCOPY (Pts 45-422yrInsurance coverage will need to be confirmed)  11/08/2027   DTaP/Tdap/Td (2 - Td or Tdap) 11/03/2028   Zoster Vaccines- Shingrix  Completed   HPV  VACCINES  Aged Out    Discussed health benefits of physical activity, and encouraged him to engage in regular exercise appropriate for his age and condition.  Type 2 diabetes mellitus with other specified complication, without long-term current use of insulin (HCC) Assessment & Plan: A1c increased to 7.8, at goal <7.0.  Patient states that if he is being honest, he has not been consistent in taking his metformin twice a day every day, maybe half the time.  He would like to give 3 months of being very consistent with taking it twice a day before adjusting medication therapy, but if still uncontrolled then we discussed possibly adding something like [place] Encouraged to limit carbohydrates and get into regular exercise.  We will check A1c again at his follow-up appointment.  Orders: -     Microalbumin / creatinine urine ratio  Hyperlipidemia associated with type 2 diabetes mellitus (HCC) Assessment & Plan: Last lipid panel HDL 33, LDL 58.  Hepatic function within normal limits.  Continue atorvastatin 10 mg and heart healthy diet.  Will continue to monitor.   Low HDL (under 40) Assessment & Plan: HDL is persistently low even with omega-3 fatty acid supplement.  Discussed starting fenofibrate to raise HDL levels, patient is agreeable to this plan.  Discussed possible side effects and provided patient printout with additional information.  Will recheck lipids at next appointment.  Orders: -     Fenofibrate; Take 1 tablet (48 mg total) by mouth daily.  Dispense: 90 tablet; Refill: 1  Need for influenza vaccination -     Flu Vaccine QUAD 46m17mo (Fluarix, Fluzone & Alfiuria Quad PF)  Chronic pain associated with significant psychosocial dysfunction -     DULoxetine HCl; Take 1 capsule (30 mg total) by mouth 2 (two) times daily.  Dispense: 180 capsule; Refill: 1  Reviewed recent lab work which was mostly within normal limits or stable from last check, other than low HDL and  increased A1c.  Return in about 3 months (around 03/27/2023)  for follow-up.     Velva Harman, PA

## 2022-12-25 NOTE — Assessment & Plan Note (Signed)
Last lipid panel HDL 33, LDL 58.  Hepatic function within normal limits.  Continue atorvastatin 10 mg and heart healthy diet.  Will continue to monitor.

## 2022-12-25 NOTE — Assessment & Plan Note (Addendum)
A1c increased to 7.8, at goal <7.0.  Patient states that if he is being honest, he has not been consistent in taking his metformin twice a day every day, maybe half the time.  He would like to give 3 months of being very consistent with taking it twice a day before adjusting medication therapy, but if still uncontrolled then we discussed possibly adding something like [place] or [place].  Encouraged to limit carbohydrates and get into regular exercise.  We will check A1c again at his follow-up appointment.

## 2022-12-25 NOTE — Assessment & Plan Note (Signed)
HDL is persistently low even with omega-3 fatty acid supplement.  Discussed starting fenofibrate to raise HDL levels, patient is agreeable to this plan.  Discussed possible side effects and provided patient printout with additional information.  Will recheck lipids at next appointment.

## 2022-12-27 LAB — MICROALBUMIN / CREATININE URINE RATIO
Creatinine, Urine: 418.6 mg/dL
Microalb/Creat Ratio: 13 mg/g creat (ref 0–29)
Microalbumin, Urine: 53.5 ug/mL

## 2023-02-22 ENCOUNTER — Other Ambulatory Visit: Payer: Self-pay | Admitting: Family Medicine

## 2023-02-22 DIAGNOSIS — G4700 Insomnia, unspecified: Secondary | ICD-10-CM

## 2023-03-23 ENCOUNTER — Other Ambulatory Visit: Payer: Self-pay

## 2023-03-23 DIAGNOSIS — E786 Lipoprotein deficiency: Secondary | ICD-10-CM

## 2023-03-23 DIAGNOSIS — E1169 Type 2 diabetes mellitus with other specified complication: Secondary | ICD-10-CM

## 2023-03-23 DIAGNOSIS — E781 Pure hyperglyceridemia: Secondary | ICD-10-CM

## 2023-03-23 MED ORDER — ATORVASTATIN CALCIUM 10 MG PO TABS
ORAL_TABLET | ORAL | 0 refills | Status: DC
Start: 2023-03-23 — End: 2023-05-22

## 2023-03-27 ENCOUNTER — Encounter: Payer: Self-pay | Admitting: Family Medicine

## 2023-03-27 ENCOUNTER — Ambulatory Visit: Payer: Managed Care, Other (non HMO) | Admitting: Family Medicine

## 2023-03-27 VITALS — BP 112/71 | HR 63 | Resp 18 | Ht 65.0 in | Wt 145.0 lb

## 2023-03-27 DIAGNOSIS — Z7984 Long term (current) use of oral hypoglycemic drugs: Secondary | ICD-10-CM

## 2023-03-27 DIAGNOSIS — E785 Hyperlipidemia, unspecified: Secondary | ICD-10-CM

## 2023-03-27 DIAGNOSIS — E1169 Type 2 diabetes mellitus with other specified complication: Secondary | ICD-10-CM

## 2023-03-27 LAB — POCT GLYCOSYLATED HEMOGLOBIN (HGB A1C): HbA1c POC (<> result, manual entry): 7.9 % (ref 4.0–5.6)

## 2023-03-27 MED ORDER — METFORMIN HCL 500 MG PO TABS
ORAL_TABLET | ORAL | 1 refills | Status: DC
Start: 2023-03-27 — End: 2023-08-21

## 2023-03-27 NOTE — Assessment & Plan Note (Signed)
Last lipid panel: LDL 58, HDL 33, triglycerides 112.  Continue atorvastatin 10 mg daily, Lovaza 1 g daily, fenofibrate 48 mg daily.  Will recheck lipid panel and CMP for medication monitoring at follow-up in 3 months.

## 2023-03-27 NOTE — Progress Notes (Signed)
Established Patient Office Visit  Subjective   Patient ID: Richard Noble, male    DOB: 1965-11-28  Age: 57 y.o. MRN: [id number]  Chief Complaint  Patient presents with   Diabetes   Hyperlipidemia    HPI Richard Noble is a 57 y.o. male presenting today for follow up of hyperlipidemia, diabetes. Hyperlipidemia: tolerating atorvastatin well with no myalgias or significant side effects.  Also taking fenofibrate and Lovaza for low HDL.   The ASCVD Risk score (Arnett DK, et al., 2019) failed to calculate for the following reasons:   The valid total cholesterol range is 130 to 320 mg/dL Diabetes: At last appointment, discussed that he was not always consistent in taking his metformin 500 mg twice daily with meals.  Goal was to be more consistent in medication regimen before reassessing.  Denies hypoglycemic events, wounds or sores that are not healing well, increased thirst or urination. Denies vision problems, eye exam due. Taking metformin without any side effects.  The past several months he has been caring for his aging mother and has not been able to take the best care of himself.  He admits that he has not been able to go to the gym as often and is not consistent with taking his morning dose of metformin.  His mother did pass about 2 weeks ago.  He states that he has been coping with it fairly well but he does miss her.  He does say that on the bright side he is looking forward to taking better care of of himself.  ROS Negative unless otherwise noted in HPI   Objective:     BP 112/71 (BP Location: Left Arm, Patient Position: Sitting, Cuff Size: Normal)   Pulse 63   Resp 18   Ht 5\' 5"  (1.651 m)   Wt 145 lb (65.8 kg)   SpO2 98%   BMI 24.13 kg/m   Physical Exam Constitutional:      General: He is not in acute distress.    Appearance: Normal appearance.  HENT:     Head: Normocephalic and atraumatic.  Cardiovascular:     Rate and Rhythm: Normal rate and regular rhythm.      Pulses: Normal pulses.     Heart sounds: Normal heart sounds. No murmur heard.    No friction rub. No gallop.  Pulmonary:     Effort: Pulmonary effort is normal. No respiratory distress.     Breath sounds: Normal breath sounds. No wheezing, rhonchi or rales.  Skin:    General: Skin is warm and dry.  Neurological:     Mental Status: He is alert and oriented to person, place, and time.  Psychiatric:        Mood and Affect: Mood normal.    Results for orders placed or performed in visit on 03/27/23  POCT HgB A1C  Result Value Ref Range   Hemoglobin A1C     HbA1c POC (<> result, manual entry) 7.9 4.0 - 5.6 %   HbA1c, POC (prediabetic range)     HbA1c, POC (controlled diabetic range)       Assessment & Plan:  Hyperlipidemia associated with type 2 diabetes mellitus (HCC) Assessment & Plan: Last lipid panel: LDL 58, HDL 33, triglycerides 112.  Continue atorvastatin 10 mg daily, Lovaza 1 g daily, fenofibrate 48 mg daily.  Will recheck lipid panel and CMP for medication monitoring at follow-up in 3 months.   Type 2 diabetes mellitus with other specified complication, without long-term current  use of insulin (HCC) Assessment & Plan: A1c increased slightly from 7.8 to 7.9.  Since he has not been consistent with the morning dose of metformin all the time, changing medication regimen to metformin 500 mg daily in the morning and 1000 mg daily in the evening.  We discussed that this way, he is getting at least 1000 mg of metformin daily even if he misses the morning dose, but it is still safe to have 1500 mg of metformin total daily if he does get both doses.  He is also planning on getting back into a gym routine now that he is not taking care of of his mother.  Will recheck A1c with fasting lab work in 3 months and reassess.  If necessary, we can either max out metformin or simplify his routine by adding something like [place] or [place].  Orders: -     POCT glycosylated hemoglobin (Hb A1C) -      metFORMIN HCl; TAKE 1 TABLET EVERY MORNING AND 2 TABLETS EVERY EVENING.  Dispense: 270 tablet; Refill: 1    Return in about 3 months (around 06/27/2023) for follow-up for HLD, DM, fasting blood work 1 week before.    Melida Quitter, PA

## 2023-03-27 NOTE — Assessment & Plan Note (Signed)
A1c increased slightly from 7.8 to 7.9.  Since he has not been consistent with the morning dose of metformin all the time, changing medication regimen to metformin 500 mg daily in the morning and 1000 mg daily in the evening.  We discussed that this way, he is getting at least 1000 mg of metformin daily even if he misses the morning dose, but it is still safe to have 1500 mg of metformin total daily if he does get both doses.  He is also planning on getting back into a gym routine now that he is not taking care of of his mother.  Will recheck A1c with fasting lab work in 3 months and reassess.  If necessary, we can either max out metformin or simplify his routine by adding something like [place] or [place].

## 2023-03-27 NOTE — Patient Instructions (Signed)
METFORMIN: TAKE 1 TABLET EVERY MORNING AND 2 TABLETS EVERY EVENING.

## 2023-05-22 ENCOUNTER — Other Ambulatory Visit: Payer: Self-pay | Admitting: Family Medicine

## 2023-05-22 DIAGNOSIS — E1169 Type 2 diabetes mellitus with other specified complication: Secondary | ICD-10-CM

## 2023-05-22 DIAGNOSIS — E786 Lipoprotein deficiency: Secondary | ICD-10-CM

## 2023-05-22 DIAGNOSIS — G4700 Insomnia, unspecified: Secondary | ICD-10-CM

## 2023-05-22 DIAGNOSIS — E781 Pure hyperglyceridemia: Secondary | ICD-10-CM

## 2023-08-20 ENCOUNTER — Other Ambulatory Visit: Payer: Self-pay | Admitting: Family Medicine

## 2023-08-20 DIAGNOSIS — E781 Pure hyperglyceridemia: Secondary | ICD-10-CM

## 2023-08-20 DIAGNOSIS — E1169 Type 2 diabetes mellitus with other specified complication: Secondary | ICD-10-CM

## 2023-08-20 DIAGNOSIS — E786 Lipoprotein deficiency: Secondary | ICD-10-CM

## 2023-08-20 DIAGNOSIS — G4700 Insomnia, unspecified: Secondary | ICD-10-CM

## 2023-08-21 ENCOUNTER — Telehealth: Payer: Self-pay | Admitting: *Deleted

## 2023-08-21 DIAGNOSIS — E1169 Type 2 diabetes mellitus with other specified complication: Secondary | ICD-10-CM

## 2023-08-21 MED ORDER — METFORMIN HCL 500 MG PO TABS: ORAL_TABLET | ORAL | 1 refills | Status: DC

## 2023-08-21 NOTE — Telephone Encounter (Signed)
Pt called to schedule appointment, he is scheduled to do lab and appt on 08/25/23, he is requesting a refill on below to be sent to [facility] on [address].     metFORMIN (GLUCOPHAGE) 500 MG tablet    zolpidem (AMBIEN) 10 MG tablet

## 2023-08-21 NOTE — Telephone Encounter (Signed)
Refill of metformin sent as requested.  Last fill of zolpidem for 90 tablets was sent on 05/22/2023 to use on as-needed basis, will wait to refill if appropriate at appointment.

## 2023-08-21 NOTE — Telephone Encounter (Signed)
Pt LM in reference to some medication, I tried to call to inquire about this and phone only rang with no option to LVM.

## 2023-08-24 NOTE — Telephone Encounter (Signed)
Informed pt of this on Friday.

## 2023-08-25 ENCOUNTER — Ambulatory Visit: Payer: Managed Care, Other (non HMO) | Admitting: Family Medicine

## 2023-08-25 ENCOUNTER — Encounter: Payer: Self-pay | Admitting: Family Medicine

## 2023-08-25 VITALS — BP 107/70 | HR 77 | Resp 18 | Ht 65.0 in | Wt 148.0 lb

## 2023-08-25 DIAGNOSIS — G4700 Insomnia, unspecified: Secondary | ICD-10-CM

## 2023-08-25 DIAGNOSIS — E1169 Type 2 diabetes mellitus with other specified complication: Secondary | ICD-10-CM | POA: Diagnosis not present

## 2023-08-25 DIAGNOSIS — Z1159 Encounter for screening for other viral diseases: Secondary | ICD-10-CM

## 2023-08-25 DIAGNOSIS — E785 Hyperlipidemia, unspecified: Secondary | ICD-10-CM

## 2023-08-25 DIAGNOSIS — G894 Chronic pain syndrome: Secondary | ICD-10-CM

## 2023-08-25 DIAGNOSIS — Z7984 Long term (current) use of oral hypoglycemic drugs: Secondary | ICD-10-CM

## 2023-08-25 DIAGNOSIS — E786 Lipoprotein deficiency: Secondary | ICD-10-CM

## 2023-08-25 DIAGNOSIS — Z23 Encounter for immunization: Secondary | ICD-10-CM

## 2023-08-25 LAB — POCT GLYCOSYLATED HEMOGLOBIN (HGB A1C): HbA1c POC (<> result, manual entry): 7.3 % (ref 4.0–5.6)

## 2023-08-25 MED ORDER — LANCETS MISC. MISC: 1.0000 | Freq: Three times a day (TID) | 0 refills | Status: AC

## 2023-08-25 MED ORDER — FENOFIBRATE 48 MG PO TABS: 48.0000 mg | ORAL_TABLET | Freq: Every day | ORAL | 1 refills | Status: DC

## 2023-08-25 MED ORDER — DULOXETINE HCL 30 MG PO CPEP: 30.0000 mg | ORAL_CAPSULE | Freq: Two times a day (BID) | ORAL | 1 refills | Status: DC

## 2023-08-25 MED ORDER — ZOLPIDEM TARTRATE 10 MG PO TABS: ORAL_TABLET | ORAL | 1 refills | Status: DC

## 2023-08-25 MED ORDER — BLOOD GLUCOSE MONITORING SUPPL DEVI: 1.0000 | Freq: Three times a day (TID) | 0 refills | Status: DC

## 2023-08-25 MED ORDER — LANCET DEVICE MISC: 1.0000 | Freq: Three times a day (TID) | 0 refills | Status: AC

## 2023-08-25 MED ORDER — BLOOD GLUCOSE TEST VI STRP: 1.0000 | ORAL_STRIP | Freq: Three times a day (TID) | 0 refills | Status: AC

## 2023-08-25 NOTE — Patient Instructions (Signed)
ADULT OTC PAIN MEDICATIONS:  Acetaminophen (Tylenol) - Immediate-release: 325 mg to 500 mg (1 g) orally every 4 OR 650 mg orally every 6 hours - Minimum Dosing Interval: every 4 hours - Maximum Single Dose: 1000 mg - Maximum Dose: 4000 mg per 24 hours  -DO NOT TAKE IF DRINKING ALCOHOL  Ibuprofen (Advil, Midol, Motrin) -200 to 400 mg orally every 4 to 6 hours as needed -Maximum dose: 1200 mg/day (over-the-counter)  -STAY WELL HYDRATED TO REDUCE RISK OF KIDNEY DAMAGE -Increased risk of GI bleeds and kidney damage if taking too much or too often

## 2023-08-25 NOTE — Assessment & Plan Note (Signed)
Last lipid panel: LDL 58, HDL 33, triglycerides 112.  Continue atorvastatin 10 mg daily, Lovaza 1 g daily, fenofibrate 48 mg daily.  Will recheck lipid panel and CMP for medication monitoring today.

## 2023-08-25 NOTE — Assessment & Plan Note (Signed)
A1c improved to 7.3.  Continue metformin 500 mg daily each morning and 1000 mg daily each evening.  Hopeful that back surgery will allow him improved mobility and he will be able to participate in a variety of exercise routines.  If needed in the future, may consider either maximum dose of metformin or SGLT2 medication.

## 2023-08-25 NOTE — Assessment & Plan Note (Signed)
Stable.  Continue Ambien 10 mg nightly.  PDMP reviewed, no aberrancies.  Overdose risk score 370.  Will continue to monitor.

## 2023-08-25 NOTE — Assessment & Plan Note (Signed)
Stable.  Continue Cymbalta 30 mg twice daily.  Discussed to alternate Tylenol with NSAIDs as well as staying hydrated to reduce risk of kidney damage for pain.  He should be getting back surgery upcoming in the next few months.

## 2023-08-25 NOTE — Progress Notes (Signed)
Established Patient Office Visit  Subjective   Patient ID: Richard Noble, male    DOB: 04/08/1966  Age: 57 y.o. MRN: [id number]  Chief Complaint  Patient presents with   Hyperlipidemia   Diabetes   Insomnia    HPI Richard Noble is a 57 y.o. male presenting today for follow up of hyperlipidemia, diabetes, insomnia. Hyperlipidemia: tolerating atorvastatin and fenofibrate well with no myalgias or significant side effects.  The ASCVD Risk score (Arnett DK, et al., 2019) failed to calculate for the following reasons:   The valid total cholesterol range is 130 to 320 mg/dL Diabetes: denies hypoglycemic events, wounds or sores that are not healing well, increased thirst or urination. Denies vision problems, eye exam scheduled for later today.  Taking metformin as prescribed without any side effects.  He has been facing financial hardship and had to cancel his gym membership, so he has not been able to exercise as much as he used to. Insomnia: He struggles with sleep induction. He is taking Ambien nightly as he has been for the past 10 years, denies side effects.    Outpatient Medications Prior to Visit  Medication Sig   atorvastatin (LIPITOR) 10 MG tablet TAKE 1 TABLET BY MOUTH EVERYDAY AT BEDTIME   metFORMIN (GLUCOPHAGE) 500 MG tablet TAKE 1 TABLET EVERY MORNING AND 2 TABLETS EVERY EVENING.   Multiple Vitamin (MULTIVITAMIN) tablet Take 1 tablet by mouth daily.   omega-3 acid ethyl esters (LOVAZA) 1 g capsule TAKE 2 CAPSULES BY MOUTH TWICE A DAY   TURMERIC PO Take 1 tablet by mouth daily.   [DISCONTINUED] DULoxetine (CYMBALTA) 30 MG capsule Take 1 capsule (30 mg total) by mouth 2 (two) times daily.   [DISCONTINUED] fenofibrate (TRICOR) 48 MG tablet Take 1 tablet (48 mg total) by mouth daily.   [DISCONTINUED] zolpidem (AMBIEN) 10 MG tablet TAKE 1 TABLET BY MOUTH EVERY DAY AT BEDTIME AS NEEDED   No facility-administered medications prior to visit.    ROS Negative unless otherwise noted  in HPI   Objective:     BP 107/70 (BP Location: Left Arm, Patient Position: Sitting, Cuff Size: Normal)   Pulse 77   Resp 18   Ht 5\' 5"  (1.651 m)   Wt 148 lb (67.1 kg)   SpO2 99%   BMI 24.63 kg/m   Physical Exam Constitutional:      General: He is not in acute distress.    Appearance: Normal appearance.  HENT:     Head: Normocephalic and atraumatic.  Cardiovascular:     Rate and Rhythm: Normal rate and regular rhythm.     Heart sounds: Normal heart sounds. No murmur heard.    No friction rub. No gallop.  Pulmonary:     Effort: Pulmonary effort is normal. No respiratory distress.     Breath sounds: Normal breath sounds. No wheezing, rhonchi or rales.  Skin:    General: Skin is warm and dry.  Neurological:     Mental Status: He is alert and oriented to person, place, and time.  Psychiatric:        Mood and Affect: Mood normal.     Assessment & Plan:  Type 2 diabetes mellitus with other specified complication, without long-term current use of insulin (HCC) Assessment & Plan: A1c improved to 7.3.  Continue metformin 500 mg daily each morning and 1000 mg daily each evening.  Hopeful that back surgery will allow him improved mobility and he will be able to participate in a variety  of exercise routines.  If needed in the future, may consider either maximum dose of metformin or SGLT2 medication.  Orders: -     POCT glycosylated hemoglobin (Hb A1C) -     Blood Glucose Monitoring Suppl; 1 each by Does not apply route in the morning, at noon, and at bedtime. May substitute to any manufacturer covered by patient's insurance.  Dispense: 1 each; Refill: 0 -     Blood Glucose Test; 1 each by In Vitro route in the morning, at noon, and at bedtime. May substitute to any manufacturer covered by patient's insurance.  Dispense: 100 strip; Refill: 0 -     Lancet Device; 1 each by Does not apply route in the morning, at noon, and at bedtime. May substitute to any manufacturer covered by  patient's insurance.  Dispense: 1 each; Refill: 0 -     Lancets Misc.; 1 each by Does not apply route in the morning, at noon, and at bedtime. May substitute to any manufacturer covered by patient's insurance.  Dispense: 100 each; Refill: 0  Need for influenza vaccination -     Flu vaccine trivalent PF, 6mos and older(Flulaval,Afluria,Fluarix,Fluzone)  Hyperlipidemia associated with type 2 diabetes mellitus (HCC) Assessment & Plan: Last lipid panel: LDL 58, HDL 33, triglycerides 112.  Continue atorvastatin 10 mg daily, Lovaza 1 g daily, fenofibrate 48 mg daily.  Will recheck lipid panel and CMP for medication monitoring today.  Orders: -     Comprehensive metabolic panel; Future -     Lipid panel; Future  Screening for viral disease -     Hepatitis C antibody; Future -     HIV Antibody (routine testing w rflx); Future  Chronic pain associated with significant psychosocial dysfunction Assessment & Plan: Stable.  Continue Cymbalta 30 mg twice daily.  Discussed to alternate Tylenol with NSAIDs as well as staying hydrated to reduce risk of kidney damage for pain.  He should be getting back surgery upcoming in the next few months.  Orders: -     DULoxetine HCl; Take 1 capsule (30 mg total) by mouth 2 (two) times daily.  Dispense: 180 capsule; Refill: 1  Insomnia, unspecified type Assessment & Plan: Stable.  Continue Ambien 10 mg nightly.  PDMP reviewed, no aberrancies.  Overdose risk score 370.  Will continue to monitor.  Orders: -     Zolpidem Tartrate; Take 1 tablet by mouth every day at bedtime as needed  Dispense: 90 tablet; Refill: 1  Low HDL (under 40) -     Fenofibrate; Take 1 tablet (48 mg total) by mouth daily.  Dispense: 90 tablet; Refill: 1    Return in about 4 months (around 12/23/2023) for annual physical, fasting blood work 1 week before.    Melida Quitter, PA

## 2023-08-26 LAB — COMPREHENSIVE METABOLIC PANEL
ALT: 31 [IU]/L (ref 0–44)
AST: 23 [IU]/L (ref 0–40)
Albumin: 5 g/dL — ABNORMAL HIGH (ref 3.8–4.9)
Alkaline Phosphatase: 44 [IU]/L (ref 44–121)
BUN/Creatinine Ratio: 16 (ref 9–20)
BUN: 15 mg/dL (ref 6–24)
Bilirubin Total: 1.1 mg/dL (ref 0.0–1.2)
CO2: 24 mmol/L (ref 20–29)
Calcium: 10 mg/dL (ref 8.7–10.2)
Chloride: 101 mmol/L (ref 96–106)
Creatinine, Ser: 0.91 mg/dL (ref 0.76–1.27)
Globulin, Total: 2.1 g/dL (ref 1.5–4.5)
Glucose: 156 mg/dL — ABNORMAL HIGH (ref 70–99)
Potassium: 4.3 mmol/L (ref 3.5–5.2)
Sodium: 142 mmol/L (ref 134–144)
Total Protein: 7.1 g/dL (ref 6.0–8.5)
eGFR: 98 mL/min/{1.73_m2} (ref >59)

## 2023-08-26 LAB — LIPID PANEL
Chol/HDL Ratio: 3.5 ratio (ref 0.0–5.0)
Cholesterol, Total: 115 mg/dL (ref 100–199)
HDL: 33 mg/dL — ABNORMAL LOW (ref >39)
LDL Chol Calc (NIH): 63 mg/dL (ref 0–99)
Triglycerides: 97 mg/dL (ref 0–149)
VLDL Cholesterol Cal: 19 mg/dL (ref 5–40)

## 2023-08-26 LAB — HIV ANTIBODY (ROUTINE TESTING W REFLEX): HIV Screen 4th Generation wRfx: NONREACTIVE

## 2023-08-26 LAB — HEPATITIS C ANTIBODY: Hep C Virus Ab: NONREACTIVE

## 2023-11-04 ENCOUNTER — Ambulatory Visit: Payer: Managed Care, Other (non HMO) | Admitting: Family Medicine

## 2023-11-04 ENCOUNTER — Ambulatory Visit: Payer: Self-pay | Admitting: Family Medicine

## 2023-11-04 ENCOUNTER — Encounter: Payer: Self-pay | Admitting: Family Medicine

## 2023-11-04 VITALS — BP 105/67 | HR 75 | Ht 65.0 in | Wt 153.5 lb

## 2023-11-04 DIAGNOSIS — R197 Diarrhea, unspecified: Secondary | ICD-10-CM

## 2023-11-04 DIAGNOSIS — G894 Chronic pain syndrome: Secondary | ICD-10-CM

## 2023-11-04 DIAGNOSIS — E1169 Type 2 diabetes mellitus with other specified complication: Secondary | ICD-10-CM | POA: Diagnosis not present

## 2023-11-04 DIAGNOSIS — M5136 Other intervertebral disc degeneration, lumbar region with discogenic back pain only: Secondary | ICD-10-CM

## 2023-11-04 DIAGNOSIS — Z7984 Long term (current) use of oral hypoglycemic drugs: Secondary | ICD-10-CM

## 2023-11-04 DIAGNOSIS — M5030 Other cervical disc degeneration, unspecified cervical region: Secondary | ICD-10-CM

## 2023-11-04 MED ORDER — LANCETS MISC: 3 refills | Status: DC

## 2023-11-04 NOTE — Telephone Encounter (Signed)
Copied from CRM [id number]. Topic: Clinical - Pink Word Triage >> Nov 04, 2023 11:11 AM Richard Noble wrote: Reason for Triage: Patient has been experiencing stomach issues off and on - it is almost daily where he's been experiencing diarrhea. He does not know if it's caused by drinking coffee or milk or something else.   Chief Complaint: worsening diarrhea Symptoms: diarrhea 4-6x/day, 2/10 lower abdominal pain, bloating, 6/10 back pain worsening Frequency: intermittent, continual Pertinent Negatives: Patient denies blood in stool, severe pain Disposition: [] 911 / [] ED /[] Urgent Care (no appt availability in office) / [x] Appointment(In office/virtual)/ []  [facility]/ [] Home Care/ [] Refused Recommended Disposition /[] [facility]/ []  Follow-up with PCP Additional Notes: Pt reporting been experiencing diarrhea that "started on and off" about "a month or month and a half" ago but been worsening with "most of the time loose, very watery" stool "now almost every day" for past 2 weeks. Pt reporting hx of low back issues and kidney issues, reporting diarrhea may be related to his "back pain gotten worse over past few months," pain now 6/10 but advil helps, "been taking a few more advil than usually do" so thinks may be causing stomach upset or with potential "pinched nerve" in back causing bowel changes. Pt reporting that he feels bloating "gas pain" 2/10 in lower abdomen that "correlates with the diarrhea," and has been "very tired," "feel thirsty all the time," but urinating "more than usual," confirms "normal" amounts of urine. Advised pt be examined in next 24 hours, scheduled with PCP today, confirmed location/appt info. Advised ensure hydration, eat bland foods, cut electrolyte drinks with water. Pt verbalized understanding.  Reason for Disposition  [1] MODERATE diarrhea (e.g., 4-6 times / day more than normal) AND [2] present > 48 hours (2 days)  Answer Assessment - Initial Assessment  Questions 1. DIARRHEA SEVERITY: "How bad is the diarrhea?" "How many more stools have you had in the past 24 hours than normal?"    - NO DIARRHEA (SCALE 0)   - MILD (SCALE 1-3): Few loose or mushy BMs; increase of 1-3 stools over normal daily number of stools; mild increase in ostomy output.   -  MODERATE (SCALE 4-7): Increase of 4-6 stools daily over normal; moderate increase in ostomy output.   -  SEVERE (SCALE 8-10; OR "WORST POSSIBLE"): Increase of 7 or more stools daily over normal; moderate increase in ostomy output; incontinence.     2x, 1 yesterday, 1 today, feel like having 4-6x per day at this point, sometimes multiple throughout whole day with stomach pain, Usually in mornings, was blaming it on coffee, feels like gotten worse 2. ONSET: "When did the diarrhea begin?"      Started on and off probably a month or month and a half ago, now almost every day for past 2 weeks 3. BM CONSISTENCY: "How loose or watery is the diarrhea?"      Most of time loose, very watery 4. VOMITING: "Are you also vomiting?" If Yes, ask: "How many times in the past 24 hours?"      denies 5. ABDOMEN PAIN: "Are you having any abdomen pain?" If Yes, ask: "What does it feel like?" (e.g., crampy, dull, intermittent, constant)      abdominal pain correlates with the diarrhea, in lower intestines, maybe left side if had to guess, right now bothering me there, bloating, gas pain 6. ABDOMEN PAIN SEVERITY: If present, ask: "How bad is the pain?"  (e.g., Scale 1-10; mild, moderate, or severe)   -  MILD (1-3): doesn't interfere with normal activities, abdomen soft and not tender to touch    - MODERATE (4-7): interferes with normal activities or awakens from sleep, abdomen tender to touch    - SEVERE (8-10): excruciating pain, doubled over, unable to do any normal activities       2/10 it's light, not pain pain but bloated like a gas pain almost 7. ORAL INTAKE: If vomiting, "Have you been able to drink liquids?" "How much  liquids have you had in the past 24 hours?"     Drinking lots of water 8. HYDRATION: "Any signs of dehydration?" (e.g., dry mouth [not just dry lips], too weak to stand, dizziness, new weight loss) "When did you last urinate?"     Dry mouth, feel thirsty all the time, no dizziness, urinating more than usual, normal amounts of urine 9. EXPOSURE: "Have you traveled to a foreign country recently?" "Have you been exposed to anyone with diarrhea?" "Could you have eaten any food that was spoiled?"     denies 10. ANTIBIOTIC USE: "Are you taking antibiotics now or have you taken antibiotics in the past 2 months?"       denies 11. OTHER SYMPTOMS: "Do you have any other symptoms?" (e.g., fever, blood in stool)       Very tired, not weakness but feel like tired all the time  Protocols used: [facility]

## 2023-11-04 NOTE — Progress Notes (Signed)
Acute Office Visit  Subjective:     Patient ID: Richard Noble, male    DOB: 09-Sep-1966, 58 y.o.   MRN: [id number]  Chief Complaint  Patient presents with   Diarrhea    HPI Patient is in today for diarrhea. Symptoms have been present for approximately 1 month. The symptoms are becoming more frequent.  Diarrhea frequency is now approximately  once  per day. Diarrhea does not occur at night. The patient has noted no bleeding associated with bowel movements. The also patient reports the following symptoms: flatulence. The patient denies the following symptoms: abdominal cramping relieved by defecation, abdominal distension, bloating, cramping unrelieved by defecation, diarrhea alternating with constipation, fecal incontinence, fecal urgency, fever, floating stools, greasy stool, increase in stool volume, melena, mucus with stools, night sweats, and nocturnal diarrhea. The patient currently denies significant abdominal pain or discomfort.   Relationship to food: The diarrhea typically occurs after breakfast. Relationship to medications: The patient is now suspecting that it may be related to his metformin. Other risk factors: diabetes mellitus. Therapy tried so far: none. Work up so far: none.  Richard Noble does have a history of type 2 diabetes and is taking metformin.  Richard Noble has been prescribed metformin for a long time but just started taking it consistently about 3 months ago.  In addition, Richard Noble is requesting a referral to pain management for his back pain.  His financial situation prevents him from being able to afford a gym membership which was the most helpful in managing his pain.  ROS See HPI    Objective:    BP 105/67   Pulse 75   Ht 5\' 5"  (1.651 m)   Wt 153 lb 8 oz (69.6 kg)   SpO2 100%   BMI 25.54 kg/m   Physical Exam Constitutional:      General: Richard Noble is not in acute distress.    Appearance: Normal appearance.  HENT:     Head: Normocephalic and atraumatic.  Cardiovascular:     Rate and  Rhythm: Regular rhythm.     Heart sounds: Normal heart sounds. No murmur heard.    No friction rub. No gallop.  Pulmonary:     Effort: Pulmonary effort is normal. No respiratory distress.     Breath sounds: Normal breath sounds. No wheezing, rhonchi or rales.  Abdominal:     General: Abdomen is flat. Bowel sounds are normal. There is no distension.     Palpations: Abdomen is soft. There is no mass.     Tenderness: There is abdominal tenderness (minimal) in the right lower quadrant.  Skin:    General: Skin is warm and dry.  Neurological:     Mental Status: Richard Noble is alert and oriented to person, place, and time.  Psychiatric:        Mood and Affect: Mood normal.       Assessment & Plan:  Diarrhea, unspecified type  Type 2 diabetes mellitus with other specified complication, without long-term current use of insulin (HCC) Assessment & Plan: Diarrhea is most likely related to consistent use of metformin.  Discussed options of changing medication versus decreasing dose.  Patient would like to start by decreasing the dose to metformin 500 mg daily each morning and 500 mg daily each evening.  Recommended to continue with adequate hydration and increase fiber intake.  If diarrhea does not resolve with decrease in dose, recommend changing medication.   Degeneration of intervertebral disc of lumbar region with discogenic back pain -  Ambulatory referral to Pain Clinic  Chronic pain associated with significant psychosocial dysfunction -     Ambulatory referral to Pain Clinic  DDD (degenerative disc disease), cervical S/P cervical spinal fusion -     Ambulatory referral to Pain Clinic  Referring to pain management for back pain.  I also recommended contacting his insurance company to see how much physical therapy they will pay for, and if they offer any programs to assist with the cost of gym membership's.  Patient verbalized understanding and is agreeable to this plan.  Return if symptoms  worsen or fail to improve.  Melida Quitter, PA

## 2023-11-04 NOTE — Patient Instructions (Addendum)
Cut back on the metformin to 500 mg in the morning and 500 mg in the evening.  Continue taking her doses with food as this decreases the chance of side effects.  Continue staying hydrated, and aim for 30 g of fiber each day to help bulk up your stools.  We will be able to check in at your appointment in March to see how things are going and adjust as needed!  Call your insurance company to ask them about how much physical therapy they will pay for.  You can also ask them if they provide any assistance with gym memberships, a few insurance companies do!

## 2023-11-04 NOTE — Addendum Note (Signed)
Addended by: Saralyn Pilar on: 11/04/2023 03:05 PM   Modules accepted: Orders

## 2023-11-04 NOTE — Assessment & Plan Note (Signed)
Diarrhea is most likely related to consistent use of metformin.  Discussed options of changing medication versus decreasing dose.  Patient would like to start by decreasing the dose to metformin 500 mg daily each morning and 500 mg daily each evening.  Recommended to continue with adequate hydration and increase fiber intake.  If diarrhea does not resolve with decrease in dose, recommend changing medication.

## 2023-11-19 ENCOUNTER — Encounter: Payer: Self-pay | Admitting: Family Medicine

## 2023-11-24 ENCOUNTER — Encounter: Payer: Self-pay | Admitting: Family Medicine

## 2023-11-25 ENCOUNTER — Encounter: Payer: Self-pay | Admitting: Physical Medicine and Rehabilitation

## 2023-12-08 ENCOUNTER — Other Ambulatory Visit: Payer: Self-pay

## 2023-12-08 DIAGNOSIS — E1169 Type 2 diabetes mellitus with other specified complication: Secondary | ICD-10-CM

## 2023-12-10 ENCOUNTER — Other Ambulatory Visit: Payer: Self-pay | Admitting: Family Medicine

## 2023-12-10 DIAGNOSIS — E781 Pure hyperglyceridemia: Secondary | ICD-10-CM

## 2023-12-10 DIAGNOSIS — E1169 Type 2 diabetes mellitus with other specified complication: Secondary | ICD-10-CM

## 2023-12-10 DIAGNOSIS — E786 Lipoprotein deficiency: Secondary | ICD-10-CM

## 2023-12-18 ENCOUNTER — Other Ambulatory Visit: Payer: Self-pay | Admitting: *Deleted

## 2023-12-18 ENCOUNTER — Other Ambulatory Visit: Payer: Managed Care, Other (non HMO)

## 2023-12-18 DIAGNOSIS — E1169 Type 2 diabetes mellitus with other specified complication: Secondary | ICD-10-CM

## 2023-12-18 NOTE — Addendum Note (Signed)
 Addended by: Lamonte Sakai, Elisha Mcgruder D on: 12/18/2023 09:12 AM   Modules accepted: Orders

## 2023-12-19 LAB — COMPREHENSIVE METABOLIC PANEL
ALT: 34 IU/L (ref 0–44)
AST: 21 IU/L (ref 0–40)
Albumin: 4.8 g/dL (ref 3.8–4.9)
Alkaline Phosphatase: 53 IU/L (ref 44–121)
BUN/Creatinine Ratio: 15 (ref 9–20)
BUN: 15 mg/dL (ref 6–24)
Bilirubin Total: 1.1 mg/dL (ref 0.0–1.2)
CO2: 26 mmol/L (ref 20–29)
Calcium: 9.4 mg/dL (ref 8.7–10.2)
Chloride: 100 mmol/L (ref 96–106)
Creatinine, Ser: 0.99 mg/dL (ref 0.76–1.27)
Globulin, Total: 2 g/dL (ref 1.5–4.5)
Glucose: 174 mg/dL — ABNORMAL HIGH (ref 70–99)
Potassium: 4.4 mmol/L (ref 3.5–5.2)
Sodium: 138 mmol/L (ref 134–144)
Total Protein: 6.8 g/dL (ref 6.0–8.5)
eGFR: 89 mL/min/{1.73_m2} (ref >59)

## 2023-12-19 LAB — LIPID PANEL
Chol/HDL Ratio: 3.4 ratio (ref 0.0–5.0)
Cholesterol, Total: 112 mg/dL (ref 100–199)
HDL: 33 mg/dL — ABNORMAL LOW (ref >39)
LDL Chol Calc (NIH): 57 mg/dL (ref 0–99)
Triglycerides: 122 mg/dL (ref 0–149)
VLDL Cholesterol Cal: 22 mg/dL (ref 5–40)

## 2023-12-19 LAB — HEMOGLOBIN A1C
Est. average glucose Bld gHb Est-mCnc: 200 mg/dL
Hgb A1c MFr Bld: 8.6 % — ABNORMAL HIGH (ref 4.8–5.6)

## 2023-12-21 ENCOUNTER — Encounter
Payer: Managed Care, Other (non HMO) | Attending: Physical Medicine and Rehabilitation | Admitting: Physical Medicine and Rehabilitation

## 2023-12-21 ENCOUNTER — Ambulatory Visit
Admission: RE | Admit: 2023-12-21 | Discharge: 2023-12-21 | Disposition: A | Discharge status: Home / Self Care | Source: Ambulatory Visit | Attending: Physical Medicine and Rehabilitation | Admitting: Physical Medicine and Rehabilitation

## 2023-12-21 VITALS — BP 113/71 | HR 70 | Ht 65.0 in | Wt 149.0 lb

## 2023-12-21 DIAGNOSIS — M5030 Other cervical disc degeneration, unspecified cervical region: Secondary | ICD-10-CM | POA: Insufficient documentation

## 2023-12-21 DIAGNOSIS — M25562 Pain in left knee: Secondary | ICD-10-CM | POA: Diagnosis not present

## 2023-12-21 DIAGNOSIS — G894 Chronic pain syndrome: Secondary | ICD-10-CM | POA: Diagnosis not present

## 2023-12-21 DIAGNOSIS — M51362 Other intervertebral disc degeneration, lumbar region with discogenic back pain and lower extremity pain: Secondary | ICD-10-CM | POA: Insufficient documentation

## 2023-12-21 MED ORDER — DICLOFENAC SODIUM 1 % EX GEL: 2.0000 g | Freq: Four times a day (QID) | CUTANEOUS | 2 refills | Status: DC | PRN

## 2023-12-21 MED ORDER — METHOCARBAMOL 500 MG PO TABS: 500.0000 mg | ORAL_TABLET | Freq: Three times a day (TID) | ORAL | 2 refills | Status: DC | PRN

## 2023-12-21 NOTE — Patient Instructions (Addendum)
 Local [facility] PT clinics:  1) [facility] [email or web address] Clinic #: [phone]   2) [facility]  [email or web address] Clinic #: [phone]  3) [facility] [email or web address] Clinic #: [phone]   Please take PT script to one of the clinics above to start PT.  I am getting an xray of your left knee; depending on results, I may send you to sports medicine for an injection. I will message you through MyChart about this.  Use voltaren gel for joint pain and as needed robaxin for muscle tightness and spasms; continue Duloxetine.  Follow up with me in 3 months
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
## 2023-12-25 ENCOUNTER — Encounter: Payer: Managed Care, Other (non HMO) | Admitting: Family Medicine

## 2023-12-27 ENCOUNTER — Encounter: Payer: Self-pay | Admitting: Physical Medicine and Rehabilitation

## 2023-12-31 ENCOUNTER — Encounter: Payer: Self-pay | Admitting: Family Medicine

## 2023-12-31 ENCOUNTER — Ambulatory Visit (INDEPENDENT_AMBULATORY_CARE_PROVIDER_SITE_OTHER): Payer: Managed Care, Other (non HMO) | Admitting: Family Medicine

## 2023-12-31 VITALS — BP 109/71 | HR 68 | Ht 65.0 in | Wt 146.4 lb

## 2023-12-31 DIAGNOSIS — E1169 Type 2 diabetes mellitus with other specified complication: Secondary | ICD-10-CM | POA: Diagnosis not present

## 2023-12-31 DIAGNOSIS — Z0000 Encounter for general adult medical examination without abnormal findings: Secondary | ICD-10-CM | POA: Diagnosis not present

## 2023-12-31 DIAGNOSIS — K529 Noninfective gastroenteritis and colitis, unspecified: Secondary | ICD-10-CM | POA: Diagnosis not present

## 2023-12-31 MED ORDER — FREESTYLE LIBRE 3 SENSOR MISC: 2 refills | Status: DC

## 2023-12-31 MED ORDER — EMPAGLIFLOZIN 10 MG PO TABS: 10.0000 mg | ORAL_TABLET | Freq: Every day | ORAL | 1 refills | Status: DC

## 2023-12-31 MED ORDER — LANCETS MISC: 3 refills | Status: DC

## 2023-12-31 NOTE — Patient Instructions (Addendum)
 It was nice to see you today,  We addressed the following topics today: -Your A1c was elevated today.  Since you are not tolerating metformin I will send in a another medication called Jardiance.  The first month will be 10 mg and then after that we will be 25 mg tablets.  Take them once daily - I will send in a order for the freestyle libre CGM.  I will also send in your lancets refill - I would like you to bring back the stool sample to test for causes of diarrhea and we will refer you to the gastroenterologist. - Try taking Metamucil over-the-counter, start with 1 spoonful a day mixed in water and then increase gradually to 3 times per day.  This can help with diarrhea.  Have a great day,  Richard Jericho, MD

## 2023-12-31 NOTE — Assessment & Plan Note (Signed)
 Elevated A1c at 8.3.  This is due to patient stopping metformin related to complaints of diarrhea.  Not currently on anything.  Will prescribe Jardiance and increase to 25 mg after 1 month.  Follow-up in 3 months.  UACR today.

## 2023-12-31 NOTE — Progress Notes (Signed)
   Annual physical  Subjective   Patient ID: Richard Noble, male    DOB: Dec 19, 1965  Age: 58 y.o. MRN: [id number]  Chief Complaint  Patient presents with   Annual Exam   HPI Richard Noble is a 58 y.o. old male here  for annual exam.    Patient taking Lipitor Cymbalta Ambien.  Not taking Robaxin, only took this once.  No longer taking metformin due to diarrhea.  Patient has concerns about the "lump" on the back of his right thigh.  Notices it a few months ago and stretching.  It is nontender.  Patient been complaining of diarrhea for several months.  Stopped taking metformin due to this and diarrhea improved slightly but is still present.  Is not every day.  No blood or dark tarry stools.  Work: on disability .  Relationship:married Children:3 children, 18, 18, 16.   Tobacco:no Alcohol:no Recreational drugs:no  Diet:low carb.   Exercise:not currently.  Stretching.   Family history of prostate or colorectal cancer:no   Other providers:PMR, optometrist - [facility]?   The ASCVD Risk score (Arnett DK, et al., 2019) failed to calculate for the following reasons:   The valid total cholesterol range is 130 to 320 mg/dL  Health Maintenance Due  Topic Date Due   OPHTHALMOLOGY EXAM  10/10/2021   COVID-19 Vaccine (5 - 2024-25 season) 06/14/2023   Diabetic kidney evaluation - Urine ACR  12/25/2023      Objective:     BP 109/71   Pulse 68   Ht 5\' 5"  (1.651 m)   Wt 146 lb 6.4 oz (66.4 kg)   SpO2 99%   BMI 24.36 kg/m    Physical Exam General: Alert, oriented CV: Regular rate and rhythm Pulmonary: See above. GI: Soft, normal bowel sounds MSK: No lumps or nodules.  The location the patient identified is consistent with biceps femoris   No results found for any visits on 12/31/23.      Assessment & Plan:   Physical exam, annual  Type 2 diabetes mellitus with other specified complication, without long-term current use of insulin (HCC) Assessment & Plan: Elevated  A1c at 8.3.  This is due to patient stopping metformin related to complaints of diarrhea.  Not currently on anything.  Will prescribe Jardiance and increase to 25 mg after 1 month.  Follow-up in 3 months.  UACR today.  Orders: -     Microalbumin / creatinine urine ratio -     Lancets; Use lancets to check blood sugar daily.  Dispense: 200 each; Refill: 3  Chronic diarrhea Assessment & Plan: Several months of nonbloody diarrhea.  Not with every bowel movement and not every day.  Persisted even after stopping metformin, although did improve somewhat.  Will get fecal calprotectin and GI pathogen panel.  Will then refer to gastroenterology.  Recommended trialing an elimination of lactose and then gluten.  Recommended Metamucil fiber supplementation.  Orders: -     Calprotectin, Fecal; Future -     GI Profile, Stool, PCR; Future  Other orders -     Empagliflozin; Take 1 tablet (10 mg total) by mouth daily before breakfast.  Dispense: 30 tablet; Refill: 1 -     FreeStyle Libre 3 Sensor; Place 1 sensor on the skin every 14 days. Use to check glucose continuously  Dispense: 2 each; Refill: 2     Return in about 3 months (around 04/01/2024) for DM.    Sandre Kitty, MD

## 2023-12-31 NOTE — Assessment & Plan Note (Signed)
 Several months of nonbloody diarrhea.  Not with every bowel movement and not every day.  Persisted even after stopping metformin, although did improve somewhat.  Will get fecal calprotectin and GI pathogen panel.  Will then refer to gastroenterology.  Recommended trialing an elimination of lactose and then gluten.  Recommended Metamucil fiber supplementation.

## 2024-01-01 LAB — MICROALBUMIN / CREATININE URINE RATIO
Creatinine, Urine: 68.8 mg/dL
Microalb/Creat Ratio: 11 mg/g{creat} (ref 0–29)
Microalbumin, Urine: 7.6 ug/mL

## 2024-01-05 ENCOUNTER — Encounter: Payer: Self-pay | Admitting: Family Medicine

## 2024-01-06 ENCOUNTER — Encounter: Payer: Self-pay | Admitting: Family Medicine

## 2024-01-06 LAB — GI PROFILE, STOOL, PCR

## 2024-01-06 LAB — CALPROTECTIN, FECAL: Calprotectin, Fecal: 23 ug/g (ref 0–120)

## 2024-01-07 ENCOUNTER — Other Ambulatory Visit: Payer: Self-pay | Admitting: Family Medicine

## 2024-01-07 ENCOUNTER — Encounter: Payer: Self-pay | Admitting: Physician Assistant

## 2024-01-07 DIAGNOSIS — K529 Noninfective gastroenteritis and colitis, unspecified: Secondary | ICD-10-CM

## 2024-02-01 ENCOUNTER — Other Ambulatory Visit: Payer: Self-pay | Admitting: Family Medicine

## 2024-02-02 ENCOUNTER — Other Ambulatory Visit: Payer: Self-pay | Admitting: Family Medicine

## 2024-02-02 DIAGNOSIS — G4700 Insomnia, unspecified: Secondary | ICD-10-CM

## 2024-02-14 ENCOUNTER — Other Ambulatory Visit: Payer: Self-pay | Admitting: Family Medicine

## 2024-02-14 DIAGNOSIS — G894 Chronic pain syndrome: Secondary | ICD-10-CM

## 2024-02-14 DIAGNOSIS — E781 Pure hyperglyceridemia: Secondary | ICD-10-CM

## 2024-02-14 DIAGNOSIS — E1169 Type 2 diabetes mellitus with other specified complication: Secondary | ICD-10-CM

## 2024-02-14 DIAGNOSIS — E786 Lipoprotein deficiency: Secondary | ICD-10-CM

## 2024-03-02 NOTE — Progress Notes (Deleted)
 03/02/2024 Richard Noble [id number] 30-Dec-1965  Referring provider: Laneta Pintos, MD Primary GI doctor: Dr. Karene Oto  ASSESSMENT AND PLAN:  Chronic diarrhea With opioid dependence 11/07/2020 colon Dr. Karene Oto 4 mm polyp, tics sigmoid, hemorrhoids 01/04/2024 Norovirus, fecal cal negative  RUQ pain Uncomplicated cholelithiasis. Gallbladder polyps measuring to 4 mm are also identified along the nondependent wall.  Diabetes type 2 A1C 8.6    Patient Care Team: Laneta Pintos, MD as PCP - General (Family Medicine) Nino Bass, Arta Lark, MD (Inactive) as Resident (Family Medicine) [name], Raelene Bullocks, MD as Consulting Physician (Neurosurgery) Kearney Passer, MD as Consulting Physician (Physical Medicine and Rehabilitation) [name], [name], OD (Optometry)  HISTORY OF PRESENT ILLNESS: 58 y.o. male with a past medical history listed below presents for evaluation of ***.   *** Discussed the use of AI scribe software for clinical note transcription with the patient, who gave verbal consent to proceed.  History of Present Illness            He  reports that he has never smoked. He has never been exposed to tobacco smoke. He has never used smokeless tobacco. He reports current alcohol use. He reports that he does not use drugs.  RELEVANT GI HISTORY, IMAGING AND LABS: Results          CBC    Component Value Date/Time   WBC 6.0 12/22/2022 0908   WBC 7.5 05/05/2018 0042   RBC 4.73 12/22/2022 0908   RBC 4.34 05/05/2018 0042   HGB 13.6 12/22/2022 0908   HCT 41.2 12/22/2022 0908   PLT 238 12/22/2022 0908   MCV 87 12/22/2022 0908   MCH 28.8 12/22/2022 0908   MCH 29.0 05/05/2018 0042   MCHC 33.0 12/22/2022 0908   MCHC 33.5 05/05/2018 0042   RDW 12.8 12/22/2022 0908   LYMPHSABS 2.4 12/22/2022 0908   MONOABS 0.6 02/09/2017 1727   EOSABS 0.3 12/22/2022 0908   BASOSABS 0.0 12/22/2022 0908   No results for input(s): "HGB" in the last 8760 hours.  CMP      Component Value Date/Time   NA 138 12/18/2023 0900   K 4.4 12/18/2023 0900   CL 100 12/18/2023 0900   CO2 26 12/18/2023 0900   GLUCOSE 174 (H) 12/18/2023 0900   GLUCOSE 134 (H) 05/05/2018 0042   BUN 15 12/18/2023 0900   CREATININE 0.99 12/18/2023 0900   CALCIUM  9.4 12/18/2023 0900   PROT 6.8 12/18/2023 0900   ALBUMIN 4.8 12/18/2023 0900   AST 21 12/18/2023 0900   ALT 34 12/18/2023 0900   ALKPHOS 53 12/18/2023 0900   BILITOT 1.1 12/18/2023 0900   GFRNONAA 92 05/14/2020 0951   GFRAA 107 05/14/2020 0951      Latest Ref Rng & Units 12/18/2023    9:00 AM 08/25/2023    8:36 AM 12/22/2022    9:08 AM  Hepatic Function  Total Protein 6.0 - 8.5 g/dL 6.8  7.1  6.7   Albumin 3.8 - 4.9 g/dL 4.8  5.0  4.5   AST 0 - 40 IU/L 21  23  17    ALT 0 - 44 IU/L 34  31  25   Alk Phosphatase 44 - 121 IU/L 53  44  49   Total Bilirubin 0.0 - 1.2 mg/dL 1.1  1.1  0.7       Current Medications:   Current Outpatient Medications (Endocrine & Metabolic):    empagliflozin  (JARDIANCE ) 25 MG TABS tablet, Take 1 tablet (25 mg total) by  mouth daily before breakfast.  Current Outpatient Medications (Cardiovascular):    atorvastatin  (LIPITOR) 10 MG tablet, TAKE 1 TABLET BY MOUTH EVERYDAY AT BEDTIME   omega-3 acid ethyl esters (LOVAZA ) 1 g capsule, TAKE 2 CAPSULES BY MOUTH TWICE A DAY     Current Outpatient Medications (Other):    Blood Glucose Monitoring Suppl DEVI, 1 each by Does not apply route in the morning, at noon, and at bedtime. May substitute to any manufacturer covered by patient's insurance.   Continuous Glucose Sensor (FREESTYLE LIBRE 3 SENSOR) MISC, Place 1 sensor on the skin every 14 days. Use to check glucose continuously   diclofenac  Sodium (VOLTAREN  ARTHRITIS PAIN) 1 % GEL, Apply 2 g topically 4 (four) times daily as needed (pain). Apply to right knee and neck, shoulders as needed up to 4x daily   DULoxetine  (CYMBALTA ) 30 MG capsule, TAKE 1 CAPSULE BY MOUTH 2 TIMES DAILY.   Lancets MISC,  Use lancets to check blood sugar daily.   Multiple Vitamin (MULTIVITAMIN) tablet, Take 1 tablet by mouth daily.   TURMERIC PO, Take 1 tablet by mouth daily.   zolpidem  (AMBIEN ) 10 MG tablet, TAKE 1 TABLET BY MOUTH EVERY DAY AT BEDTIME AS NEEDED  Medical History:  Past Medical History:  Diagnosis Date   Anemia    as child    Anxiety    uses valium  for anxiety related to pain.    Arthritis    stenosis, cervical area, arthritis  - spine    Diabetes mellitus without complication (HCC)    treated with diet only- undercontrol per pt    History of exercise stress test     in [place]rs[place] ago had stress test & he was followed by cardiologist for a couple yrs. , but hasn't been referred since he has lived here.    History of kidney stones    found incidentally - no problems, just stable    Hyperlipidemia    borderline no meds   Sleep concern    study done in [place], told that it was normal, sleep issue related to pain.     Allergies:  Allergies  Allergen Reactions   Trazodone And Nefazodone Other (See Comments)    Passes out     Surgical History:  He  has a past surgical history that includes Back surgery (2012 ); Back surgery (2016); Appendectomy (1984); Anterior cervical decomp/discectomy fusion (N/A, 09/01/2016); and Colonoscopy (2008). Family History:  His family history includes Diabetes in his brother, brother, mother, and sister.  REVIEW OF SYSTEMS  : All other systems reviewed and negative except where noted in the History of Present Illness.  PHYSICAL EXAM: There were no vitals taken for this visit. Physical Exam          Edmonia Gottron, PA-C 12:08 PM

## 2024-03-03 ENCOUNTER — Ambulatory Visit: Admitting: Physician Assistant

## 2024-03-18 ENCOUNTER — Other Ambulatory Visit: Payer: Self-pay | Admitting: Family Medicine

## 2024-03-18 DIAGNOSIS — G894 Chronic pain syndrome: Secondary | ICD-10-CM

## 2024-03-21 ENCOUNTER — Encounter: Admitting: Physical Medicine and Rehabilitation

## 2024-03-21 ENCOUNTER — Ambulatory Visit: Payer: Self-pay

## 2024-03-21 NOTE — Telephone Encounter (Signed)
     FYI Only or Action Required?: FYI only for provider  Patient was last seen in primary care on 12/31/2023 by Laneta Pintos, MD. Called Nurse Triage reporting Fever. Symptoms began several days ago. Interventions attempted: OTC medications: Advil. Symptoms are: unchanged.  Triage Disposition: See HCP Within 4 Hours (Or PCP Triage)  Patient/caregiver understands and will follow disposition?: Yes   Copied From CRM [id number]. Reason for Triage: Patient called in with complaints of chills, body aches, lumbar pain, fever 102.. Patient can be contacted at [phone].   Reason for Disposition  Severe chills (i.e., feeling extremely cold WITH shaking chills)  Answer Assessment - Initial Assessment Questions 1. TEMPERATURE: "What is the most recent temperature?"  "How was it measured?"      102.3 2. ONSET: "When did the fever start?"      yesterday 3. CHILLS: "Do you have chills?" If yes: "How bad are they?"  (e.g., none, mild, moderate, severe)   - NONE: no chills   - MILD: feeling cold   - MODERATE: feeling very cold, some shivering (feels better under a thick blanket)   - SEVERE: feeling extremely cold with shaking chills (general body shaking, rigors; even under a thick blanket)      Chills, severe 4. OTHER SYMPTOMS: "Do you have any other symptoms besides the fever?"  (e.g., abdomen pain, cough, diarrhea, earache, headache, sore throat, urination pain)     Body aches, dizziness, vertigo 5. CAUSE: If there are no symptoms, ask: "What do you think is causing the fever?"      unknown 6. CONTACTS: "Does anyone else in the family have an infection?"     States did have a tick on him on Friday 7. TREATMENT: "What have you done so far to treat this fever?" (e.g., medications)     advil 8. IMMUNOCOMPROMISE: "Do you have of the following: diabetes, HIV positive, splenectomy, cancer chemotherapy, chronic steroid treatment, transplant patient, etc."     DM 9. PREGNANCY: "Is there any  chance you are pregnant?" "When was your last menstrual period?"     na 10. TRAVEL: "Have you traveled out of the country in the last month?" (e.g., travel history, exposures)       Denies.  Protocols used: [facility]

## 2024-04-01 ENCOUNTER — Encounter: Payer: Self-pay | Admitting: Family Medicine

## 2024-04-01 ENCOUNTER — Ambulatory Visit: Admitting: Family Medicine

## 2024-04-01 VITALS — BP 104/69 | HR 71 | Ht 65.0 in | Wt 141.4 lb

## 2024-04-01 DIAGNOSIS — E786 Lipoprotein deficiency: Secondary | ICD-10-CM

## 2024-04-01 DIAGNOSIS — E781 Pure hyperglyceridemia: Secondary | ICD-10-CM

## 2024-04-01 DIAGNOSIS — E1169 Type 2 diabetes mellitus with other specified complication: Secondary | ICD-10-CM | POA: Diagnosis not present

## 2024-04-01 DIAGNOSIS — G894 Chronic pain syndrome: Secondary | ICD-10-CM | POA: Diagnosis not present

## 2024-04-01 DIAGNOSIS — E785 Hyperlipidemia, unspecified: Secondary | ICD-10-CM

## 2024-04-01 DIAGNOSIS — K529 Noninfective gastroenteritis and colitis, unspecified: Secondary | ICD-10-CM

## 2024-04-01 LAB — POCT GLYCOSYLATED HEMOGLOBIN (HGB A1C): HbA1c POC (<> result, manual entry): 7.1 % (ref 4.0–5.6)

## 2024-04-01 MED ORDER — OMEGA-3-ACID ETHYL ESTERS 1 G PO CAPS: 2.0000 | ORAL_CAPSULE | Freq: Two times a day (BID) | ORAL | 0 refills | Status: AC

## 2024-04-01 NOTE — Assessment & Plan Note (Signed)
 LDL 57, goal <100. Liver and kidney function tests normal 3 months ago. - Continue current statin therapy - Annual monitoring of lipid panel

## 2024-04-01 NOTE — Assessment & Plan Note (Signed)
 Well controlled on Cymbalta , reports significant improvement compared to previous Tramadol. - Continue current regimen

## 2024-04-01 NOTE — Patient Instructions (Signed)
 It was nice to see you today,  We addressed the following topics today: -Your A1c was 7.1.  This is better.  I would like it to be somewhere between 6 and 7 if possible. - We will follow back in 4 months. - Reach out to your eye doctor to check your yearly diabetic retinopathy screening.  Have a great day,  Etha Henle, MD

## 2024-04-01 NOTE — Progress Notes (Signed)
 Established Patient Office Visit  Subjective   Patient ID: Richard Noble, male    DOB: [birth year]  Age: 58 y.o. MRN: [id number]  Chief Complaint  Patient presents with   Medical Management of Chronic Issues    HPI  Subjective - Presents for follow-up of diabetes, reports had minor virus last week with fever, went to urgent care, negative for flu and COVID - Reports diarrhea improved with Metamucil - Taking Jardiance  for diabetes, missed a few doses due to once daily schedule - Previously on Metformin  for 5 years without diarrhea issues - Reports stress eating affecting blood sugar control - Reports Cymbalta  helps significantly with pain management, discontinued Tramadol - No numbness or tingling in feet - Reports virus last week caused fever, aches, pains, worsened back and neck pain  Medications: Jardiance  (empagliflozin ) once daily for diabetes, Cymbalta  (duloxetine ) for pain, statin for cholesterol, Omega-3/fish oil, Metamucil once daily.  PMH, PSH, FH, Social Hx: Diabetes, hyperlipidemia, chronic pain.  ROS: Denies numbness or tingling in feet. Reports recent fever, body aches, back and neck pain with viral illness last week.    The ASCVD Risk score (Arnett DK, et al., 2019) failed to calculate for the following reasons:   The valid total cholesterol range is 130 to 320 mg/dL  Health Maintenance Due  Topic Date Due   OPHTHALMOLOGY EXAM  10/10/2021   COVID-19 Vaccine (5 - 2024-25 season) 06/14/2023      Objective:     BP 104/69   Pulse 71   Ht 5' 5 (1.651 m)   Wt 141 lb 6.4 oz (64.1 kg)   SpO2 99%   BMI 23.53 kg/m    Physical Exam Gen: alert, oriented Pulm: no resp distress Psych: pleasant affect   Results for orders placed or performed in visit on 04/01/24  POCT glycosylated hemoglobin (Hb A1C)  Result Value Ref Range   Hemoglobin A1C     HbA1c POC (<> result, manual entry) 7.1 4.0 - 5.6 %   HbA1c, POC (prediabetic range)     HbA1c, POC  (controlled diabetic range)          Assessment & Plan:   Type 2 diabetes mellitus with other specified complication, without long-term current use of insulin (HCC) Assessment & Plan:  A1C improved to 7.1. Taking Jardiance . Previously on Metformin  for 5 years without GI issues prior to recent issues with diarrhea.. Goal A1C between 6-7. - continue Jardiance . Pt wants to hold off on restarting metformin  at this time.  - Continue monitoring blood glucose with glucometer - Discussed dietary management including limiting high-sugar fruits - Follow up in 4 months for A1C recheck - Due for annual diabetic eye exam - foot exam normal today  Orders: -     POCT glycosylated hemoglobin (Hb A1C)  Hyperlipidemia associated with type 2 diabetes mellitus (HCC) Assessment & Plan:  LDL 57, goal <100. Liver and kidney function tests normal 3 months ago. - Continue current statin therapy - Annual monitoring of lipid panel  Orders: -     Omega-3-acid  Ethyl Esters; Take 2 capsules (2 g total) by mouth 2 (two) times daily.  Dispense: 360 capsule; Refill: 0  High triglycerides -     Omega-3-acid  Ethyl Esters; Take 2 capsules (2 g total) by mouth 2 (two) times daily.  Dispense: 360 capsule; Refill: 0  Low HDL (under 40) -     Omega-3-acid  Ethyl Esters; Take 2 capsules (2 g total) by mouth 2 (two) times daily.  Dispense:  360 capsule; Refill: 0  Chronic pain associated with significant psychosocial dysfunction Assessment & Plan: Well controlled on Cymbalta , reports significant improvement compared to previous Tramadol. - Continue current regimen   Chronic diarrhea Assessment & Plan: Improved.  Taking metamucil daily.       Return in about 4 months (around 08/01/2024) for DM.    Laneta Pintos, MD

## 2024-04-01 NOTE — Assessment & Plan Note (Signed)
 Improved.  Taking metamucil daily.

## 2024-04-01 NOTE — Assessment & Plan Note (Signed)
 A1C improved to 7.1. Taking Jardiance . Previously on Metformin  for 5 years without GI issues prior to recent issues with diarrhea.. Goal A1C between 6-7. - continue Jardiance . Pt wants to hold off on restarting metformin  at this time.  - Continue monitoring blood glucose with glucometer - Discussed dietary management including limiting high-sugar fruits - Follow up in 4 months for A1C recheck - Due for annual diabetic eye exam - foot exam normal today

## 2024-04-20 ENCOUNTER — Encounter: Attending: Physical Medicine and Rehabilitation | Admitting: Physical Medicine and Rehabilitation

## 2024-04-21 NOTE — Progress Notes (Deleted)
 04/21/2024 Richard Noble [id number] 1966-05-25  Referring provider: [name], MD Primary GI doctor: Dr. [name]  ASSESSMENT AND PLAN:  Diarrhea 01/04/2019 5 GI pathogen stool positive norovirus, fecal calprotectin 23 -KUB to evaluate for stool burden/obstruction - stool samples to rule out infection, giardia testing  -CRP/ESR to rule out inflammation, consider fecal calprotectin if elevated - TSH  -Pancreatic elastase/fecal fat with risk factors and description of the stools  -TTG/IGA to evaluate for celiac disease.  -Can do trial of IBGARD daily,  {acantispasmodic:27420} as needed -Add on citracel/benefiber, FODMAP, trial off lactulose and lifestyle changes discussed -{acdiarrheameds:28488} - possible pelvic floor component, consider referral to PT, information given -{acegdcolon:28265::Will schedule for endoscopic evaluation, discussed with patient and agrees with plan.} -Consider SIBO testing or xifaxin trial pending results  Personal history of colon polyps 11/07/2020 colonoscopy Dr. [name] for screening good bowel prep 4 mm TA polyp ascending colon, diverticulosis nonbleeding internal hemorrhoids recall 7 years  Cholelithiasis seen on imaging without inflammation  Patient has no symptoms at this time, labs are normal.  -We went over symptoms of cholecystitis, cholangitis and pancreatitis that would prompt him to return to the ER if they happen.  -Advised low fat diet.  -Agrees to monitor at his time.    Patient Care Team: [name], MD as PCP - General (Family Medicine) [name], MD (Inactive) as Resident (Family Medicine) [name], [name], MD as Consulting Physician (Neurosurgery) [name], MD as Consulting Physician (Physical Medicine and Rehabilitation) [name], [name], OD (Optometry)  HISTORY OF PRESENT ILLNESS: 58 y.o. male with a past medical history listed below presents for evaluation of Diarrhea.  Last seen in  the office 11/07/2020 with Dr. [name] for screening good bowel prep 4 mm TA polyp ascending colon, diverticulosis nonbleeding internal hemorrhoids recall 7 years   *** Discussed the use of AI scribe software for clinical note transcription with the patient, who gave verbal consent to proceed.  History of Present Illness            He  reports that he has never smoked. He has never been exposed to tobacco smoke. He has never used smokeless tobacco. He reports current alcohol use. He reports that he does not use drugs.  RELEVANT GI HISTORY, IMAGING AND LABS: Results          CBC    Component Value Date/Time   WBC 6.0 12/22/2022 0908   WBC 7.5 05/05/2018 0042   RBC 4.73 12/22/2022 0908   RBC 4.34 05/05/2018 0042   HGB 13.6 12/22/2022 0908   HCT 41.2 12/22/2022 0908   PLT 238 12/22/2022 0908   MCV 87 12/22/2022 0908   MCH 28.8 12/22/2022 0908   MCH 29.0 05/05/2018 0042   MCHC 33.0 12/22/2022 0908   MCHC 33.5 05/05/2018 0042   RDW 12.8 12/22/2022 0908   LYMPHSABS 2.4 12/22/2022 0908   MONOABS 0.6 02/09/2017 1727   EOSABS 0.3 12/22/2022 0908   BASOSABS 0.0 12/22/2022 0908   No results for input(s): HGB in the last 8760 hours.  CMP     Component Value Date/Time   NA 138 12/18/2023 0900   K 4.4 12/18/2023 0900   CL 100 12/18/2023 0900   CO2 26 12/18/2023 0900   GLUCOSE 174 (H) 12/18/2023 0900   GLUCOSE 134 (H) 05/05/2018 0042   BUN 15 12/18/2023 0900   CREATININE 0.99 12/18/2023 0900   CALCIUM  9.4 12/18/2023 0900   PROT 6.8 12/18/2023 0900   ALBUMIN  4.8 12/18/2023 0900   AST 21 12/18/2023 0900   ALT 34 12/18/2023 0900   ALKPHOS 53 12/18/2023 0900   BILITOT 1.1 12/18/2023 0900   GFRNONAA 92 05/14/2020 0951   GFRAA 107 05/14/2020 0951      Latest Ref Rng & Units 12/18/2023    9:00 AM 08/25/2023    8:36 AM 12/22/2022    9:08 AM  Hepatic Function  Total Protein 6.0 - 8.5 g/dL 6.8  7.1  6.7   Albumin 3.8 - 4.9 g/dL 4.8  5.0  4.5   AST 0 - 40 IU/L 21  23  17     ALT 0 - 44 IU/L 34  31  25   Alk Phosphatase 44 - 121 IU/L 53  44  49   Total Bilirubin 0.0 - 1.2 mg/dL 1.1  1.1  0.7       Current Medications:   Current Outpatient Medications (Endocrine & Metabolic):    empagliflozin  (JARDIANCE ) 25 MG TABS tablet, Take 1 tablet (25 mg total) by mouth daily before breakfast.  Current Outpatient Medications (Cardiovascular):    atorvastatin  (LIPITOR) 10 MG tablet, TAKE 1 TABLET BY MOUTH EVERYDAY AT BEDTIME   omega-3 acid ethyl esters (LOVAZA ) 1 g capsule, Take 2 capsules (2 g total) by mouth 2 (two) times daily.     Current Outpatient Medications (Other):    Blood Glucose Monitoring Suppl DEVI, 1 each by Does not apply route in the morning, at noon, and at bedtime. May substitute to any manufacturer covered by patient's insurance.   Continuous Glucose Sensor (FREESTYLE LIBRE 3 SENSOR) MISC, Place 1 sensor on the skin every 14 days. Use to check glucose continuously   diclofenac  Sodium (VOLTAREN  ARTHRITIS PAIN) 1 % GEL, Apply 2 g topically 4 (four) times daily as needed (pain). Apply to right knee and neck, shoulders as needed up to 4x daily   DULoxetine  (CYMBALTA ) 30 MG capsule, TAKE 1 CAPSULE BY MOUTH TWICE A DAY (INS. LIMIT 30 CAPSULES)   Lancets MISC, Use lancets to check blood sugar daily.   Multiple Vitamin (MULTIVITAMIN) tablet, Take 1 tablet by mouth daily.   TURMERIC PO, Take 1 tablet by mouth daily.   zolpidem  (AMBIEN ) 10 MG tablet, TAKE 1 TABLET BY MOUTH EVERY DAY AT BEDTIME AS NEEDED  Medical History:  Past Medical History:  Diagnosis Date   Anemia    as child    Anxiety    uses valium  for anxiety related to pain.    Arthritis    stenosis, cervical area, arthritis  - spine    Diabetes mellitus without complication (HCC)    treated with diet only- undercontrol per pt    History of exercise stress test     in [place] 20 yrs[place] ago had stress test & he was followed by cardiologist for a couple yrs. , but hasn't been referred since  he has lived here.    History of kidney stones    found incidentally - no problems, just stable    Hyperlipidemia    borderline no meds   Sleep concern    study done in [place], told that it was normal, sleep issue related to pain.     Allergies:  Allergies  Allergen Reactions   Trazodone And Nefazodone Other (See Comments)    Passes out     Surgical History:  He  has a past surgical history that includes Back surgery (2012 ); Back surgery (2016); Appendectomy (1984); Anterior cervical decomp/discectomy fusion (N/A, 09/01/2016);  and Colonoscopy (2008). Family History:  His family history includes Diabetes in his brother, brother, mother, and sister.  REVIEW OF SYSTEMS  : All other systems reviewed and negative except where noted in the History of Present Illness.  PHYSICAL EXAM: There were no vitals taken for this visit. Physical Exam          Alan JONELLE Coombs, PA-C 9:17 AM

## 2024-04-22 ENCOUNTER — Ambulatory Visit: Admitting: Physician Assistant

## 2024-05-06 ENCOUNTER — Other Ambulatory Visit: Payer: Self-pay | Admitting: Family Medicine

## 2024-05-11 ENCOUNTER — Other Ambulatory Visit: Payer: Self-pay | Admitting: Family Medicine

## 2024-05-11 DIAGNOSIS — G894 Chronic pain syndrome: Secondary | ICD-10-CM

## 2024-05-13 ENCOUNTER — Encounter: Payer: Self-pay | Admitting: Family Medicine

## 2024-05-13 MED ORDER — METFORMIN HCL ER 500 MG PO TB24: 500.0000 mg | ORAL_TABLET | Freq: Every day | ORAL | 1 refills | Status: DC

## 2024-06-08 ENCOUNTER — Other Ambulatory Visit: Payer: Self-pay | Admitting: Family Medicine

## 2024-06-08 DIAGNOSIS — E786 Lipoprotein deficiency: Secondary | ICD-10-CM

## 2024-06-08 DIAGNOSIS — E1169 Type 2 diabetes mellitus with other specified complication: Secondary | ICD-10-CM

## 2024-06-08 DIAGNOSIS — E781 Pure hyperglyceridemia: Secondary | ICD-10-CM

## 2024-06-16 ENCOUNTER — Ambulatory Visit: Admitting: Physician Assistant

## 2024-06-16 NOTE — Progress Notes (Deleted)
 06/16/2024 RAFEL GARDE [id number] [birth year]  Referring provider: [name], MD Primary GI doctor: {acdocs:[id number]}  ASSESSMENT AND PLAN:  Diarrhea 01/04/2024 GI pathogen stool positive norovirus, fecal calprotectin 23 -KUB to evaluate for stool burden/obstruction - stool samples to rule out infection, giardia testing  -CRP/ESR to rule out inflammation, consider fecal calprotectin if elevated - TSH  -Pancreatic elastase/fecal fat with risk factors and description of the stools  -TTG/IGA to evaluate for celiac disease.  -Can do trial of IBGARD daily,  {acantispasmodic:27420} as needed -Add on citracel/benefiber, FODMAP, trial off lactulose and lifestyle changes discussed -{acdiarrheameds:28488} - possible pelvic floor component, consider referral to PT, information given -{acegdcolon:28265::Will schedule for endoscopic evaluation, discussed with patient and agrees with plan.} -Consider SIBO testing or xifaxin trial pending results  Personal history of colon polyps 11/07/2020 colonoscopy Dr. [name] for screening good bowel prep 4 mm TA polyp ascending colon, diverticulosis nonbleeding internal hemorrhoids recall 7 years Recall 10/2027  Cholelithiasis and 4 mm polyp seen 2018 02/09/2017 RUQ US  uncomplicated cholelithiasis gallbladder polyp measures 4 mm identified along the nondependent wall -Patient has no symptoms at this time, labs are normal.  -Patient with gallbladder polyp  <=5 mm, please repeat right upper quadrant abdominal ultrasound every 6 months for 1 year if the polyp size remains stable, annually thereafter.  -We went over symptoms of cholecystitis, cholangitis and pancreatitis that would prompt him to return to the ER if they happen.  -Advised low fat diet.  -Agrees to monitor at his time.    Patient Care Team: [name], MD as PCP - General (Family Medicine) [name], MD (Inactive) as Resident (Family Medicine) [name], [name],  MD as Consulting Physician (Neurosurgery) [name], MD as Consulting Physician (Physical Medicine and Rehabilitation) [name], [name], OD (Optometry)  HISTORY OF PRESENT ILLNESS: 58 y.o. male with a past medical history listed below presents for evaluation of diarrhea.   Last seen in the office 11/07/2020 with Dr. [name] for screening good bowel prep 4 mm TA polyp ascending colon, diverticulosis nonbleeding internal hemorrhoids recall 7 years  *** Discussed the use of AI scribe software for clinical note transcription with the patient, who gave verbal consent to proceed.  History of Present Illness            He  reports that he has never smoked. He has never been exposed to tobacco smoke. He has never used smokeless tobacco. He reports current alcohol use. He reports that he does not use drugs.  RELEVANT GI HISTORY, IMAGING AND LABS: Results          CBC    Component Value Date/Time   WBC 6.0 12/22/2022 0908   WBC 7.5 05/05/2018 0042   RBC 4.73 12/22/2022 0908   RBC 4.34 05/05/2018 0042   HGB 13.6 12/22/2022 0908   HCT 41.2 12/22/2022 0908   PLT 238 12/22/2022 0908   MCV 87 12/22/2022 0908   MCH 28.8 12/22/2022 0908   MCH 29.0 05/05/2018 0042   MCHC 33.0 12/22/2022 0908   MCHC 33.5 05/05/2018 0042   RDW 12.8 12/22/2022 0908   LYMPHSABS 2.4 12/22/2022 0908   MONOABS 0.6 02/09/2017 1727   EOSABS 0.3 12/22/2022 0908   BASOSABS 0.0 12/22/2022 0908   No results for input(s): HGB in the last 8760 hours.  CMP     Component Value Date/Time   NA 138 12/18/2023 0900   K 4.4 12/18/2023 0900   CL 100 12/18/2023 0900   CO2  26 12/18/2023 0900   GLUCOSE 174 (H) 12/18/2023 0900   GLUCOSE 134 (H) 05/05/2018 0042   BUN 15 12/18/2023 0900   CREATININE 0.99 12/18/2023 0900   CALCIUM  9.4 12/18/2023 0900   PROT 6.8 12/18/2023 0900   ALBUMIN 4.8 12/18/2023 0900   AST 21 12/18/2023 0900   ALT 34 12/18/2023 0900   ALKPHOS 53 12/18/2023 0900   BILITOT 1.1 12/18/2023  0900   GFRNONAA 92 05/14/2020 0951   GFRAA 107 05/14/2020 0951      Latest Ref Rng & Units 12/18/2023    9:00 AM 08/25/2023    8:36 AM 12/22/2022    9:08 AM  Hepatic Function  Total Protein 6.0 - 8.5 g/dL 6.8  7.1  6.7   Albumin 3.8 - 4.9 g/dL 4.8  5.0  4.5   AST 0 - 40 IU/L 21  23  17    ALT 0 - 44 IU/L 34  31  25   Alk Phosphatase 44 - 121 IU/L 53  44  49   Total Bilirubin 0.0 - 1.2 mg/dL 1.1  1.1  0.7       Current Medications:   Current Outpatient Medications (Endocrine & Metabolic):    JARDIANCE  25 MG TABS tablet, TAKE 1 TABLET BY MOUTH DAILY BEFORE BREAKFAST.   metFORMIN  (GLUCOPHAGE -XR) 500 MG 24 hr tablet, Take 1 tablet (500 mg total) by mouth daily with breakfast.  Current Outpatient Medications (Cardiovascular):    atorvastatin  (LIPITOR) 10 MG tablet, TAKE 1 TABLET BY MOUTH EVERYDAY AT BEDTIME   omega-3 acid ethyl esters (LOVAZA ) 1 g capsule, Take 2 capsules (2 g total) by mouth 2 (two) times daily.     Current Outpatient Medications (Other):    Blood Glucose Monitoring Suppl DEVI, 1 each by Does not apply route in the morning, at noon, and at bedtime. May substitute to any manufacturer covered by patient's insurance.   Continuous Glucose Sensor (FREESTYLE LIBRE 3 SENSOR) MISC, Place 1 sensor on the skin every 14 days. Use to check glucose continuously   diclofenac  Sodium (VOLTAREN  ARTHRITIS PAIN) 1 % GEL, Apply 2 g topically 4 (four) times daily as needed (pain). Apply to right knee and neck, shoulders as needed up to 4x daily   DULoxetine  (CYMBALTA ) 30 MG capsule, TAKE 1 CAPSULE BY MOUTH TWICE A DAY (INS. LIMIT 30 CAPSULES)   Lancets MISC, Use lancets to check blood sugar daily.   Multiple Vitamin (MULTIVITAMIN) tablet, Take 1 tablet by mouth daily.   TURMERIC PO, Take 1 tablet by mouth daily.   zolpidem  (AMBIEN ) 10 MG tablet, TAKE 1 TABLET BY MOUTH EVERY DAY AT BEDTIME AS NEEDED  Medical History:  Past Medical History:  Diagnosis Date   Anemia    as child     Anxiety    uses valium  for anxiety related to pain.    Arthritis    stenosis, cervical area, arthritis  - spine    Diabetes mellitus without complication (HCC)    treated with diet only- undercontrol per pt    History of exercise stress test     in [place] state 20 yrs[place] ago had stress test & he was followed by cardiologist for a couple yrs. , but hasn't been referred since he has lived here.    History of kidney stones    found incidentally - no problems, just stable    Hyperlipidemia    borderline no meds   Sleep concern    study done in [place], told that it was  normal, sleep issue related to pain.     Allergies:  Allergies  Allergen Reactions   Trazodone And Nefazodone Other (See Comments)    Passes out     Surgical History:  He  has a past surgical history that includes Back surgery (2012 ); Back surgery (2016); Appendectomy (1984); Anterior cervical decomp/discectomy fusion (N/A, 09/01/2016); and Colonoscopy (2008). Family History:  His family history includes Diabetes in his brother, brother, mother, and sister.  REVIEW OF SYSTEMS  : All other systems reviewed and negative except where noted in the History of Present Illness.  PHYSICAL EXAM: There were no vitals taken for this visit. Physical Exam          Alan JONELLE Coombs, PA-C 7:41 AM

## 2024-07-02 ENCOUNTER — Other Ambulatory Visit: Payer: Self-pay

## 2024-07-02 DIAGNOSIS — G894 Chronic pain syndrome: Secondary | ICD-10-CM

## 2024-07-15 ENCOUNTER — Other Ambulatory Visit: Payer: Self-pay

## 2024-07-15 DIAGNOSIS — M4316 Spondylolisthesis, lumbar region: Secondary | ICD-10-CM

## 2024-07-15 DIAGNOSIS — M5459 Other low back pain: Secondary | ICD-10-CM

## 2024-07-15 DIAGNOSIS — Z981 Arthrodesis status: Secondary | ICD-10-CM

## 2024-07-25 ENCOUNTER — Other Ambulatory Visit

## 2024-07-26 ENCOUNTER — Ambulatory Visit
Admission: RE | Admit: 2024-07-26 | Discharge: 2024-07-26 | Disposition: A | Discharge status: Home / Self Care | Payer: Worker's Compensation | Source: Ambulatory Visit

## 2024-07-26 DIAGNOSIS — M4316 Spondylolisthesis, lumbar region: Secondary | ICD-10-CM

## 2024-07-26 DIAGNOSIS — Z981 Arthrodesis status: Secondary | ICD-10-CM

## 2024-07-26 DIAGNOSIS — M5459 Other low back pain: Secondary | ICD-10-CM

## 2024-08-01 ENCOUNTER — Other Ambulatory Visit: Payer: Self-pay | Admitting: Family Medicine

## 2024-08-01 ENCOUNTER — Telehealth: Payer: Self-pay

## 2024-08-01 DIAGNOSIS — E1169 Type 2 diabetes mellitus with other specified complication: Secondary | ICD-10-CM

## 2024-08-01 DIAGNOSIS — G894 Chronic pain syndrome: Secondary | ICD-10-CM

## 2024-08-01 MED ORDER — LANCETS MISC: 3 refills | Status: AC

## 2024-08-01 MED ORDER — DULOXETINE HCL 30 MG PO CPEP: 30.0000 mg | ORAL_CAPSULE | Freq: Two times a day (BID) | ORAL | 4 refills | Status: DC

## 2024-08-01 MED ORDER — FREESTYLE LIBRE 3 SENSOR MISC: 2 refills | Status: DC

## 2024-08-01 MED ORDER — DICLOFENAC SODIUM 1 % EX GEL: 2.0000 g | Freq: Four times a day (QID) | CUTANEOUS | 2 refills | Status: AC | PRN

## 2024-08-01 NOTE — Telephone Encounter (Signed)
 Pt informed of below.

## 2024-08-01 NOTE — Telephone Encounter (Signed)
 Copied from CRM #[id number]. Topic: Clinical - Medication Refill >> Aug 01, 2024  1:33 PM Antony S wrote: Medication: diclofenac  Sodium (VOLTAREN  ARTHRITIS PAIN) 1 % GEL  DULoxetine  (CYMBALTA ) 30 MG capsule Accucheck guide me test strips Lancets MISC   Has the patient contacted their pharmacy? Yes (Agent: If no, request that the patient contact the pharmacy for the refill. If patient does not wish to contact the pharmacy document the reason why and proceed with request.) (Agent: If yes, when and what did the pharmacy advise?)  This is the patient's preferred pharmacy:  [facility] [phone] [address], [place] - [address] [place] [address] Phone: [phone] Fax: [phone]  Is this the correct pharmacy for this prescription? Yes If no, delete pharmacy and type the correct one.   Has the prescription been filled recently? No  Is the patient out of the medication? No  Has the patient been seen for an appointment in the last year OR does the patient have an upcoming appointment? No  Can we respond through MyChart? Yes  Agent: Please be advised that Rx refills may take up to 3 business days. We ask that you follow-up with your pharmacy.

## 2024-08-01 NOTE — Telephone Encounter (Signed)
 Copied from CRM #[id number]. Topic: Clinical - Request for Lab/Test Order >> Aug 01, 2024  1:33 PM Antony RAMAN wrote: Reason for CRM: wants labwork done before appt

## 2024-08-01 NOTE — Telephone Encounter (Signed)
 His appointment is in 2 days and I don't think we have any lab openings tomorrow, so even if he got labs before the appointment they are unlikely to result by the time of his appointment on Wednesday.  His previous labs were normal for the most part so the only lab I would need is a A1c, which we can get while he's at his appointment.

## 2024-08-03 ENCOUNTER — Ambulatory Visit: Payer: Self-pay | Admitting: Family Medicine

## 2024-08-03 ENCOUNTER — Encounter: Payer: Self-pay | Admitting: Family Medicine

## 2024-08-03 VITALS — BP 104/69 | HR 71 | Ht 65.0 in | Wt 141.4 lb

## 2024-08-03 DIAGNOSIS — E1169 Type 2 diabetes mellitus with other specified complication: Secondary | ICD-10-CM | POA: Diagnosis not present

## 2024-08-03 DIAGNOSIS — Z7984 Long term (current) use of oral hypoglycemic drugs: Secondary | ICD-10-CM | POA: Diagnosis not present

## 2024-08-03 DIAGNOSIS — M533 Sacrococcygeal disorders, not elsewhere classified: Secondary | ICD-10-CM | POA: Diagnosis not present

## 2024-08-03 DIAGNOSIS — G894 Chronic pain syndrome: Secondary | ICD-10-CM

## 2024-08-03 LAB — POCT GLYCOSYLATED HEMOGLOBIN (HGB A1C): HbA1c POC (<> result, manual entry): 6.8 % (ref 4.0–5.6)

## 2024-08-03 NOTE — Assessment & Plan Note (Signed)
 Patient reports good control with the addition of metformin  at night. - A1c 6.8%.  no changes  - Continue current medications.  Taking jardiance  and metformin  at night.

## 2024-08-03 NOTE — Progress Notes (Signed)
 Acute Office Visit  Subjective:     Patient ID: TULLIO CHAUSSE, male    DOB: 17-Mar-1966, 58 y.o.   MRN: [id number]  Chief Complaint  Patient presents with   Tailbone Pain    HPI Patient is in today for  Subjective - Coccydynia: Reports onset of tailbone pain approximately 2 weeks ago. Unsure of inciting event, denies fall or trauma. Pain exacerbated by prolonged sitting, such as waiting for 40 minutes for pizza, which significantly worsened the pain the next day. The pain is localized to the tailbone and is tender to palpation, mostly on the left side. Reports the pain improves when avoiding sitting but has not resolved completely. Has ordered a donut cushion for relief. Expressed concern if this is related to ongoing back issues. Notes recent weight loss over the past 5 years and wonders if loss of gluteal muscle/cushioning is a factor.  Medications Current medications are unchanged. Added metformin  one pill at night since last visit, which has been effective for blood sugar control.  PMH, PSH, FH, Social Hx PMHx: Low back pain with radicular symptoms (tingling, legs falling asleep), history of L5-S1 surgery. Possible need for further back surgery in [place]  under worker's compensation ([employer]). PSHx: L5-S1 surgery.  ROS Constitutional: Denies fever. Integumentary: Denies rash, drainage, or skin changes in the coccygeal area. MSK: Reports low back pain with tingling and sensation of legs falling asleep.  ROS      Objective:    BP 104/69   Pulse 71   Ht 5' 5 (1.651 m)   Wt 141 lb 6.4 oz (64.1 kg)   SpO2 98%   BMI 23.53 kg/m    Physical Exam Gen: alert, oriented.  Standing during encounter Pulm: no respiratory distress Psych: pleasant affect MSK: Point tenderness over the coccyx. No signs of infection, abscess, or pilonidal cyst. No rash or skin changes.  Results for orders placed or performed in visit on 08/03/24  POCT glycosylated hemoglobin (Hb A1C)   Result Value Ref Range   Hemoglobin A1C     HbA1c POC (<> result, manual entry) 6.8 4.0 - 5.6 %   HbA1c, POC (prediabetic range)     HbA1c, POC (controlled diabetic range)          Assessment & Plan:   Coccydynia Assessment & Plan: Patient presents with a two-week history of tailbone pain, worsened by sitting, without a clear traumatic cause. Examination reveals localized tenderness over the coccyx without signs of abscess, cyst, or skin changes. The presentation is consistent with coccydynia, likely inflammatory or arthritic in nature. Differential includes fracture, but the focus is on conservative management. - Refer to physical therapy to work on stretching and strengthening. - Advised to use a cushion to relieve pressure when sitting. - Discussed use of topical analgesics like Icy Hot, with caution around mucosal areas. - Discussed other potential treatments if conservative measures fail, including steroid injections and shockwave therapy, noting these are typically considered after a trial of conservative care.   Orders: -     Ambulatory referral to Physical Therapy  Chronic pain associated with significant psychosocial dysfunction Assessment & Plan: Ongoing issue managed by a specialist in [place]  under worker's compensation. Recent MRI was performed locally. Patient reports worsening symptoms over the last two years, now with tingling and legs falling asleep, similar to symptoms prior to previous L5-S1 surgery. Discussed uncertainty regarding worker's comp coverage for surgery in this state. - Advised to contact worker's occupational hygienist or the insurance  company ([employer]) to clarify rules regarding out-of-state surgery.   Type 2 diabetes mellitus with other specified complication, without long-term current use of insulin (HCC) Assessment & Plan: Patient reports good control with the addition of metformin  at night. - A1c 6.8%.  no changes  - Continue current medications.   Taking jardiance  and metformin  at night.    Orders: -     POCT glycosylated hemoglobin (Hb A1C)     Return in about 3 months (around 11/03/2024) for DM.  Toribio MARLA Slain, MD

## 2024-08-03 NOTE — Assessment & Plan Note (Addendum)
 Patient presents with a two-week history of tailbone pain, worsened by sitting, without a clear traumatic cause. Examination reveals localized tenderness over the coccyx without signs of abscess, cyst, or skin changes. The presentation is consistent with coccydynia, likely inflammatory or arthritic in nature. Differential includes fracture, but the focus is on conservative management. - Refer to physical therapy to work on stretching and strengthening. - Advised to use a cushion to relieve pressure when sitting. - Discussed use of topical analgesics like Icy Hot, with caution around mucosal areas. - Discussed other potential treatments if conservative measures fail, including steroid injections and shockwave therapy, noting these are typically considered after a trial of conservative care.

## 2024-08-03 NOTE — Assessment & Plan Note (Signed)
 Ongoing issue managed by a specialist in [place]  under worker's compensation. Recent MRI was performed locally. Patient reports worsening symptoms over the last two years, now with tingling and legs falling asleep, similar to symptoms prior to previous L5-S1 surgery. Discussed uncertainty regarding worker's comp coverage for surgery in this state. - Advised to contact worker's Occupational hygienist or [employer] ([employer]) to clarify rules regarding out-of-state surgery.

## 2024-08-03 NOTE — Patient Instructions (Addendum)
 It was nice to see you today,  We addressed the following topics today: - Please use the cushion you ordered to help relieve pressure on your tailbone when you sit. - You can use topical pain relievers like Icy Hot on the area, but be careful to avoid sensitive skin. - The physical therapy clinic will call you to schedule an appointment. It is important to attend if you want to see improvement. I have sent it to resolve physical therapy in [place] - I would like you to follow up with your worker's compensation representatives in [place]  regarding potential surgery.  Have a great day,  Rolan Slain, MD

## 2024-08-12 ENCOUNTER — Other Ambulatory Visit: Payer: Self-pay | Admitting: Family Medicine

## 2024-08-12 DIAGNOSIS — G4700 Insomnia, unspecified: Secondary | ICD-10-CM

## 2024-08-26 ENCOUNTER — Other Ambulatory Visit: Payer: Self-pay | Admitting: Family Medicine

## 2024-08-26 DIAGNOSIS — G894 Chronic pain syndrome: Secondary | ICD-10-CM

## 2024-08-26 NOTE — Telephone Encounter (Signed)
 Copied from CRM [id number]. Topic: Clinical - Prescription Issue >> Aug 26, 2024  4:01 PM Wess RAMAN wrote: Reason for CRM: Patient would like the 90 day supply of DULoxetine  (CYMBALTA ) 30 MG capsule  Callback #: [id number]  Pharmacy: [id number] [address] [place], [place] - [address] [place] [id number] Phone: [phone] Fax: [phone] Hours: Not open 24 hours

## 2024-08-29 MED ORDER — DULOXETINE HCL 30 MG PO CPEP: 30.0000 mg | ORAL_CAPSULE | Freq: Two times a day (BID) | ORAL | 4 refills | Status: DC

## 2024-09-06 ENCOUNTER — Ambulatory Visit: Payer: Self-pay

## 2024-09-06 NOTE — Telephone Encounter (Signed)
 FYI Only or Action Required?: FYI only for provider: [facility].  Patient was last seen in primary care on 08/03/2024 by Richard Toribio POUR, MD.  Called Nurse Triage reporting Neck Pain.  Symptoms began yesterday.  Interventions attempted: OTC medications: ibuprofen with minimal relief.  Symptoms are: gradually worsening.  Triage Disposition: See HCP Within 4 Hours (Or PCP Triage)  Patient/caregiver understands and will follow disposition?: Yes        Copied from CRM #[id number]. Topic: Clinical - Red Word Triage >> Sep 06, 2024  8:35 AM Richard Noble wrote: Richard Noble that prompted transfer to Nurse Triage: Severe neck pain and stiffness since yesterday unable to sleep. Reason for Disposition  [1] SEVERE neck pain (e.g., excruciating, unable to do any normal activities) AND [2] not improved after 2 hours of pain medicine    [facility] offered appt at alternate [facility] PCP, but pt declined d/t distance. Pt reports he will go to [facility] instead.  Answer Assessment - Initial Assessment Questions 1. ONSET: When did the pain begin?      yesterday 2. LOCATION: Where does it hurt?      Nob[place] side of neck, behind ear 3. PATTERN Does the pain come and go, or has it been constant since it started?      constant 4. SEVERITY: How bad is the pain?  (Scale 0-10; or none or slight stiffness, mild, moderate, severe)     Worse with movement 10/10 5. RADIATION: Does the pain go anywhere else, shoot into your arms?     denies 6. CORD SYMPTOMS: Any weakness or numbness of the arms or legs?     denies 7. CAUSE: What do you think is causing the neck pain?     Chronic neck issues r/t back surgeries 8. NECK OVERUSE: Any recent activities that involved turning or twisting the neck?     denies 9. OTHER SYMPTOMS: Do you have any other symptoms? (e.g., headache, fever, chest pain, difficulty breathing, neck swelling)     denies 10. PREGNANCY: Is there any chance you are pregnant? When was  your last menstrual period?       N/a  Protocols used: Neck Pain or Stiffness-A-AH

## 2024-10-24 ENCOUNTER — Other Ambulatory Visit: Payer: Self-pay | Admitting: Family Medicine

## 2024-10-24 DIAGNOSIS — E1169 Type 2 diabetes mellitus with other specified complication: Secondary | ICD-10-CM

## 2024-10-31 ENCOUNTER — Other Ambulatory Visit

## 2024-10-31 DIAGNOSIS — E1169 Type 2 diabetes mellitus with other specified complication: Secondary | ICD-10-CM

## 2024-11-01 ENCOUNTER — Ambulatory Visit: Payer: Self-pay

## 2024-11-01 LAB — LIPID PANEL
Chol/HDL Ratio: 4.3 ratio (ref 0.0–5.0)
Cholesterol, Total: 138 mg/dL (ref 100–199)
HDL: 32 mg/dL — ABNORMAL LOW
LDL Chol Calc (NIH): 69 mg/dL (ref 0–99)
Triglycerides: 223 mg/dL — ABNORMAL HIGH (ref 0–149)
VLDL Cholesterol Cal: 37 mg/dL (ref 5–40)

## 2024-11-01 LAB — COMPREHENSIVE METABOLIC PANEL WITH GFR
ALT: 25 IU/L (ref 0–44)
AST: 15 IU/L (ref 0–40)
Albumin: 4.4 g/dL (ref 3.8–4.9)
Alkaline Phosphatase: 56 IU/L (ref 47–123)
BUN/Creatinine Ratio: 20 (ref 9–20)
BUN: 16 mg/dL (ref 6–24)
Bilirubin Total: 1 mg/dL (ref 0.0–1.2)
CO2: 23 mmol/L (ref 20–29)
Calcium: 9 mg/dL (ref 8.7–10.2)
Chloride: 102 mmol/L (ref 96–106)
Creatinine, Ser: 0.8 mg/dL (ref 0.76–1.27)
Globulin, Total: 2.2 g/dL (ref 1.5–4.5)
Glucose: 202 mg/dL — ABNORMAL HIGH (ref 70–99)
Potassium: 4 mmol/L (ref 3.5–5.2)
Sodium: 139 mmol/L (ref 134–144)
Total Protein: 6.6 g/dL (ref 6.0–8.5)
eGFR: 103 mL/min/1.73

## 2024-11-01 LAB — MICROALBUMIN / CREATININE URINE RATIO
Creatinine, Urine: 84.8 mg/dL
Microalb/Creat Ratio: 15 mg/g{creat} (ref 0–29)
Microalbumin, Urine: 12.3 ug/mL

## 2024-11-01 LAB — HEMOGLOBIN A1C
Est. average glucose Bld gHb Est-mCnc: 163 mg/dL
Hgb A1c MFr Bld: 7.3 % — ABNORMAL HIGH (ref 4.8–5.6)

## 2024-11-03 ENCOUNTER — Other Ambulatory Visit: Payer: Self-pay | Admitting: Family Medicine

## 2024-11-03 DIAGNOSIS — G894 Chronic pain syndrome: Secondary | ICD-10-CM

## 2024-11-04 ENCOUNTER — Other Ambulatory Visit: Payer: Self-pay | Admitting: *Deleted

## 2024-11-04 DIAGNOSIS — G4700 Insomnia, unspecified: Secondary | ICD-10-CM

## 2024-11-04 MED ORDER — EMPAGLIFLOZIN 25 MG PO TABS: 25.0000 mg | ORAL_TABLET | Freq: Every day | ORAL | 1 refills | Status: AC

## 2024-11-04 NOTE — Telephone Encounter (Signed)
 Pt came to office and requested refill on the pended medication. Please fill if appropriate.

## 2024-11-06 ENCOUNTER — Other Ambulatory Visit: Payer: Self-pay

## 2024-11-07 ENCOUNTER — Encounter: Payer: Self-pay | Admitting: Family Medicine

## 2024-11-07 ENCOUNTER — Telehealth: Admitting: Family Medicine

## 2024-11-07 VITALS — Ht 65.0 in | Wt 141.0 lb

## 2024-11-07 DIAGNOSIS — E1169 Type 2 diabetes mellitus with other specified complication: Secondary | ICD-10-CM | POA: Diagnosis not present

## 2024-11-07 DIAGNOSIS — Z7984 Long term (current) use of oral hypoglycemic drugs: Secondary | ICD-10-CM

## 2024-11-07 DIAGNOSIS — M5136 Other intervertebral disc degeneration, lumbar region with discogenic back pain only: Secondary | ICD-10-CM | POA: Diagnosis not present

## 2024-11-07 MED ORDER — ACCU-CHEK GUIDE TEST VI STRP: 1.0000 | ORAL_STRIP | Freq: Four times a day (QID) | 12 refills | Status: AC

## 2024-11-07 NOTE — Assessment & Plan Note (Signed)
 Worsening symptoms likely due to osteophyte at level four. Pain managed with ibuprofen and Tylenol . Scheduled for back surgeon evaluation and MRI review. - Continue alternating ibuprofen and Tylenol  for pain management. - Follow up with back surgeon on the 12th for MRI review.

## 2024-11-07 NOTE — Progress Notes (Signed)
" ° ° °  Subjective   Patient ID: Richard Noble, male    DOB: 1966-03-01  Age: 59 y.o. MRN: [id number]  No chief complaint on file.   Pt location: home Provider location: home Method: video Duration: 20 min  History of Present Illness   Richard Noble is a 59 year old male with diabetes who presents for follow-up on blood sugar management and back pain.  His back pain has recently worsened. The pain is in the coccyx or tailbone, comes and goes, and is currently much better. He has been limiting sitting, which helps. He plans to discuss this pain with his back surgeon.  He alternates ibuprofen and Tylenol  for pain. He takes ibuprofen 200 mg, one pill in the morning and one at night, which improves his discomfort.  His A1c was slightly higher than last month. He attributes this to eating sweets during the holidays. He continues metformin , one tablet daily, with no medication changes. He needs test strips for his AccuCheck Guide Me meter and has the meter and lancets at home.  He knows his HDL is low and his triglycerides are elevated, which may be affected by not fasting before the lab test. Recent labs showed normal liver and kidney function.          The ASCVD Risk score (Arnett DK, et al., 2019) failed to calculate for the following reasons:   The systolic blood pressure is missing  Health Maintenance Due  Topic Date Due   Hepatitis B Vaccines 19-59 Average Risk (1 of 3 - 19+ 3-dose series) Never done   OPHTHALMOLOGY EXAM  10/10/2021   Influenza Vaccine  05/13/2024   COVID-19 Vaccine (5 - 2025-26 season) 06/13/2024      Objective:     Ht 5' 5 (1.651 m)   Wt 141 lb (64 kg)   BMI 23.46 kg/m    Physical Exam     Gen: alert, oriented Pulm: no respiratory distress Psych: pleasant affect       No results found for any visits on 11/07/24.      Assessment & Plan:   Degeneration of intervertebral disc of lumbar region with discogenic back pain Assessment &  Plan: Worsening symptoms likely due to osteophyte at level four. Pain managed with ibuprofen and Tylenol . Scheduled for back surgeon evaluation and MRI review. - Continue alternating ibuprofen and Tylenol  for pain management. - Follow up with back surgeon on the 12th for MRI review.    Type 2 diabetes mellitus with other specified complication, without long-term current use of insulin (HCC) Assessment & Plan: Type 2 diabetes mellitus with other specified complication A1c slightly elevated, likely due to increased sugar intake. Current management with metformin  remains unchanged. Prefers simple glucometer due to cost concerns. - Continue metformin  one tablet daily. - Recheck A1c in three months. - Provided AccuCheck test strips for glucometer.   Other orders -     Accu-Chek Guide Test; 1 each by Other route 4 (four) times daily. Use as instructed  Dispense: 100 each; Refill: 12     No follow-ups on file.    Toribio MARLA Slain, MD  "

## 2024-11-07 NOTE — Assessment & Plan Note (Signed)
 Type 2 diabetes mellitus with other specified complication A1c slightly elevated, likely due to increased sugar intake. Current management with metformin  remains unchanged. Prefers simple glucometer due to cost concerns. - Continue metformin  one tablet daily. - Recheck A1c in three months. - Provided AccuCheck test strips for glucometer.

## 2025-02-06 ENCOUNTER — Ambulatory Visit: Admitting: Family Medicine
